# Patient Record
Sex: Female | Born: 1935 | Race: White | Hispanic: No | Marital: Married | Smoking: Never smoker
Health system: Southern US, Community
[De-identification: ages and names within clinical notes are randomized; demographics above are authoritative.]

## PROBLEM LIST (undated history)

## (undated) DIAGNOSIS — J449 Chronic obstructive pulmonary disease, unspecified: Secondary | ICD-10-CM

## (undated) DIAGNOSIS — F419 Anxiety disorder, unspecified: Secondary | ICD-10-CM

## (undated) DIAGNOSIS — J45909 Unspecified asthma, uncomplicated: Secondary | ICD-10-CM

## (undated) DIAGNOSIS — H9191 Unspecified hearing loss, right ear: Secondary | ICD-10-CM

## (undated) DIAGNOSIS — D333 Benign neoplasm of cranial nerves: Secondary | ICD-10-CM

## (undated) DIAGNOSIS — K219 Gastro-esophageal reflux disease without esophagitis: Secondary | ICD-10-CM

## (undated) DIAGNOSIS — S32000A Wedge compression fracture of unspecified lumbar vertebra, initial encounter for closed fracture: Secondary | ICD-10-CM

## (undated) DIAGNOSIS — Z66 Do not resuscitate: Secondary | ICD-10-CM

## (undated) DIAGNOSIS — M1990 Unspecified osteoarthritis, unspecified site: Secondary | ICD-10-CM

## (undated) DIAGNOSIS — I2510 Atherosclerotic heart disease of native coronary artery without angina pectoris: Secondary | ICD-10-CM

## (undated) DIAGNOSIS — I5042 Chronic combined systolic (congestive) and diastolic (congestive) heart failure: Secondary | ICD-10-CM

## (undated) DIAGNOSIS — E7800 Pure hypercholesterolemia, unspecified: Secondary | ICD-10-CM

## (undated) DIAGNOSIS — I219 Acute myocardial infarction, unspecified: Secondary | ICD-10-CM

## (undated) DIAGNOSIS — I10 Essential (primary) hypertension: Secondary | ICD-10-CM

## (undated) DIAGNOSIS — Z9289 Personal history of other medical treatment: Secondary | ICD-10-CM

## (undated) DIAGNOSIS — D510 Vitamin B12 deficiency anemia due to intrinsic factor deficiency: Secondary | ICD-10-CM

## (undated) DIAGNOSIS — N3020 Other chronic cystitis without hematuria: Secondary | ICD-10-CM

## (undated) DIAGNOSIS — R51 Headache: Secondary | ICD-10-CM

## (undated) DIAGNOSIS — J42 Unspecified chronic bronchitis: Secondary | ICD-10-CM

## (undated) DIAGNOSIS — J189 Pneumonia, unspecified organism: Secondary | ICD-10-CM

## (undated) HISTORY — PX: CHOLECYSTECTOMY: SHX55

## (undated) HISTORY — DX: Atherosclerotic heart disease of native coronary artery without angina pectoris: I25.10

## (undated) HISTORY — PX: VESICOVAGINAL FISTULA CLOSURE W/ TAH: SUR271

## (undated) HISTORY — DX: Unspecified asthma, uncomplicated: J45.909

## (undated) HISTORY — DX: Chronic combined systolic (congestive) and diastolic (congestive) heart failure: I50.42

## (undated) HISTORY — PX: APPENDECTOMY: SHX54

## (undated) HISTORY — PX: CATARACT EXTRACTION, BILATERAL: SHX1313

## (undated) HISTORY — PX: INGUINAL HERNIA REPAIR: SUR1180

---
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
## 1959-07-09 HISTORY — PX: ABDOMINAL SURGERY: SHX537

## 1979-07-09 HISTORY — PX: BREAST BIOPSY: SHX20

## 1979-07-09 HISTORY — PX: KNEE ARTHROSCOPY: SHX127

## 1989-07-08 HISTORY — PX: FEMORAL-POPLITEAL BYPASS GRAFT: SHX937

## 1998-04-07 ENCOUNTER — Other Ambulatory Visit: Admission: RE | Admit: 1998-04-07 | Discharge: 1998-04-07 | Payer: Self-pay | Admitting: Gastroenterology

## 1998-04-08 ENCOUNTER — Ambulatory Visit (HOSPITAL_COMMUNITY): Admission: RE | Admit: 1998-04-08 | Discharge: 1998-04-08 | Payer: Self-pay | Admitting: Obstetrics and Gynecology

## 1998-07-30 ENCOUNTER — Other Ambulatory Visit: Admission: RE | Admit: 1998-07-30 | Discharge: 1998-07-30 | Payer: Self-pay | Admitting: *Deleted

## 1999-08-04 ENCOUNTER — Other Ambulatory Visit: Admission: RE | Admit: 1999-08-04 | Discharge: 1999-08-04 | Payer: Self-pay | Admitting: Obstetrics and Gynecology

## 2000-01-10 ENCOUNTER — Encounter: Admission: RE | Admit: 2000-01-10 | Discharge: 2000-01-10 | Payer: Self-pay | Admitting: Gastroenterology

## 2000-01-10 ENCOUNTER — Encounter: Payer: Self-pay | Admitting: Gastroenterology

## 2000-05-05 ENCOUNTER — Encounter: Admission: RE | Admit: 2000-05-05 | Discharge: 2000-05-05 | Payer: Self-pay | Admitting: Obstetrics and Gynecology

## 2000-05-05 ENCOUNTER — Encounter: Payer: Self-pay | Admitting: Obstetrics and Gynecology

## 2000-08-14 ENCOUNTER — Other Ambulatory Visit: Admission: RE | Admit: 2000-08-14 | Discharge: 2000-08-14 | Payer: Self-pay | Admitting: Obstetrics and Gynecology

## 2000-11-14 ENCOUNTER — Encounter: Payer: Self-pay | Admitting: Gastroenterology

## 2000-11-14 ENCOUNTER — Encounter: Admission: RE | Admit: 2000-11-14 | Discharge: 2000-11-14 | Payer: Self-pay | Admitting: Gastroenterology

## 2001-05-31 ENCOUNTER — Encounter: Payer: Self-pay | Admitting: Gastroenterology

## 2001-05-31 ENCOUNTER — Encounter: Admission: RE | Admit: 2001-05-31 | Discharge: 2001-05-31 | Payer: Self-pay | Admitting: Gastroenterology

## 2001-09-21 ENCOUNTER — Other Ambulatory Visit: Admission: RE | Admit: 2001-09-21 | Discharge: 2001-09-21 | Payer: Self-pay | Admitting: Obstetrics and Gynecology

## 2002-02-07 ENCOUNTER — Ambulatory Visit (HOSPITAL_COMMUNITY): Admission: RE | Admit: 2002-02-07 | Discharge: 2002-02-07 | Payer: Self-pay | Admitting: Gastroenterology

## 2002-05-04 ENCOUNTER — Emergency Department (HOSPITAL_COMMUNITY): Admission: EM | Admit: 2002-05-04 | Discharge: 2002-05-04 | Payer: Self-pay

## 2002-05-09 ENCOUNTER — Encounter: Admission: RE | Admit: 2002-05-09 | Discharge: 2002-05-09 | Payer: Self-pay | Admitting: Gastroenterology

## 2002-05-09 ENCOUNTER — Encounter: Payer: Self-pay | Admitting: Gastroenterology

## 2002-05-28 ENCOUNTER — Encounter: Admission: RE | Admit: 2002-05-28 | Discharge: 2002-05-28 | Payer: Self-pay | Admitting: Gastroenterology

## 2002-05-28 ENCOUNTER — Encounter: Payer: Self-pay | Admitting: Gastroenterology

## 2002-06-12 ENCOUNTER — Encounter: Payer: Self-pay | Admitting: Surgery

## 2002-06-12 ENCOUNTER — Encounter: Admission: RE | Admit: 2002-06-12 | Discharge: 2002-06-12 | Payer: Self-pay | Admitting: Surgery

## 2002-07-24 ENCOUNTER — Ambulatory Visit (HOSPITAL_COMMUNITY): Admission: RE | Admit: 2002-07-24 | Discharge: 2002-07-24 | Payer: Self-pay | Admitting: Gastroenterology

## 2003-06-04 ENCOUNTER — Encounter: Admission: RE | Admit: 2003-06-04 | Discharge: 2003-06-04 | Payer: Self-pay | Admitting: Gastroenterology

## 2003-06-04 ENCOUNTER — Encounter: Payer: Self-pay | Admitting: Gastroenterology

## 2003-10-18 ENCOUNTER — Emergency Department (HOSPITAL_COMMUNITY): Admission: EM | Admit: 2003-10-18 | Discharge: 2003-10-18 | Payer: Self-pay | Admitting: *Deleted

## 2003-12-17 ENCOUNTER — Encounter: Admission: RE | Admit: 2003-12-17 | Discharge: 2003-12-17 | Payer: Self-pay | Admitting: Gastroenterology

## 2004-02-03 ENCOUNTER — Other Ambulatory Visit: Admission: RE | Admit: 2004-02-03 | Discharge: 2004-02-03 | Payer: Self-pay | Admitting: Obstetrics and Gynecology

## 2004-06-30 ENCOUNTER — Encounter: Admission: RE | Admit: 2004-06-30 | Discharge: 2004-06-30 | Payer: Self-pay | Admitting: Gastroenterology

## 2005-02-24 ENCOUNTER — Other Ambulatory Visit: Admission: RE | Admit: 2005-02-24 | Discharge: 2005-02-24 | Payer: Self-pay | Admitting: Obstetrics and Gynecology

## 2005-05-16 ENCOUNTER — Observation Stay (HOSPITAL_COMMUNITY): Admission: RE | Admit: 2005-05-16 | Discharge: 2005-05-17 | Payer: Self-pay | Admitting: Urology

## 2005-05-16 HISTORY — PX: BLADDER SUSPENSION: SHX72

## 2005-07-28 ENCOUNTER — Emergency Department (HOSPITAL_COMMUNITY): Admission: EM | Admit: 2005-07-28 | Discharge: 2005-07-28 | Payer: Self-pay | Admitting: Emergency Medicine

## 2005-09-21 ENCOUNTER — Encounter: Admission: RE | Admit: 2005-09-21 | Discharge: 2005-09-21 | Payer: Self-pay | Admitting: Gastroenterology

## 2006-10-05 ENCOUNTER — Encounter: Admission: RE | Admit: 2006-10-05 | Discharge: 2006-10-05 | Payer: Self-pay | Admitting: Gastroenterology

## 2006-12-01 ENCOUNTER — Ambulatory Visit: Payer: Self-pay | Admitting: Pulmonary Disease

## 2006-12-01 LAB — CONVERTED CEMR LAB
Hemoglobin: 12.7 g/dL (ref 12.0–15.0)
MCV: 84 fL (ref 78.0–100.0)
WBC: 5.5 10*3/uL (ref 4.5–10.5)

## 2006-12-04 ENCOUNTER — Ambulatory Visit: Payer: Self-pay | Admitting: Pulmonary Disease

## 2007-08-21 ENCOUNTER — Encounter: Admission: RE | Admit: 2007-08-21 | Discharge: 2007-08-21 | Payer: Self-pay | Admitting: Gastroenterology

## 2007-10-10 ENCOUNTER — Encounter: Admission: RE | Admit: 2007-10-10 | Discharge: 2007-10-10 | Payer: Self-pay | Admitting: Gastroenterology

## 2008-08-23 ENCOUNTER — Observation Stay (HOSPITAL_COMMUNITY): Admission: EM | Admit: 2008-08-23 | Discharge: 2008-08-24 | Payer: Self-pay | Admitting: Emergency Medicine

## 2008-10-13 ENCOUNTER — Encounter: Admission: RE | Admit: 2008-10-13 | Discharge: 2008-10-13 | Payer: Self-pay | Admitting: Gastroenterology

## 2008-11-26 ENCOUNTER — Ambulatory Visit: Payer: Self-pay | Admitting: Vascular Surgery

## 2008-11-26 ENCOUNTER — Encounter (INDEPENDENT_AMBULATORY_CARE_PROVIDER_SITE_OTHER): Payer: Self-pay | Admitting: Gastroenterology

## 2008-11-26 ENCOUNTER — Ambulatory Visit (HOSPITAL_COMMUNITY): Admission: RE | Admit: 2008-11-26 | Discharge: 2008-11-26 | Payer: Self-pay | Admitting: Gastroenterology

## 2009-10-14 ENCOUNTER — Encounter: Admission: RE | Admit: 2009-10-14 | Discharge: 2009-10-14 | Payer: Self-pay | Admitting: Gastroenterology

## 2010-01-04 ENCOUNTER — Encounter: Admission: RE | Admit: 2010-01-04 | Discharge: 2010-01-04 | Payer: Self-pay | Admitting: Gastroenterology

## 2010-03-27 ENCOUNTER — Emergency Department (HOSPITAL_COMMUNITY): Admission: EM | Admit: 2010-03-27 | Discharge: 2010-03-27 | Payer: Self-pay | Admitting: Emergency Medicine

## 2010-11-07 HISTORY — PX: CARDIAC CATHETERIZATION: SHX172

## 2010-12-02 ENCOUNTER — Encounter
Admission: RE | Admit: 2010-12-02 | Discharge: 2010-12-02 | Payer: Self-pay | Source: Home / Self Care | Attending: Gastroenterology | Admitting: Gastroenterology

## 2011-01-24 LAB — CBC
Hemoglobin: 11.7 g/dL — ABNORMAL LOW (ref 12.0–15.0)
MCHC: 34.5 g/dL (ref 30.0–36.0)
MCV: 81.3 fL (ref 78.0–100.0)
WBC: 4.6 10*3/uL (ref 4.0–10.5)

## 2011-01-24 LAB — DIFFERENTIAL
Basophils Absolute: 0 10*3/uL (ref 0.0–0.1)
Basophils Relative: 1 % (ref 0–1)
Eosinophils Relative: 2 % (ref 0–5)
Lymphocytes Relative: 23 % (ref 12–46)
Monocytes Relative: 11 % (ref 3–12)

## 2011-01-24 LAB — BASIC METABOLIC PANEL
BUN: 14 mg/dL (ref 6–23)
Calcium: 10.1 mg/dL (ref 8.4–10.5)
Creatinine, Ser: 0.9 mg/dL (ref 0.4–1.2)
GFR calc Af Amer: >60 mL/min (ref >60)
GFR calc non Af Amer: >60 mL/min (ref >60)
Glucose, Bld: 116 mg/dL — ABNORMAL HIGH (ref 70–99)
Sodium: 135 mEq/L (ref 135–145)

## 2011-02-02 ENCOUNTER — Emergency Department (HOSPITAL_COMMUNITY): Payer: Medicare Other

## 2011-02-02 ENCOUNTER — Observation Stay (HOSPITAL_COMMUNITY)
Admission: EM | Admit: 2011-02-02 | Discharge: 2011-02-03 | Disposition: A | Discharge status: Home / Self Care | Payer: Medicare Other | Attending: Interventional Cardiology | Admitting: Interventional Cardiology

## 2011-02-02 DIAGNOSIS — I2510 Atherosclerotic heart disease of native coronary artery without angina pectoris: Principal | ICD-10-CM | POA: Insufficient documentation

## 2011-02-02 DIAGNOSIS — I70409 Unspecified atherosclerosis of autologous vein bypass graft(s) of the extremities, unspecified extremity: Secondary | ICD-10-CM | POA: Insufficient documentation

## 2011-02-02 DIAGNOSIS — D649 Anemia, unspecified: Secondary | ICD-10-CM | POA: Insufficient documentation

## 2011-02-02 DIAGNOSIS — Z7902 Long term (current) use of antithrombotics/antiplatelets: Secondary | ICD-10-CM | POA: Insufficient documentation

## 2011-02-02 DIAGNOSIS — Z79899 Other long term (current) drug therapy: Secondary | ICD-10-CM | POA: Insufficient documentation

## 2011-02-02 DIAGNOSIS — I10 Essential (primary) hypertension: Secondary | ICD-10-CM | POA: Insufficient documentation

## 2011-02-02 DIAGNOSIS — R0602 Shortness of breath: Secondary | ICD-10-CM | POA: Insufficient documentation

## 2011-02-02 DIAGNOSIS — E7800 Pure hypercholesterolemia, unspecified: Secondary | ICD-10-CM | POA: Insufficient documentation

## 2011-02-02 DIAGNOSIS — E785 Hyperlipidemia, unspecified: Secondary | ICD-10-CM | POA: Insufficient documentation

## 2011-02-02 LAB — BASIC METABOLIC PANEL
Chloride: 99 mEq/L (ref 96–112)
GFR calc Af Amer: >60 mL/min (ref >60)
Potassium: 3.5 mEq/L (ref 3.5–5.1)

## 2011-02-02 LAB — CBC
Hemoglobin: 12.1 g/dL (ref 12.0–15.0)
MCHC: 32.9 g/dL (ref 30.0–36.0)
MCHC: 33.7 g/dL (ref 30.0–36.0)
MCV: 85.5 fL (ref 78.0–100.0)
MCV: 87.3 fL (ref 78.0–100.0)

## 2011-02-02 LAB — POCT CARDIAC MARKERS
CKMB, poc: 1.4 ng/mL (ref 1.0–8.0)
CKMB, poc: <1 ng/mL — ABNORMAL LOW (ref 1.0–8.0)
Myoglobin, poc: 105 ng/mL (ref 12–200)
Myoglobin, poc: 68.3 ng/mL (ref 12–200)
Troponin i, poc: <0.05 ng/mL (ref 0.00–0.09)

## 2011-02-02 LAB — TROPONIN I: Troponin I: 0.01 ng/mL (ref 0.00–0.06)

## 2011-02-02 LAB — CARDIAC PANEL(CRET KIN+CKTOT+MB+TROPI)
Total CK: 241 U/L — ABNORMAL HIGH (ref 7–177)
Troponin I: 0.01 ng/mL (ref 0.00–0.06)

## 2011-02-02 LAB — HEPARIN LEVEL (UNFRACTIONATED): Heparin Unfractionated: 0.51 IU/mL (ref 0.30–0.70)

## 2011-02-02 LAB — CK TOTAL AND CKMB (NOT AT ARMC)
Relative Index: INVALID (ref 0.0–2.5)
Total CK: 98 U/L (ref 7–177)

## 2011-02-03 LAB — BASIC METABOLIC PANEL
BUN: 14 mg/dL (ref 6–23)
Calcium: 10 mg/dL (ref 8.4–10.5)
GFR calc non Af Amer: 56 mL/min — ABNORMAL LOW (ref >60)
Potassium: 3.7 mEq/L (ref 3.5–5.1)
Sodium: 139 mEq/L (ref 135–145)

## 2011-02-03 LAB — PROTIME-INR
INR: 0.87 (ref 0.00–1.49)
Prothrombin Time: 12 seconds (ref 11.6–15.2)

## 2011-02-03 LAB — CBC
HCT: 38.8 % (ref 36.0–46.0)
Hemoglobin: 13 g/dL (ref 12.0–15.0)
RBC: 4.41 MIL/uL (ref 3.87–5.11)
WBC: 5.5 10*3/uL (ref 4.0–10.5)

## 2011-02-03 LAB — HEPARIN LEVEL (UNFRACTIONATED): Heparin Unfractionated: 0.33 IU/mL (ref 0.30–0.70)

## 2011-02-03 LAB — CARDIAC PANEL(CRET KIN+CKTOT+MB+TROPI)
CK, MB: 2.8 ng/mL (ref 0.3–4.0)
Relative Index: 1.5 (ref 0.0–2.5)
Troponin I: 0.02 ng/mL (ref 0.00–0.06)

## 2011-02-23 NOTE — Discharge Summary (Signed)
  NAME:  [name], [name]             ACCOUNT NO.:  [id number]  MEDICAL RECORD NO.:  [id number]           PATIENT TYPE:  O  LOCATION:  6522                         FACILITY:  [facility]  PHYSICIAN:  Corky Crafts, MDDATE OF BIRTH:  07/01/1926  DATE OF ADMISSION:  02/02/2011 DATE OF DISCHARGE:  02/03/2011                              DISCHARGE SUMMARY   PRIMARY CARE PHYSICIAN:  Danise Edge, MD  FINAL DIAGNOSES: 1. Coronary artery disease. 2. Hyperlipidemia.  PROCEDURE PERFORMED:  Left heart catheterization on February 03, 2011.  HOSPITAL COURSE:  The patient was admitted with unstable angina.  She ruled out for MI.  She underwent catheterization which showed a chronically occluded mid LAD.  She had left-to-left and right-to-left collaterals.  She had normal left ventricular function.  Her chest pain resolved.  She was started on Imdur.  Her blood pressure was stable. Her cath was done from the radial approach and she had no bleeding issues.  DISCHARGE MEDICATIONS: 1. Diazepam 5 mg p.o. daily p.r.n. 2. Ranitidine 150 mg p.o. b.i.d. 3. Chlorthalidone 12.5 mg daily. 4. Lovastatin 20 mg daily. 5. Iron 65 mg daily. 6. vitamin D3 1000 international units daily. 7. Imdur 30 mg daily. 8. Sublingual nitroglycerin p.r.n. 9. Aspirin 81 mg daily.  FOLLOWUP:  With Dr. Eldridge Dace in February 28, 2011, at 2 p.m.  Lab work showed BNP of 65, creatinine of 0.95.  FOLLOWUP ACTIVITY:  Followup post radial cath instructions, otherwise, as tolerated.  DIET:  Low-salt, heart-healthy diet.    Corky Crafts, MD    JSV/MEDQ  D:  02/03/2011  T:  02/04/2011  Job:  [id number]  Electronically Signed by Lance Muss MD on 02/23/2011 02:24:23 PM

## 2011-02-23 NOTE — H&P (Signed)
NAME:  [name], [name]             ACCOUNT NO.:  [id number]  MEDICAL RECORD NO.:  [id number]           PATIENT TYPE:  I  LOCATION:  2012                         FACILITY:  [facility]  PHYSICIAN:  Corky Crafts, MDDATE OF BIRTH:  May 03, 1926  DATE OF ADMISSION:  02/02/2011 DATE OF DISCHARGE:                             HISTORY & PHYSICAL   REASON FOR ADMISSION:  Chest discomfort.  HISTORY OF PRESENT ILLNESS:  The patient is an 77 year old woman who has known peripheral vascular disease.  She had a fem-fem bypass many years ago.  She reported a severe pressure in her upper chest, which radiated to the left side of her face and down her left arm that started this morning.  She took two aspirin with some minimal relief.  However, the intensity of the pain increased again, so her husband brought her to the hospital and she has been evaluated in the emergency room.  She has had an ECG, which does not show any acute ischemia.  Her initial cardiac markers were negative.  She received some nitroglycerin and morphine and this helped reduce the intensity of the pain but it is not completely gone at this point.  She has been started on IV heparin.  We are asked to further evaluate.  She has never had pain like this before.  She does some walking at home and this has not brought on chest pain in the past. She does not recall having any type of cardiac evaluation in the past.  PAST MEDICAL HISTORY: 1. Hypercholesterolemia. 2. Hypertension. 3. Thyroid disease. 4. Pernicious anemia. 5. Peripheral vascular disease.  HOME MEDICATIONS: 1. Omeprazole 20 mg daily. 2. Lovastatin 20 mg daily. 3. Vitamin D. 4. Calcium. 5. Flexeril p.r.n. 6. Diazepam p.r.n. 7. Chlorthalidone 25 mg daily.  ALLERGIES:  SULFA DRUGS.  PAST SURGICAL HISTORY:  She has had a hysterectomy, tonsillectomy, appendectomy and fem-fem bypass many years ago.  FAMILY HISTORY:  Significant for peripheral vascular disease as  well as coronary artery disease.  SOCIAL HISTORY:  She does not smoke or drink.  She is married.  Her daughter is a Engineer, civil (consulting).  REVIEW OF SYSTEMS:  Significant for the chest discomfort.  She also had some associated nausea.  No diaphoresis.  No sweating.  No focal weakness.  No rash.  All other systems negative unless mentioned above.  PHYSICAL EXAM:  VITAL SIGNS:  Blood pressure 114/70, heart rate of 18. GENERAL:  She is awake, alert, no apparent distress. HEAD:  Normocephalic, atraumatic. EYES:  Extraocular movements is intact. NECK:  No JVD. CARDIOVASCULAR:  Regular rate and rhythm.  S1, S2. LUNGS:  Clear to auscultation bilaterally. ABDOMEN:  Soft, nontender, nondistended. EXTREMITIES:  No edema. NEURO:  No focal, motor or sensory deficits. VASCULAR:  3+ radial pulse bilaterally, 2+ right femoral pulse, 1+ left femoral pulse.  LABORATORY DATA:  Troponin negative, creatinine 0.86.  Hemoglobin 12.8, white blood cell 5.3, platelets 203.  ECG shows normal sinus rhythm with nonspecific ST-T wave changes diffusely, no significant change from prior ECG done in 2011 chest x-ray shows no active disease.  ASSESSMENT/PLAN:  An 77 year old with known peripheral vascular  disease who has symptoms worrisome for unstable angina.  PLAN: 1. Cardiac, should be heparinized, rule out for MI with enzymes.  She     will be watched on telemetry.  We discussed cath or stress test     given the fact that she has persistent pain that is not going away.     We will plan for cardiac catheterization.  The approach will be     from the radial given that she has had peripheral vascular disease. 2. We will have to restart lipid lowering therapy. 3. Blood pressure currently well controlled.     Corky Crafts, MD     JSV/MEDQ  D:  02/02/2011  T:  02/03/2011  Job:  [id number]  Electronically Signed by Lance Muss MD on 02/23/2011 02:24:39 PM

## 2011-02-23 NOTE — Cardiovascular Report (Signed)
NAME:  [name], [name]             ACCOUNT NO.:  [id number]  MEDICAL RECORD NO.:  [id number]           PATIENT TYPE:  O  LOCATION:  6522                         FACILITY:  [facility]  PHYSICIAN:  Corky Crafts, MDDATE OF BIRTH:  02/16/1926  DATE OF PROCEDURE:  02/03/2011 DATE OF DISCHARGE:  02/03/2011                           CARDIAC CATHETERIZATION   PRIMARY CARE PHYSICIAN:  Danise Edge, MD  PROCEDURE PERFORMED:  Left heart catheterization, left ventriculogram, coronary angiogram, abdominal aortogram.  OPERATOR:  Corky Crafts, MD  INDICATIONS:  Unstable angina.  PROCEDURE/NARRATIVE:  The risks and benefits of cardiac catheterization were explained to the patient.  Informed consent was obtained.  She was brought to the cath lab.  She was prepped and draped in the usual sterile fashion.  Her right wrist was infiltrated with 1% lidocaine.  A 5-French glide sheath was placed into the right radial artery using modified Seldinger technique.  Right coronary artery angiography was performed using a JR-4 pigtail catheter.  The catheter was advanced to the vessel ostium under fluoroscopic guidance.  Digital angiography was performed in multiple projections using hand injection of contrast. Left coronary artery angiography was then performed using a JL-3.5 catheter in a similar fashion.  The pigtail catheter was advanced to the ascending aorta and across the aortic valve under fluoroscopic guidance. Power injection of contrast performed in the RAO projection.  Image of the left ventricle catheter was pulled back under continuous hemodynamic pressure monitoring.  Catheter was then advanced to the abdominal aorta and a power injection of contrast was performed in the AP projection to image the infrarenal aorta and iliac system.  FINDINGS:  The right coronary artery is a large dominant vessel.  There are mild luminal irregularities.  There is a 25% focal lesion in  the proximal right coronary artery.  PDA is a large vessel which is widely patent.  The posterolateral artery is a medium-sized vessel which is widely patent.  There are faint right-to-left collaterals noted that appear to be going to the LAD. The left circumflex is a large vessel.  There are mild luminal irregularities.  There is a large OM-1 and OM-2, which both appear widely patent.  There is approximately 25% stenosis on the mid circumflex just before these two obtuse marginal. Left anterior descending is heavily calcified in the proximal vessel. There is stenosis up to 70% at the midvessel.  There is complete occlusion.  There are faint left-to-left collaterals filling the distal LAD.  The first and second diagonals were small, but patent. Left ventriculogram shows mild anterior hypokinesis, but the overall left ventricular ejection fraction is still 55%.  HEMODYNAMIC RESULTS:  Left ventricular pressure 156/5 with an LVEDP of 11 mmHg.  Aortic pressure 155/87 with a mean aortic pressure of 120 mmHg. The abdominal aortogram shows no abdominal aortic aneurysm.  There is mild plaque in bilateral iliac arteries.  The fem-fem graft appears occluded.  IMPRESSION: 1. Chronic total occlusion of the left anterior descending coronary     artery with both right-to-left and left-to-left collaterals. 2. Overall normal left ventricular function with only mild anterior     hypokinesis.  3. No abdominal aortic aneurysm. 4. Occluded fem-fem graft. 5. No aortic stenosis, normal hemodynamics.  RECOMMENDATIONS:  We will treat the patient medically in terms of the antianginals and lipid-lowering therapy only if she had refractory symptoms would we consider revascularization as this occlusion looked like it has been present for quite some time.     Corky Crafts, MD     JSV/MEDQ  D:  02/03/2011  T:  02/04/2011  Job:  [id number]  cc:   Danise Edge, M.D.  Electronically Signed by  Lance Muss MD on 02/23/2011 02:24:42 PM

## 2011-03-22 NOTE — H&P (Signed)
NAME:  [name], [name]             ACCOUNT NO.:  [id number]   MEDICAL RECORD NO.:  [id number]          PATIENT TYPE:  EMS   LOCATION:  MAJO                         FACILITY:  [facility]   PHYSICIAN:  Hollice Espy, M.D.DATE OF BIRTH:  1925/11/17   DATE OF ADMISSION:  08/23/2008  DATE OF DISCHARGE:                              HISTORY & PHYSICAL   PRIMARY CARE PHYSICIAN:  Danise Edge, M.D.   CHIEF COMPLAINT:  Nausea, vomiting.   HISTORY OF PRESENT ILLNESS:  The patient is an 77 year old white female  with past history of hypertension who for the last several days has had  complaints of midepigastric abdominal pain causing nausea and vomiting.  It has gotten to the point where she just felt so bad that she could not  take it anymore and came into the emergency room for further evaluation.  In the emergency room on history, she gave complaints also of some left  shoulder pain with some radiation down her left arm causing numbness.  Given these reports, it was concern about she was having some atypical  cardiac event versus GI process.  Lipase level was checked and it was  found to be normal.  The rest of her labs were unremarkable except for a  slightly low potassium of 3 and normal cardiac markers.  An EKG was done  which showed a normal sinus rhythm, first-degree AV block, left axis  deviation.  When compared to previous other films, this is essentially  normal.  The patient had a chest x-ray which showed no active lung  disease.  Currently, the patient has received medication for pain.   REVIEW OF SYSTEMS:  She denies any headaches, vision changes, dysphagia,  chest pain, palpitations, shortness of breath, wheeze, cough, no current  abdominal pain.  No hematuria, dysuria, constipation, diarrhea, focal  history of numbness weakness or pain.  Review of systems otherwise  negative.   PAST MEDICAL HISTORY:  1. Hypertension.  2. Hyperlipidemia.   MEDICATIONS:  1. Lovastatin  20.  2. HCTZ 25.  3. Keflex 250 p.o. 4 times a day.  4. Ranitidine 150 daily.  5. Trental 400 t.i.d.  6. Iron 50.  7. Vitamin C 500.   ALLERGIES:  SULFA.   SOCIAL HISTORY:  No tobacco, alcohol or drug use.   FAMILY HISTORY:  Noncontributory.   PHYSICAL EXAMINATION:  VITAL SIGNS on admission:  Temperature 97.9,  heart rate 94, blood pressure 171/94, respirations 18, O2 sat 97% on  room air.  After receiving pain medication, she is down to 118/61.  GENERAL:  She is alert and oriented x3 in no apparent distress.  HEENT:  Normocephalic atraumatic.  Mucous membranes are moist.  NECK:  She has no carotid bruits.  HEART:  Regular rate and rhythm, S1-S2.  LUNGS:  Clear to auscultation bilaterally.  ABDOMEN:  Soft, nontender, nondistended, but with some hypoactive and  high-pitched bowel sounds.  EXTREMITIES:  Show no clubbing, cyanosis or edema.   LABORATORY DATA:  Lipase level 38, sodium 140, potassium 3, chloride  103, bicarb 30, BUN 14, creatinine  0.7, glucose 95.  LFTs are  completely normal.  White count 5.6, H&H 13.6 and 40, MCV of 89,  platelet count 219.   ASSESSMENT/PLAN:  1. Nausea, vomiting.  2. Midepigastric pain.  3. Left shoulder pain with arm radiation.  Question if this is an      atypical cardiac event.  Check enzymes x3, plus getting a stress      test.  We will also check a two-view abdominal x-ray.  4. 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  We will control with      pain medications.  Continue to monitor.      Hollice Espy, M.D.  Electronically Signed     SKK/MEDQ  D:  08/23/2008  T:  08/23/2008  Job:  [id number]   cc:   Danise Edge, M.D.

## 2011-03-22 NOTE — Discharge Summary (Signed)
[name], [name]             ACCOUNT NO.:  [id number]   MEDICAL RECORD NO.:  [id number]          PATIENT TYPE:  INP   LOCATION:  5507                         FACILITY:  [facility]   PHYSICIAN:  Candyce Churn, M.D.DATE OF BIRTH:  01-31-1926   DATE OF ADMISSION:  08/23/2008  DATE OF DISCHARGE:  08/24/2008                               DISCHARGE SUMMARY   DISCHARGE DIAGNOSES:  1. Probable gastritis, resolved.  Question etiology.  2. Left shoulder and left arm pain, resolved.  3. Hypertension, improved.  4. History of hyperlipidemia.  5. Recent urinary tract infection with recent cephalexin use, but      discontinued 4-5 days ago.  6. Apparent history of dysphagia, followed by Dr. Danise Edge and      possible gastroesophageal reflux disease.  7. Iron deficiency anemia in the past.   DISCHARGE MEDICATIONS:  1. Lovastatin 20 mg daily.  2. Maxzide 37.5/25 mg, one-half tablet daily.  New medication.  3. Ranitidine 150 mg p.o. b.i.d., increase from 150 mg daily.  4. Trental 400 mg t.i.d.  5. Hold iron and vitamin C therapy for now.   HOSPITAL COURSE:  Ms. [name] is a pleasant 77 year old female  with a history of hypertension and apparently troubled swallowing and  has been followed by Dr. Danise Edge.  She is on medications to  suggest that she has had iron deficiency anemia, possible GERD, and  hyperlipidemia.   She presented to the [facility] Room on August 23, 2008,  complaining of several days of nausea and vomiting, and midepigastric  abdominal pain.  No diarrhea.  She presented to the emergency room with  complaint of left shoulder pain with radiation down to left arm causing  numbness.  This has not recurred since admission.  She has had negative  cardiac enzymes x3 and chest x-ray revealed no acute findings and EKG  revealed sinus rhythm with first-degree AV block with moderate voltage  criteria for LVH and AVL and left axis deviation, but  no ST-segment  elevation or depression to suggest acute ischemia or injury.   After admission, she was observed for 24 hours and had no recurrent  shoulder or arm pain, shortness of breath, chest pain, fevers, chills,  nausea, or vomiting.  She was eating well at the time of discharge.   Laboratories while admitted were as follows:  CBC on August 23, 2008,  revealed a white count 5600, hemoglobin 13.6, and platelet count 219,000  and on subsequent day white count 5100, hemoglobin 13.0, platelet count  199,000.  Cardiac enzymes x3 drawn on August 23, 2008, were all within  normal limits with CK-MB either 1.1 or 1.0 x3.  Troponin was less than  0.05 and is less than 0.01 on two occasions.  Lipase on August 23, 2008, was 31, normal and CMET drawn on August 23, 2008, revealed sodium  140, potassium 3.0, chloride 103, bicarb 30, glucose 95, BUN 14,  creatinine 0.70.  CMET on discharge revealed potassium 4.2, up from 3.0  and that was likely secondary to her hydrochlorothiazide therapy which  will be changed to  Maxzide therapy which should not cause potassium  depletion.   Condition on discharge was improved, and she will see Dr. Danise Edge  in followup in several days to consider further cardiac workup and/or GI  workup.   She was notified to call us immediately if she starts to develop  recurrent symptoms prior to being seen in our office.      Candyce Churn, M.D.  Electronically Signed     RNG/MEDQ  D:  08/24/2008  T:  08/25/2008  Job:  [id number]

## 2011-03-25 NOTE — Op Note (Signed)
NAME:  [name], [name]             ACCOUNT NO.:  [id number]   MEDICAL RECORD NO.:  [id number]          PATIENT TYPE:  AMB   LOCATION:  DAY                          FACILITY:  [facility]   PHYSICIAN:  Maretta Bees. Vonita Moss, M.D.DATE OF BIRTH:  [birth year]   DATE OF PROCEDURE:  05/16/2005  DATE OF DISCHARGE:                                 OPERATIVE REPORT   PREOPERATIVE DIAGNOSIS:  Stress urinary incontinence.   POSTOPERATIVE DIAGNOSIS:  Stress urinary incontinence.   PROCEDURE:  Obturator sling.   SURGEON:  Dr. Larey Dresser.   ANESTHESIA:  General.   INDICATIONS:  This 77 year old white female has had significant problem with  stress urinary incontinence. She is been on Detrol LA without complete  relief. She has no significant cystocele. She has definite stress  incontinence and was counseled about an obturator sling and the risks of  infection, erosion, bladder injury, retention or infection.   DESCRIPTION OF PROCEDURE:  The patient was brought to the operating room,  placed in lithotomy position. The external genitalia and lower abdomen and  perineum were prepped and draped in the usual fashion. A Foley catheter was  placed per urethra. The Foley catheter balloon was easily palpable.  Xylocaine with epi was injected suburethrally and a mid urethral  longitudinal incision was made and dissected up laterally with the Struhle  scissors to the endopelvic fascia. Stab wounds were placed at the level of  the clitoris just over the obturator fossa. The curved [employer]  sling needle was manipulated down onto the obturator bone and back around  the backside to exit into the endopelvic fascia lateral to the urethra on  each side without perforation of the vaginal flap. The sling was brought up  on the left side with the needle on the right side. She was cystoscoped.  There was no evidence of bladder lesions, bladder injury, or urethral  injury. The sling was then placed in mid  urethra and the plastic coverings  removed and the sling was under no undue tension with a hemostat between the  Foley catheter which is now back in the bladder and the sling was cut off at  skin level and later Dermabond was placed over these stab wounds. The  vaginal incision then closed with running 2-0 Vicryl. The wound was packed  with Estrace coated packing and she was taken to recovery room in good  condition with the Foley catheter closed drainage having tolerated the  procedure well with estimated blood loss of 10 a 20 mL.     _________________    LJP/MEDQ  D:  05/16/2005  T:  05/16/2005  Job:  [id number]

## 2011-03-25 NOTE — Op Note (Signed)
   NAME:  [name], [name]                       ACCOUNT NO.:  [id number]   MEDICAL RECORD NO.:  [id number]                   PATIENT TYPE:  AMB   LOCATION:  ENDO                                 FACILITY:  [facility]   PHYSICIAN:  Danise Edge, MD                  DATE OF BIRTH:  26-Mar-1926   DATE OF PROCEDURE:  07/24/2002  DATE OF DISCHARGE:                                 OPERATIVE REPORT   PREOPERATIVE DIAGNOSIS:  Esophagogastroduodenoscopy.   INDICATIONS:  The patient is a 77 year old female born 06-Jan-2026.  On 06/21/02  the patient was evaluated by Velora Heckler, M.D.  The patient has  intermittent epigastric abdominal pain radiating to her back, suggesting  biliary colic, associated with normal liver enzymes, normal abdominal  ultrasound, and normal hepatobiliary scan with gallbladder ejection fraction  of 72%.  Her 02/07/02 proctocolonoscopy to the cecum was normal.  Her 1992  esophagogastroduodenoscopy was normal.   ENDOSCOPIST:  Danise Edge, M.D.   PREMEDICATION:  Versed 5 mg, Demerol 50 mg.   ENDOSCOPE:  Olympus gastroscope.   DESCRIPTION OF PROCEDURE:  After obtaining informed consent, the patient was  placed in the left lateral decubitus position.  I administered intravenous  Demerol and intravenous Versed to achieve conscious sedation for the  procedure.  The patient's blood pressure, oxygen saturation, and cardiac  rhythm were monitored throughout the procedure and documented in the medical  record.   The Olympus gastroscope was passed through the posterior hypopharynx into  the proximal esophagus without difficulty.  The hypopharynx, larynx, and  vocal cords appeared normal.   Esophagoscopy:  The proximal, mid-, and lower segments of the esophageal  mucosa appeared completely normal.  The squamocolumnar junction and  esophagogastric junction were noted at approximately 38 cm from the incisor  teeth.  Endoscopically there is no evidence for the presence of  Barrett's  esophagus, erosive esophagitis, esophageal mucosal scarring, or esophageal  ulceration.   Gastroscopy:  Retroflex view of the gastric cardia and fundus was normal.  The gastric body, antrum, and pylorus appear completely normal.   Duodenoscopy:  The duodenal bulb, mid-duodenum, and distal duodenum appear  completely normal.   ASSESSMENT:  Normal esophagogastroduodenoscopy.                                                 Danise Edge, MD    MJ/MEDQ  D:  07/24/2002  T:  07/25/2002  Job:  [id number]   cc:   Velora Heckler, M.D.  Fax: [phone]

## 2011-03-25 NOTE — Op Note (Signed)
[place]  Patient:    [name], [name] Visit Number: [id number] MRN: [id number]          Service Type: END Location: ENDO Attending Physician:  Dennison Bulla Ii Dictated by:   Verlin Grills, M.D. Proc. Date: 02/07/02 Admit Date:  02/07/2002 Discharge Date: 02/07/2002                             Operative Report  DATE OF BIRTH:  14-Oct-1926  REFERRING PHYSICIAN:  PROCEDURE PERFORMED:  Colonoscopy.  ENDOSCOPIST:  Verlin Grills, M.D.  INDICATIONS FOR PROCEDURE:  The patient is a 77 year old female.  She submitted stool hemoccult cards for testing to my office and two of six hemoccult cards were positive for blood.  In 1994 she underwent a proctocolonoscopy to the hepatic flexure followed by air contrast barium enema which was normal.  PREMEDICATION:  Versed 12.5 mg, fentanyl 100 mcg.  ENDOSCOPE:  Olympus video colonoscope.  DESCRIPTION OF PROCEDURE:  After obtaining informed consent, the patient was placed in the left lateral decubitus position.  I administered intravenous fentanyl and intravenous Versed to achieve conscious sedation for the procedure.  The patients blood pressure, oxygen saturation and cardiac rhythm were monitored throughout the procedure and documented in the medical record.  Anal inspection was normal.  Digital rectal exam was normal.  The Olympus pediatric video colonoscope was then introduced into the rectum and advanced to the cecum as identified by a normal-appearing ileocecal valve.  Colonic preparation for the exam today was excellent.  Rectum:  Normal.  Sigmoid colon and descending colon:  Normal.  Splenic flexure:  Normal.  Transverse colon:  Normal.  Hepatic flexure:  Normal.  Ascending colon:  Normal.  Cecum and ileocecal valve:  Normal.  ASSESSMENT:  Normal proctocolonoscopy to the cecum. Dictated by:   Verlin Grills, M.D. Attending Physician:  Dennison Bulla Ii DD:  02/07/02 TD:  02/07/02 Job: [id number]

## 2011-05-22 ENCOUNTER — Emergency Department (HOSPITAL_COMMUNITY)
Admission: EM | Admit: 2011-05-22 | Discharge: 2011-05-22 | Disposition: A | Discharge status: Home / Self Care | Payer: Medicare Other | Attending: Emergency Medicine | Admitting: Emergency Medicine

## 2011-05-22 ENCOUNTER — Emergency Department (HOSPITAL_COMMUNITY): Payer: Medicare Other

## 2011-05-22 DIAGNOSIS — S32010A Wedge compression fracture of first lumbar vertebra, initial encounter for closed fracture: Secondary | ICD-10-CM

## 2011-05-22 DIAGNOSIS — S32009A Unspecified fracture of unspecified lumbar vertebra, initial encounter for closed fracture: Secondary | ICD-10-CM | POA: Insufficient documentation

## 2011-05-22 DIAGNOSIS — Z79899 Other long term (current) drug therapy: Secondary | ICD-10-CM | POA: Insufficient documentation

## 2011-05-22 DIAGNOSIS — R195 Other fecal abnormalities: Secondary | ICD-10-CM | POA: Insufficient documentation

## 2011-05-22 DIAGNOSIS — W19XXXA Unspecified fall, initial encounter: Secondary | ICD-10-CM | POA: Insufficient documentation

## 2011-05-22 DIAGNOSIS — M549 Dorsalgia, unspecified: Secondary | ICD-10-CM | POA: Insufficient documentation

## 2011-05-22 HISTORY — DX: Essential (primary) hypertension: I10

## 2011-05-22 HISTORY — DX: Gastro-esophageal reflux disease without esophagitis: K21.9

## 2011-05-22 HISTORY — DX: Pure hypercholesterolemia, unspecified: E78.00

## 2011-05-22 LAB — HEMOCCULT GUIAC POC 1CARD (OFFICE)

## 2011-05-22 MED ORDER — HYDROCODONE-ACETAMINOPHEN 5-325 MG PO TABS: 1.0000 | ORAL_TABLET | ORAL | Status: AC | PRN

## 2011-05-22 MED ORDER — HYDROCODONE-ACETAMINOPHEN 5-325 MG PO TABS
2.0000 | ORAL_TABLET | Freq: Once | ORAL | Status: AC
  Administered 2011-05-22: 2 via ORAL
  Filled 2011-05-22: qty 2

## 2011-05-22 MED ORDER — IBUPROFEN 400 MG PO TABS
400.0000 mg | ORAL_TABLET | Freq: Once | ORAL | Status: AC
  Administered 2011-05-22: 400 mg via ORAL
  Filled 2011-05-22: qty 1

## 2011-05-22 NOTE — ED Notes (Signed)
Patient with no complaints at this time. Respirations even and unlabored. Skin warm/dry. Discharge instructions reviewed with patient at this time. Patient given opportunity to voice concerns/ask questions. Patient discharged at this time and left Emergency Department with steady gait.   

## 2011-05-22 NOTE — ED Notes (Signed)
Pt arrived by ems, reports pt was walking to restroom and fell.  Doesn't know what made her fall.  C/O lower back pain.  Denies any LOC.

## 2011-05-22 NOTE — ED Provider Notes (Addendum)
History     Chief Complaint  Patient presents with  . Fall  . Back Pain   Patient is a 77 y.o. female presenting with fall and back pain. The history is provided by the patient and the EMS personnel.  Fall The accident occurred less than 1 hour ago. The fall occurred while walking and while standing. She fell from a height of 1 to 2 ft. She landed on a hard floor. There was no blood loss. The point of impact was the right hip. Pain location: Right-sided lumbar region, the patient denies right hip pain. The patient denies neck pain. The pain is moderate. She was not ambulatory at the scene. There was no entrapment after the fall. There was no drug use involved in the accident. There was no alcohol use involved in the accident. Pertinent negatives include no visual change, no numbness, no abdominal pain, no bowel incontinence, no nausea, no vomiting, no headaches, no loss of consciousness and no tingling. Exacerbated by: Movement and palpation. Treatment on scene includes a backboard and a c-collar.  Back Pain  Pertinent negatives include no chest pain, no numbness, no headaches, no abdominal pain, no bowel incontinence and no tingling.   the patient was ambulating at home to go to the bathroom, when she lost her balance and fell. She reports right lumbar back pain only and denies neck pain head injury distal weakness or numbness. She fell onto her right hip, but denies right hip pain. She is in no apparent distress.  No past medical history on file.  No past surgical history on file.  No family history on file.  History  Substance Use Topics  . Smoking status: Not on file  . Smokeless tobacco: Not on file  . Alcohol Use: Not on file    OB History    No data available      Review of Systems  HENT: Negative for hearing loss, nosebleeds, congestion, facial swelling, rhinorrhea, neck pain, neck stiffness and tinnitus.   Eyes: Negative for visual disturbance.  Respiratory: Negative.   Negative for chest tightness and shortness of breath.   Cardiovascular: Negative for chest pain and leg swelling.  Gastrointestinal: Negative for nausea, vomiting, abdominal pain and bowel incontinence.  Genitourinary: Negative for flank pain.  Musculoskeletal: Positive for back pain. Negative for joint swelling.  Skin: Negative for wound.  Neurological: Negative for dizziness, tingling, loss of consciousness, numbness and headaches.  Psychiatric/Behavioral: Negative.  Negative for confusion.    Physical Exam  BP 128/63  Pulse 82  Resp 18  SpO2 98%  Physical Exam  Nursing note and vitals reviewed. Constitutional: She is oriented to person, place, and time. She appears well-nourished. No distress.  HENT:  Head: Normocephalic and atraumatic. Head is without raccoon's eyes, without Battle's sign, without abrasion, without contusion and without laceration.  Right Ear: Hearing and external ear normal.  Left Ear: Hearing and external ear normal.  Nose: Nose normal. No nasal deformity, septal deviation or nasal septal hematoma.  Eyes: EOM are normal. Pupils are equal, round, and reactive to light.  Neck: Normal range of motion. Neck supple. No spinous process tenderness and no muscular tenderness present. Normal range of motion present.  Cardiovascular: Normal rate, regular rhythm and normal heart sounds.  Exam reveals no gallop and no friction rub.   No murmur heard. Pulmonary/Chest: Effort normal and breath sounds normal. No respiratory distress. She has no wheezes. She has no rales. She exhibits no tenderness.  Abdominal: Soft. Bowel  sounds are normal. She exhibits no distension. There is no tenderness. There is no guarding.  Musculoskeletal: Normal range of motion. She exhibits no edema and no tenderness.       Right hip: Normal. She exhibits normal range of motion, normal strength, no tenderness, no bony tenderness, no swelling and no deformity.  Neurological: She is alert and oriented  to person, place, and time. No cranial nerve deficit. Coordination normal.  Skin: Skin is warm and dry.  Psychiatric: She has a normal mood and affect.    ED Course  Procedures  MDM The patient sustained a fall from standing position at home with no head injury or loss of consciousness and no neck pain. She complains of right lumbar region pain only, and has no findings about the right hip, the point of impact, to suggest acute bony injury. I will obtain x-rays of the patient's lumbar spine especially given her history of prior compression fracture to assure that there is no fracture or dislocation present.      Felisa Bonier, MD 05/22/11 [phone]  X-rays have been reviewed by me as well as the reading radiologist, and show possible acute L1 compression fracture with approximately 10% loss of height of the superior endplate. It is uncertain if this is acute from this fall or if it is simply new since the last x-ray in 2009. However the patient's symptoms are managed with oral pain medication she is comfortable at this time, and she is amenable to outpatient followup as needed if she has persistent pain in her back.  Felisa Bonier, MD 05/22/11 [id number]

## 2011-07-01 ENCOUNTER — Inpatient Hospital Stay (HOSPITAL_COMMUNITY)
Admission: EM | Admit: 2011-07-01 | Discharge: 2011-07-05 | DRG: 641 | Disposition: A | Discharge status: Home Health | Payer: Medicare Other | Attending: Gastroenterology | Admitting: Gastroenterology

## 2011-07-01 ENCOUNTER — Emergency Department (HOSPITAL_COMMUNITY): Payer: Medicare Other

## 2011-07-01 DIAGNOSIS — D510 Vitamin B12 deficiency anemia due to intrinsic factor deficiency: Secondary | ICD-10-CM | POA: Diagnosis present

## 2011-07-01 DIAGNOSIS — Z9181 History of falling: Secondary | ICD-10-CM

## 2011-07-01 DIAGNOSIS — Z79899 Other long term (current) drug therapy: Secondary | ICD-10-CM

## 2011-07-01 DIAGNOSIS — I10 Essential (primary) hypertension: Secondary | ICD-10-CM | POA: Diagnosis present

## 2011-07-01 DIAGNOSIS — Y832 Surgical operation with anastomosis, bypass or graft as the cause of abnormal reaction of the patient, or of later complication, without mention of misadventure at the time of the procedure: Secondary | ICD-10-CM | POA: Diagnosis present

## 2011-07-01 DIAGNOSIS — T424X5A Adverse effect of benzodiazepines, initial encounter: Secondary | ICD-10-CM | POA: Diagnosis present

## 2011-07-01 DIAGNOSIS — R627 Adult failure to thrive: Principal | ICD-10-CM | POA: Diagnosis present

## 2011-07-01 DIAGNOSIS — I2582 Chronic total occlusion of coronary artery: Secondary | ICD-10-CM | POA: Diagnosis present

## 2011-07-01 DIAGNOSIS — E210 Primary hyperparathyroidism: Secondary | ICD-10-CM | POA: Diagnosis present

## 2011-07-01 DIAGNOSIS — I2510 Atherosclerotic heart disease of native coronary artery without angina pectoris: Secondary | ICD-10-CM | POA: Diagnosis present

## 2011-07-01 DIAGNOSIS — E559 Vitamin D deficiency, unspecified: Secondary | ICD-10-CM | POA: Diagnosis present

## 2011-07-01 DIAGNOSIS — K219 Gastro-esophageal reflux disease without esophagitis: Secondary | ICD-10-CM | POA: Diagnosis present

## 2011-07-01 DIAGNOSIS — T82898A Other specified complication of vascular prosthetic devices, implants and grafts, initial encounter: Secondary | ICD-10-CM | POA: Diagnosis present

## 2011-07-01 DIAGNOSIS — E785 Hyperlipidemia, unspecified: Secondary | ICD-10-CM | POA: Diagnosis present

## 2011-07-01 DIAGNOSIS — E876 Hypokalemia: Secondary | ICD-10-CM | POA: Diagnosis present

## 2011-07-01 DIAGNOSIS — Z7982 Long term (current) use of aspirin: Secondary | ICD-10-CM

## 2011-07-01 DIAGNOSIS — T502X5A Adverse effect of carbonic-anhydrase inhibitors, benzothiadiazides and other diuretics, initial encounter: Secondary | ICD-10-CM | POA: Diagnosis present

## 2011-07-01 DIAGNOSIS — I739 Peripheral vascular disease, unspecified: Secondary | ICD-10-CM | POA: Diagnosis present

## 2011-07-01 DIAGNOSIS — E860 Dehydration: Secondary | ICD-10-CM | POA: Diagnosis present

## 2011-07-01 LAB — POCT I-STAT, CHEM 8
Creatinine, Ser: 1 mg/dL (ref 0.50–1.10)
Glucose, Bld: 123 mg/dL — ABNORMAL HIGH (ref 70–99)
HCT: 46 % (ref 36.0–46.0)
Hemoglobin: 15.6 g/dL — ABNORMAL HIGH (ref 12.0–15.0)
Potassium: 2.5 mEq/L — CL (ref 3.5–5.1)
TCO2: 31 mmol/L (ref 0–100)

## 2011-07-01 LAB — CBC
Hemoglobin: 15.4 g/dL — ABNORMAL HIGH (ref 12.0–15.0)
MCH: 29.5 pg (ref 26.0–34.0)
MCHC: 36.6 g/dL — ABNORMAL HIGH (ref 30.0–36.0)
Platelets: 260 10*3/uL (ref 150–400)
RDW: 13 % (ref 11.5–15.5)

## 2011-07-01 LAB — URINALYSIS, ROUTINE W REFLEX MICROSCOPIC
Hgb urine dipstick: NEGATIVE
Ketones, ur: 15 mg/dL — AB
Specific Gravity, Urine: 1.015 (ref 1.005–1.030)
pH: 6.5 (ref 5.0–8.0)

## 2011-07-01 LAB — LIPASE, BLOOD: Lipase: 82 U/L — ABNORMAL HIGH (ref 11–59)

## 2011-07-01 LAB — DIFFERENTIAL
Basophils Absolute: 0 10*3/uL (ref 0.0–0.1)
Basophils Relative: 0 % (ref 0–1)
Eosinophils Absolute: 0 10*3/uL (ref 0.0–0.7)
Eosinophils Relative: 0 % (ref 0–5)
Monocytes Absolute: 1 10*3/uL (ref 0.1–1.0)
Monocytes Relative: 12 % (ref 3–12)
Neutro Abs: 5.4 10*3/uL (ref 1.7–7.7)

## 2011-07-01 LAB — COMPREHENSIVE METABOLIC PANEL
Albumin: 4.5 g/dL (ref 3.5–5.2)
Alkaline Phosphatase: 100 U/L (ref 39–117)
BUN: 15 mg/dL (ref 6–23)
Calcium: 11.9 mg/dL — ABNORMAL HIGH (ref 8.4–10.5)
Creatinine, Ser: 0.71 mg/dL (ref 0.50–1.10)
GFR calc Af Amer: >60 mL/min (ref >60)
Glucose, Bld: 119 mg/dL — ABNORMAL HIGH (ref 70–99)
Potassium: 2.5 mEq/L — CL (ref 3.5–5.1)
Total Protein: 7.6 g/dL (ref 6.0–8.3)

## 2011-07-01 LAB — PROTIME-INR
INR: 0.91 (ref 0.00–1.49)
Prothrombin Time: 12.4 seconds (ref 11.6–15.2)

## 2011-07-01 LAB — POCT I-STAT TROPONIN I: Troponin i, poc: 0.04 ng/mL (ref 0.00–0.08)

## 2011-07-02 LAB — COMPREHENSIVE METABOLIC PANEL
ALT: 8 U/L (ref 0–35)
BUN: 13 mg/dL (ref 6–23)
CO2: 30 mEq/L (ref 19–32)
Calcium: 10.8 mg/dL — ABNORMAL HIGH (ref 8.4–10.5)
GFR calc Af Amer: >60 mL/min (ref >60)
GFR calc non Af Amer: >60 mL/min (ref >60)
Glucose, Bld: 93 mg/dL (ref 70–99)
Sodium: 137 mEq/L (ref 135–145)

## 2011-07-02 LAB — CBC
MCH: 29.1 pg (ref 26.0–34.0)
MCHC: 35.4 g/dL (ref 30.0–36.0)
Platelets: 210 10*3/uL (ref 150–400)
RDW: 13.2 % (ref 11.5–15.5)

## 2011-07-02 LAB — CARDIAC PANEL(CRET KIN+CKTOT+MB+TROPI)
CK, MB: 3.8 ng/mL (ref 0.3–4.0)
CK, MB: 4.9 ng/mL — ABNORMAL HIGH (ref 0.3–4.0)
CK, MB: 4.9 ng/mL — ABNORMAL HIGH (ref 0.3–4.0)
Relative Index: INVALID (ref 0.0–2.5)
Relative Index: INVALID (ref 0.0–2.5)
Total CK: 38 U/L (ref 7–177)
Total CK: 39 U/L (ref 7–177)
Total CK: 41 U/L (ref 7–177)
Troponin I: <0.3 ng/mL (ref <0.30)

## 2011-07-02 LAB — URINE CULTURE: Culture  Setup Time: 201208242141

## 2011-07-02 LAB — PRO B NATRIURETIC PEPTIDE: Pro B Natriuretic peptide (BNP): 169.3 pg/mL (ref 0–450)

## 2011-07-02 LAB — LIPID PANEL
Cholesterol: 148 mg/dL (ref 0–200)
HDL: 32 mg/dL — ABNORMAL LOW (ref >39)

## 2011-07-02 LAB — PHOSPHORUS: Phosphorus: 2.6 mg/dL (ref 2.3–4.6)

## 2011-07-02 LAB — TSH: TSH: 2.808 u[IU]/mL (ref 0.350–4.500)

## 2011-07-03 LAB — CBC
HCT: 40.8 % (ref 36.0–46.0)
MCV: 83.3 fL (ref 78.0–100.0)
RBC: 4.9 MIL/uL (ref 3.87–5.11)
WBC: 5.4 10*3/uL (ref 4.0–10.5)

## 2011-07-03 LAB — BASIC METABOLIC PANEL
BUN: 7 mg/dL (ref 6–23)
CO2: 31 mEq/L (ref 19–32)
Chloride: 101 mEq/L (ref 96–112)
Creatinine, Ser: 0.66 mg/dL (ref 0.50–1.10)

## 2011-07-04 LAB — BASIC METABOLIC PANEL
BUN: 9 mg/dL (ref 6–23)
CO2: 30 mEq/L (ref 19–32)
Calcium: 11.1 mg/dL — ABNORMAL HIGH (ref 8.4–10.5)
Creatinine, Ser: 0.63 mg/dL (ref 0.50–1.10)
Glucose, Bld: 107 mg/dL — ABNORMAL HIGH (ref 70–99)

## 2011-07-05 LAB — BASIC METABOLIC PANEL
BUN: 14 mg/dL (ref 6–23)
Calcium: 11.3 mg/dL — ABNORMAL HIGH (ref 8.4–10.5)
GFR calc non Af Amer: >60 mL/min (ref >60)
Glucose, Bld: 99 mg/dL (ref 70–99)

## 2011-07-17 ENCOUNTER — Inpatient Hospital Stay (HOSPITAL_COMMUNITY)
Admission: EM | Admit: 2011-07-17 | Discharge: 2011-07-19 | DRG: 303 | Disposition: A | Discharge status: Home / Self Care | Payer: Medicare Other | Source: Ambulatory Visit | Attending: Interventional Cardiology | Admitting: Interventional Cardiology

## 2011-07-17 ENCOUNTER — Emergency Department (HOSPITAL_COMMUNITY): Payer: Medicare Other

## 2011-07-17 DIAGNOSIS — I70209 Unspecified atherosclerosis of native arteries of extremities, unspecified extremity: Secondary | ICD-10-CM | POA: Diagnosis present

## 2011-07-17 DIAGNOSIS — R0602 Shortness of breath: Secondary | ICD-10-CM

## 2011-07-17 DIAGNOSIS — I2582 Chronic total occlusion of coronary artery: Secondary | ICD-10-CM | POA: Diagnosis present

## 2011-07-17 DIAGNOSIS — E7800 Pure hypercholesterolemia, unspecified: Secondary | ICD-10-CM | POA: Diagnosis present

## 2011-07-17 DIAGNOSIS — Z882 Allergy status to sulfonamides status: Secondary | ICD-10-CM

## 2011-07-17 DIAGNOSIS — I2510 Atherosclerotic heart disease of native coronary artery without angina pectoris: Principal | ICD-10-CM | POA: Diagnosis present

## 2011-07-17 DIAGNOSIS — Z7902 Long term (current) use of antithrombotics/antiplatelets: Secondary | ICD-10-CM

## 2011-07-17 DIAGNOSIS — Z79899 Other long term (current) drug therapy: Secondary | ICD-10-CM

## 2011-07-17 DIAGNOSIS — Z96659 Presence of unspecified artificial knee joint: Secondary | ICD-10-CM

## 2011-07-17 DIAGNOSIS — Z7982 Long term (current) use of aspirin: Secondary | ICD-10-CM

## 2011-07-17 DIAGNOSIS — I10 Essential (primary) hypertension: Secondary | ICD-10-CM | POA: Diagnosis present

## 2011-07-17 DIAGNOSIS — K219 Gastro-esophageal reflux disease without esophagitis: Secondary | ICD-10-CM | POA: Diagnosis present

## 2011-07-17 LAB — POCT I-STAT TROPONIN I: Troponin i, poc: 0.14 ng/mL (ref 0.00–0.08)

## 2011-07-17 LAB — CBC
MCH: 29.8 pg (ref 26.0–34.0)
MCV: 84 fL (ref 78.0–100.0)
Platelets: 207 10*3/uL (ref 150–400)
RBC: 4.57 MIL/uL (ref 3.87–5.11)

## 2011-07-17 LAB — DIFFERENTIAL
Eosinophils Absolute: 0 10*3/uL (ref 0.0–0.7)
Lymphs Abs: 0.9 10*3/uL (ref 0.7–4.0)
Monocytes Relative: 6 % (ref 3–12)
Neutrophils Relative %: 83 % — ABNORMAL HIGH (ref 43–77)

## 2011-07-17 LAB — COMPREHENSIVE METABOLIC PANEL
AST: 16 U/L (ref 0–37)
CO2: 28 mEq/L (ref 19–32)
Calcium: 11 mg/dL — ABNORMAL HIGH (ref 8.4–10.5)
Creatinine, Ser: 0.65 mg/dL (ref 0.50–1.10)
GFR calc Af Amer: >60 mL/min (ref >60)
GFR calc non Af Amer: >60 mL/min (ref >60)
Glucose, Bld: 127 mg/dL — ABNORMAL HIGH (ref 70–99)

## 2011-07-17 LAB — D-DIMER, QUANTITATIVE: D-Dimer, Quant: 0.64 ug/mL-FEU — ABNORMAL HIGH (ref 0.00–0.48)

## 2011-07-17 MED ORDER — IOHEXOL 300 MG/ML  SOLN
100.0000 mL | Freq: Once | INTRAMUSCULAR | Status: AC | PRN
  Administered 2011-07-17: 100 mL via INTRAVENOUS

## 2011-07-17 NOTE — Discharge Summary (Signed)
  NAMEHENDRIX, CONSOLE NO.:  [id number]  MEDICAL RECORD NO.:  [id number]  LOCATION:  4731                         FACILITY:  [facility]  PHYSICIAN:  Danise Edge, M.D.   DATE OF BIRTH:  07/22/1926  DATE OF ADMISSION:  07/01/2011 DATE OF DISCHARGE:  07/05/2011                              DISCHARGE SUMMARY   DISCHARGE DIAGNOSES: 1. Oversedation due to diazepam. 2. History of iron-deficiency anemia, associated with normal     colonoscopy and esophagogastroduodenoscopy, September 28, 2010. 3. Pernicious anemia. 4. Primary hyperparathyroidism. 5. Hypertension. 6. Left anterior descending coronary artery occlusion by cardiac     catheterization on February 03, 2011. 7. Vitamin D deficiency.  DISCHARGE MEDICATIONS: 1. Lovastatin 20 mg daily. 2. Vitamin D 1000 international units daily. 3. Ranitidine 150 mg twice daily. 4. Sublingual nitroglycerin as needed. 5. Chlorthalidone 12.5 mg every other day. 6. Enteric-coated aspirin 81 mg daily. 7. Isosorbide mononitrate 30 mg daily.  OFFICE FOLLOW-UP:  The patient will be seen in the office in approximately 2 weeks.  LABORATORY DATA:  Admission chest x-ray is normal.  White blood cell count 5400, hemoglobin 14.3 grams, platelet count 213,000.  Discharge basic metabolic profile showed a potassium 3.3 and serum calcium 11.3.  Urine culture negative.  Thyroid-stimulating hormone level normal.  Cardiac enzymes negative for myocardial infarction.  Total cholesterol 148, triglycerides 175, LDL cholesterol 81, HDL cholesterol 32.  Beta-natriuretic peptide normal.  Phosphorus normal.  Magnesium normal.  Lipase normal.  HOSPITAL COURSE:  The patient is an 77 year old female admitted to the hospital with a diagnosis of failure to thrive and multiple falls at home.  The patient recently sustained an L1 compression fracture while on vacation.  She was initially prescribed hydrocodone to control pain, but due to oversedation,  the narcotic was stopped and she was advised to take acetaminophen.  The patient also has diazepam tablets at home.  I suspect she was using the diazepam and developed oversedation with failure to thrive and falls as a result of the diazepam.  The patient was admitted to the hospital for evaluation.  She has known coronary artery disease, but cardiac enzymes did not suggest myocardial infarction.  She had no symptoms to suggest heart failure.  All narcotics were stopped as well as the diazepam.  Her hospital course was uncomplicated.  CONDITION ON DISCHARGE:  At discharge, she is alert and her husband is at her bedside wishing to take her home.  The patient is discharged in stable medical condition.  I will schedule her a follow-up visit with me in approximately 2 weeks.          ______________________________ Danise Edge, M.D.     MJ/MEDQ  D:  07/05/2011  T:  07/05/2011  Job:  [id number]  Electronically Signed by Danise Edge M.D. on 07/17/2011 09:12:48 AM

## 2011-07-18 LAB — CBC
HCT: 36.5 % (ref 36.0–46.0)
Hemoglobin: 12.4 g/dL (ref 12.0–15.0)
MCH: 29.2 pg (ref 26.0–34.0)
RBC: 4.24 MIL/uL (ref 3.87–5.11)

## 2011-07-18 LAB — CARDIAC PANEL(CRET KIN+CKTOT+MB+TROPI)
CK, MB: 2.4 ng/mL (ref 0.3–4.0)
Relative Index: INVALID (ref 0.0–2.5)
Total CK: 51 U/L (ref 7–177)
Total CK: 60 U/L (ref 7–177)
Troponin I: <0.3 ng/mL (ref <0.30)
Troponin I: <0.3 ng/mL (ref <0.30)

## 2011-07-18 LAB — MRSA PCR SCREENING: MRSA by PCR: NEGATIVE

## 2011-07-18 LAB — HEPARIN LEVEL (UNFRACTIONATED)
Heparin Unfractionated: 0.22 IU/mL — ABNORMAL LOW (ref 0.30–0.70)
Heparin Unfractionated: 0.76 IU/mL — ABNORMAL HIGH (ref 0.30–0.70)

## 2011-07-18 NOTE — H&P (Signed)
NAME:  [name], [name] NO.:  [id number]  MEDICAL RECORD NO.:  [id number]  LOCATION:  2605                         FACILITY:  [facility]  PHYSICIAN:  Zacarias Pontes, MD       DATE OF BIRTH:  1926-01-13  DATE OF ADMISSION:  07/17/2011 DATE OF DISCHARGE:                             HISTORY & PHYSICAL   PRIMARY CARDIOLOGIST:  Corky Crafts, MD  PRIMARY CARE PHYSICIAN:  Danise Edge, MD  CHIEF COMPLAINT:  Shortness of breath.  HISTORY OF PRESENT ILLNESS:  [name] is a very pleasant 77 year old woman with known coronary artery disease, which is being medically managed, who presents with shortness of breath.  This morning she awoke short of breath at rest together with a sense of profound anxiety.  Her family noted her to look unwell and uncomfortable during this episode. She denies having any chest pain, nausea, or diaphoresis with this episode.  No palpitations or syncope.  She has not had any recent fevers nor has she had any recent sick contacts.  She did have some sinus congestion and dry cough over the course of the last several days.  Out of concern for her respiratory status, she came to the emergency room seeking further evaluation.  PAST MEDICAL HISTORY: 1. Coronary artery disease defined on a catheterization in March 2012     showing a chronic total occlusion of her mid LAD with evidence of     both left-to-left and right-to-left collaterals.  That same cath     revealed no aortic stenosis and a normal ejection fraction. 2. GERD. 3. Hypercholesterolemia. 4. Hypertension. 5. Peripheral vascular disease with a fem-fem bypass in the past.  SOCIAL HISTORY:  She is a lifelong nonsmoker.  Her daughter is a Engineer, civil (consulting) and accompanies her in the emergency room this evening.  FAMILY HISTORY:  Noncontributory.  REVIEW OF SYSTEMS:  As per HPI, otherwise is comprehensively negative.  ALLERGIES:  SULFA.  MEDICATIONS: 1. Zocor 10 mg p.o. daily. 2. Imdur  30 mg p.o. daily. 3. Ranitidine 150 mg p.o. b.i.d. 4. Aspirin 81 mg daily. 5. Chlorthalidone 12.5 mg every other day.  PHYSICAL EXAMINATION:  VITAL SIGNS:  The patient has a heart rate of 74 with a blood pressure of 148/88, respiratory rate is 15, oxygen saturations of 98% on 2 liters nasal cannula. GENERAL:  She is in no acute distress and is not using accessory muscles for breathing. HEENT:  Notable for a supple neck with no masses or lymphadenopathy. Her JVP is not appreciably elevated. CARDIAC:  Notable for a normal S1-S2 with a regular rate and no murmurs or rubs appreciated. LUNGS:  Clear to auscultation bilaterally aside from bibasilar crackles, which are consistently present. ABDOMEN:  Soft, nontender, and nondistended with positive bowel sounds and no abdominal bruits appreciated. EXTREMITIES:  Warm and well perfused with no lower extremity edema present. NEUROLOGIC:  Neurologically, she is alert and oriented x3 with no focal neurologic deficits detected.  She does have difficulty with longer-term memory which is chronic per both the patient and her daughter.  LABORATORY EVALUATION:  Her white count is 8000, her hematocrit is 38, her platelets are 207,000.  Sodium 136, potassium 3.6, chloride  103, bicarb 28, BUN 10, creatinine 0.6 with a glucose of 127.  Her initial point-of-care troponin was 0.06 and a repeat point-of-care troponin was 0.14.  Her D-dimer was 0.64.  An ECG performed in the emergency room revealed normal sinus rhythm with nonspecific T-wave flattening and T-wave inversion throughout.  There was no ST-segment deviation present.  Based on her D-dimer result, she underwent a CT of her chest, which showed no main or lobar pulmonary embolism in the setting of a suboptimal contrast bolus.  She did have some subcentimeter nodular opacities, which are noted in her right upper lobe and right middle lobe.  IMPRESSION:  This is an 77 year old woman with abrupt  onset shortness of breath this morning with minimal troponinemia and no EKG evidence of ischemia.  Of note, she has not had any chest pain during this time.  Though shortness of breath may have been an anginal equivalent, there is no evidence on her ECG of ongoing ischemia.  Her troponinemia is very mild and was obtained via point-of-care testing.  I am not convinced that she is experiencing an acute coronary syndrome at this time.  We will admit her to telemetry on her home medications, which include aspirin for antiplatelet therapy.  Intravenous heparin was started in the emergency room upon the return of her point-of-care markers.  I have a low suspicion for acute coronary syndrome and if her enzyme curve remains reassuring overnight, we will plan to stop the heparin in the morning.  Given her catheterization result in March, together with negative ECGs and a thus far unimpressive biomarker profile, I would favor a noninvasive approach in this elderly woman already subjected to IV contrast today for her chest CT.  Assessing how she feels in the morning will help guide therapy for the remainder of the hospitalization.  If she does indeed end up requiring a more invasive approach during this hospitalization, we will need to focus carefully on renal protection in the setting of multiple contrast loads.  Even though her creatinine is 0.6, her age raises concern for the potential for contrast-induced nephropathy and thus, we will plan accordingly to help protect her against this.  The plan was explained to the patient and the patient's daughter and they voiced understanding.  Their questions were answered to the best of my ability.          ______________________________ Zacarias Pontes, MD     DM/MEDQ  D:  07/18/2011  T:  07/18/2011  Job:  [id number]  Electronically Signed by Zacarias Pontes MD on 07/18/2011 04:17:16 AM

## 2011-07-19 LAB — CBC
HCT: 39.1 % (ref 36.0–46.0)
Hemoglobin: 13.3 g/dL (ref 12.0–15.0)
MCH: 29.3 pg (ref 26.0–34.0)
MCHC: 34 g/dL (ref 30.0–36.0)
RDW: 14.3 % (ref 11.5–15.5)

## 2011-07-19 LAB — BASIC METABOLIC PANEL
CO2: 28 mEq/L (ref 19–32)
Calcium: 11 mg/dL — ABNORMAL HIGH (ref 8.4–10.5)
Creatinine, Ser: 0.71 mg/dL (ref 0.50–1.10)
GFR calc non Af Amer: >60 mL/min (ref >60)
Glucose, Bld: 109 mg/dL — ABNORMAL HIGH (ref 70–99)

## 2011-07-20 NOTE — Discharge Summary (Signed)
  [name], [name] NO.:  [id number]  MEDICAL RECORD NO.:  [id number]  LOCATION:  3731                         FACILITY:  [facility]  PHYSICIAN:  Corky Crafts, MDDATE OF BIRTH:  [birth year]  DATE OF ADMISSION:  07/17/2011 DATE OF DISCHARGE:  07/19/2011                              DISCHARGE SUMMARY   FINAL DIAGNOSES: 1. Coronary artery disease. 2. Hypertension. 3. Shortness of breath.  HOSPITAL COURSE:  The patient was admitted after having shortness of breath.  She had a mildly positive D-dimer.  She subsequently had a CT angiogram of the chest showing no evidence of pulmonary embolus.  This was done in the emergency room.  There was a small pulmonary nodule, for which followup was to be considered.  Her shortness of breath resolved. She had no further chest pain.  By the normal lab, troponin, she did rule out for MI.  She walked in her room and in the halls without any difficulty.  She had no further shortness of breath or chest pain.  Her daughter thinks that she may have just had an anxiety attack which caused her shortness of breath.  Imdur was increased and Plavix was started.  She had no bleeding problems.  She felt well on the morning of discharge and wanted to go home.  DISCHARGE MEDICATIONS: 1. Clopidogrel 75 mg daily. 2. Imdur 60 mg p.o. daily. 3. Aspirin 81 mg daily. 4. Chlorthalidone 12.5 mg p.o. daily. 5. Iron 65 mg daily. 6. Lovastatin 20 mg daily. 7. Sublingual nitroglycerin p.r.n. 8. Potassium 20 mEq b.i.d. 9. Ranitidine 150 mg b.i.d. 10.Vitamin D3.  DIET:  Low-sodium, heart-healthy diet.  ACTIVITY:  Increase activity slowly.  FOLLOWUP APPOINTMENT:  With Dr. Eldridge Dace as scheduled.  INSTRUCTIONS:  She is to call our office if she has any further symptoms.  She has any chest pain refractory with nitroglycerin, she needs to come to the hospital via EMS.     Corky Crafts, MD     JSV/MEDQ  D:  07/19/2011  T:   07/19/2011  Job:  [id number]  Electronically Signed by Lance Muss MD on 07/20/2011 12:31:42 PM

## 2011-08-08 LAB — CK TOTAL AND CKMB (NOT AT ARMC)
CK, MB: 1.1
Relative Index: INVALID
Total CK: 49

## 2011-08-08 LAB — CBC
HCT: 38.5
Hemoglobin: 13
MCHC: 33.9
MCV: 88.9
Platelets: 219
RDW: 14.2
RDW: 14.6
WBC: 5.1

## 2011-08-08 LAB — POCT CARDIAC MARKERS
CKMB, poc: 1.1
Troponin i, poc: <0.05

## 2011-08-08 LAB — COMPREHENSIVE METABOLIC PANEL
AST: 22
Albumin: 3.9
Calcium: 10
Creatinine, Ser: 0.7
GFR calc Af Amer: >60
GFR calc non Af Amer: >60
Total Protein: 6.5

## 2011-08-08 LAB — BASIC METABOLIC PANEL
GFR calc non Af Amer: >60
Glucose, Bld: 93
Potassium: 4.2
Sodium: 141

## 2011-08-08 LAB — CARDIAC PANEL(CRET KIN+CKTOT+MB+TROPI): Total CK: 41

## 2011-08-20 NOTE — H&P (Signed)
NAME:  [name], BETZLER NO.:  [id number]  MEDICAL RECORD NO.:  [id number]  LOCATION:  [facility]                         FACILITY:  [facility]  PHYSICIAN:  Lonia Blood, M.D.      DATE OF BIRTH:  1926/07/08  DATE OF ADMISSION:  07/01/2011 DATE OF DISCHARGE:                             HISTORY & PHYSICAL   PRIMARY CARE PHYSICIAN:  Danise Edge, MD  PRESENTING COMPLAINT:  Failure to thrive and fall.  HISTORY OF PRESENT ILLNESS:  The patient is an 77 year old female with known history of coronary artery disease who had her last cath on February 04, 2011, by Dr. Eldridge Dace.  She is known to have chronic total occlusion of the left anterior descending artery with both right to left and left to right collaterals with overall normal left ventricular function and mild anterior hypokinesis.  She also has a fem-fem graft that is occluded but otherwise, she is being stable since then.  Her problems started about a month ago when she fell and sustained a compression fracture.  Per daughter since then she has actually declined gradually. The patient has not been active.  Initially, she was on high narcotics for pain and those made her so lethargic that she was very much ineffective, she was not able to move or do anything.  Later, the narcotics were discontinued.  However, the patient has not bounced back since then.  She has declined and not eating or drinking adequately and daughter is worried.  She has reached a level where she cannot take good care of her at home.  Hence, she brought her to the ED.  On arrival in the ED, initial evaluation showed abnormal EKG with diffusely depressed ST segments and the patient was rushed to the cath lab with presumed code ST elevation MI.  After evaluation by cardiologist, however, the code was cancelled as the patient's EKG was presumed not acute and did not reflect acute MI.  The patient actually has no chest pain.  She has some chronic left arm  numbness, but nothing acute.  No shortness of breath.  No chest pain.  No nausea, vomiting or diarrhea.  No cough. She is just weak overall.  PAST MEDICAL HISTORY:  Significant for: 1. Coronary artery disease as indicated with chronic total occlusion     of the LAD, last cath in March 2012. 2. Hypertension. 3. GERD. 4. Hyperlipidemia. 5. Peripheral vascular disease. 6. History of thyroid disease. 7. History of pernicious anemia.  ALLERGIES:  SULFA.  CURRENT MEDICATIONS: 1. Chlorthalidone 25 mg daily. 2. Diazepam as needed. 3. Lovastatin 20 mg daily. 4. Imdur. 5. Aspirin. 6. Nitroglycerin. 7. Ranitidine. 8. Diazepam. 9. Iron. 10.Vitamin D3.  SOCIAL HISTORY:  [place].  No tobacco, alcohol or IV drug use.  She was fairly active until this episode that is of weakness that started.  FAMILY HISTORY:  Due to the patient's age although there was report peripheral vascular disease as well as coronary artery disease.  REVIEW OF SYSTEMS:  All systems reviewed are currently negative except per HPI.  PHYSICAL EXAMINATION:  VITAL SIGNS:  Temperature 97.5, blood pressure 140/70, with pulse 109,  respiratory rate 24, sats 96% on room air. GENERAL:  The patient is awake, alert, oriented, chronically ill- looking, weak but in no acute distress. HEENT:  PERRL.  EOMI.  No pallor, no jaundice.  No rhinorrhea. NECK:  Supple.  No visible JVD.  No lymphadenopathy. RESPIRATORY:  Good air entry bilaterally.  No wheezing.  No rales. CARDIOVASCULAR:  She has S1-S2.  No murmur. ABDOMEN:  Soft, full, nontender with positive bowel sounds. EXTREMITIES:  No edema, cyanosis or clubbing. SKIN:  No rashes.  No ulcers. MUSCULOSKELETAL:  No joint swelling or tenderness.  LABORATORY DATA:  Troponin is negative at 0.04.  White count is 8.1, hemoglobin 15.4 with platelet 260 and normal differentials.  PT 12.4, INR 0.94, PTT of 30.  Sodium is 132,  potassium 2.5, chloride 87, CO2 of 29, glucose 119, BUN 15, creatinine 0.71, calcium 11.9 with a normal albumin.  Her lipase is 82.  Urinalysis showed moderate serum with some ketones.  Chest x-ray showed no active cardiopulmonary disease.  Her EKG showed normal sinus rhythm with diffuse ST depression, however, this is unchanged from her EKG in March 2012.  ASSESSMENT:  This is an 77 year old female who seems to be failing to thrive at home.  She has had recurrent falls.  Other findings include coronary artery disease with chronically abnormally EKG, gastroesophageal reflux disease, hyperlipidemia, hypertension, hypokalemia, dehydration and hypercalcemia probably from bone remodeling.  PLAN: 1. Failure to thrive.  The patient obviously has not been able to     thrive at home.  Her daughter and the patient are willing to try     nursing facility as needed.  We will however, address the     underlying medical problems especially lack of activities.  We will     get PT, OT to evaluate the patient and also ask Speech Therapy if     possible although she denied any dysphagia, but she is not eating     adequately.  We will hydrate the patient as well.  Once the patient     is improved, she may need to be at least temporarily in the nursing     facility. 2. Recurrent falls.  Again, this could be due to the generalized     deconditioning.  I will get the PT, OT involved. 3. Dehydration.  We will hydrate her aggressively for now and I will     hold her chlorthalidone in the meantime. 4. Hypokalemia.  She has profound hypokalemia.  We will give her runs     of potassium and we will follow her potassium level closely.  We     will keep her on telemetry. 5. Recent compression fracture.  She is not complaining of pain now     but will avoid high-dose narcotics as much as possible. 6. Peripheral vascular disease.  The patient has no symptoms at this     point.  We will therefore observe her  closely. 7. Hypertension.  Again, we will avoid her diuretics but we will     resume all her other medications. 8. Gastroesophageal reflux disease.  Put her on PPI in the hospital     although she is taking ranitidine at home.     Lonia Blood, M.D.     Verlin Grills  D:  07/01/2011  T:  07/01/2011  Job:  [id number]  Electronically Signed by Lonia Blood M.D. on 08/20/2011 02:58:33 PM

## 2011-08-25 ENCOUNTER — Emergency Department (HOSPITAL_COMMUNITY): Payer: Medicare Other

## 2011-08-25 ENCOUNTER — Emergency Department (HOSPITAL_COMMUNITY)
Admission: EM | Admit: 2011-08-25 | Discharge: 2011-08-25 | Disposition: A | Discharge status: Home / Self Care | Payer: Medicare Other | Attending: Emergency Medicine | Admitting: Emergency Medicine

## 2011-08-25 DIAGNOSIS — Z79899 Other long term (current) drug therapy: Secondary | ICD-10-CM | POA: Insufficient documentation

## 2011-08-25 DIAGNOSIS — K219 Gastro-esophageal reflux disease without esophagitis: Secondary | ICD-10-CM | POA: Insufficient documentation

## 2011-08-25 DIAGNOSIS — Z9889 Other specified postprocedural states: Secondary | ICD-10-CM | POA: Insufficient documentation

## 2011-08-25 DIAGNOSIS — I2510 Atherosclerotic heart disease of native coronary artery without angina pectoris: Secondary | ICD-10-CM | POA: Insufficient documentation

## 2011-08-25 DIAGNOSIS — T18108A Unspecified foreign body in esophagus causing other injury, initial encounter: Secondary | ICD-10-CM | POA: Insufficient documentation

## 2011-08-25 DIAGNOSIS — I739 Peripheral vascular disease, unspecified: Secondary | ICD-10-CM | POA: Insufficient documentation

## 2011-08-25 DIAGNOSIS — R0602 Shortness of breath: Secondary | ICD-10-CM | POA: Insufficient documentation

## 2011-08-25 DIAGNOSIS — I10 Essential (primary) hypertension: Secondary | ICD-10-CM | POA: Insufficient documentation

## 2011-08-25 DIAGNOSIS — R059 Cough, unspecified: Secondary | ICD-10-CM | POA: Insufficient documentation

## 2011-08-25 DIAGNOSIS — IMO0002 Reserved for concepts with insufficient information to code with codable children: Secondary | ICD-10-CM | POA: Insufficient documentation

## 2011-08-25 DIAGNOSIS — E7800 Pure hypercholesterolemia, unspecified: Secondary | ICD-10-CM | POA: Insufficient documentation

## 2011-08-25 DIAGNOSIS — R1110 Vomiting, unspecified: Secondary | ICD-10-CM | POA: Insufficient documentation

## 2011-08-25 DIAGNOSIS — R05 Cough: Secondary | ICD-10-CM | POA: Insufficient documentation

## 2011-08-25 LAB — DIFFERENTIAL
Basophils Absolute: 0 10*3/uL (ref 0.0–0.1)
Basophils Relative: 1 % (ref 0–1)
Eosinophils Absolute: 0 10*3/uL (ref 0.0–0.7)
Monocytes Relative: 10 % (ref 3–12)
Neutro Abs: 3.2 10*3/uL (ref 1.7–7.7)
Neutrophils Relative %: 65 % (ref 43–77)

## 2011-08-25 LAB — CBC
Hemoglobin: 12.7 g/dL (ref 12.0–15.0)
MCH: 29.8 pg (ref 26.0–34.0)
MCHC: 34 g/dL (ref 30.0–36.0)
Platelets: 195 10*3/uL (ref 150–400)
RBC: 4.26 MIL/uL (ref 3.87–5.11)

## 2011-08-25 LAB — COMPREHENSIVE METABOLIC PANEL
AST: 21 U/L (ref 0–37)
Albumin: 4.1 g/dL (ref 3.5–5.2)
BUN: 13 mg/dL (ref 6–23)
Creatinine, Ser: 0.73 mg/dL (ref 0.50–1.10)
Potassium: 4.3 mEq/L (ref 3.5–5.1)
Total Protein: 6.7 g/dL (ref 6.0–8.3)

## 2011-08-25 LAB — POCT I-STAT TROPONIN I: Troponin i, poc: 0 ng/mL (ref 0.00–0.08)

## 2011-08-30 ENCOUNTER — Ambulatory Visit
Admission: RE | Admit: 2011-08-30 | Discharge: 2011-08-30 | Disposition: A | Discharge status: Home / Self Care | Payer: Medicare Other | Source: Ambulatory Visit | Attending: Gastroenterology | Admitting: Gastroenterology

## 2011-08-30 ENCOUNTER — Other Ambulatory Visit: Payer: Self-pay | Admitting: Gastroenterology

## 2011-10-11 ENCOUNTER — Other Ambulatory Visit (HOSPITAL_COMMUNITY)
Admission: RE | Admit: 2011-10-11 | Discharge: 2011-10-11 | Disposition: A | Discharge status: Home / Self Care | Payer: Medicare Other | Source: Ambulatory Visit | Attending: Obstetrics and Gynecology | Admitting: Obstetrics and Gynecology

## 2011-10-11 ENCOUNTER — Other Ambulatory Visit: Payer: Self-pay | Admitting: Obstetrics and Gynecology

## 2011-10-11 DIAGNOSIS — Z124 Encounter for screening for malignant neoplasm of cervix: Secondary | ICD-10-CM | POA: Insufficient documentation

## 2012-02-20 ENCOUNTER — Other Ambulatory Visit: Payer: Self-pay | Admitting: Gastroenterology

## 2012-02-20 DIAGNOSIS — Z1231 Encounter for screening mammogram for malignant neoplasm of breast: Secondary | ICD-10-CM

## 2012-02-29 ENCOUNTER — Ambulatory Visit
Admission: RE | Admit: 2012-02-29 | Discharge: 2012-02-29 | Disposition: A | Discharge status: Home / Self Care | Payer: Medicare Other | Source: Ambulatory Visit | Attending: Gastroenterology | Admitting: Gastroenterology

## 2012-02-29 DIAGNOSIS — Z1231 Encounter for screening mammogram for malignant neoplasm of breast: Secondary | ICD-10-CM

## 2012-06-01 ENCOUNTER — Ambulatory Visit (INDEPENDENT_AMBULATORY_CARE_PROVIDER_SITE_OTHER): Payer: Medicare Other | Admitting: Internal Medicine

## 2012-06-01 ENCOUNTER — Encounter: Payer: Self-pay | Admitting: Internal Medicine

## 2012-06-01 ENCOUNTER — Ambulatory Visit (INDEPENDENT_AMBULATORY_CARE_PROVIDER_SITE_OTHER)
Admission: RE | Admit: 2012-06-01 | Discharge: 2012-06-01 | Disposition: A | Discharge status: Home / Self Care | Payer: Medicare Other | Source: Ambulatory Visit | Attending: Internal Medicine | Admitting: Internal Medicine

## 2012-06-01 VITALS — BP 132/76 | HR 80 | Ht 62.0 in | Wt 151.6 lb

## 2012-06-01 DIAGNOSIS — R0609 Other forms of dyspnea: Secondary | ICD-10-CM

## 2012-06-01 DIAGNOSIS — J45909 Unspecified asthma, uncomplicated: Secondary | ICD-10-CM

## 2012-06-01 DIAGNOSIS — R0989 Other specified symptoms and signs involving the circulatory and respiratory systems: Secondary | ICD-10-CM

## 2012-06-01 DIAGNOSIS — I739 Peripheral vascular disease, unspecified: Secondary | ICD-10-CM

## 2012-06-01 DIAGNOSIS — I2510 Atherosclerotic heart disease of native coronary artery without angina pectoris: Secondary | ICD-10-CM

## 2012-06-01 NOTE — Progress Notes (Signed)
06/01/12- 56 yoF never smoker referred courtesy of Dr. Donette Larry with  concern of shortness of breath and wheezing.  Daughter is here.   PCP Dr. Danise Edge. She had seen Dr. Shelle Iron years ago for shortness of breath but "got used to it". Remote diagnosis of asthma described as mild and treated with rescue inhaler about once a day. Gradually worse dyspnea on exertion now room to room over the past 6-10 months. Spells of tickling cough, dry. Wheezes a lot. Okay at night while supine and sitting. Some history of pollen rhinitis and headache. Pneumonia as a child and again in 2012. Fell in 2012 sustaining vertebral fractures. Cardiac cath 2012 positive for coronary disease/ angioplasty without stents. History of femoral bypass surgery. Pernicious anemia. GERD. Father and brothers had emphysema;  mother had heart disease. 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  Prior to Admission medications   Medication Sig Start Date End Date Taking? Authorizing Provider  aspirin 81 MG tablet Take 81 mg by mouth daily.   Yes Historical Provider, MD  chlorthalidone (HYGROTON) 25 MG tablet Take 25 mg by mouth daily.     Yes Historical Provider, MD  Cholecalciferol (VITAMIN D) 1000 UNITS capsule Take 1,000 Units by mouth daily.   Yes Historical Provider, MD  clopidogrel (PLAVIX) 75 MG tablet Take 75 mg by mouth daily.   Yes Historical Provider, MD  diazepam (VALIUM) 5 MG tablet Take 5 mg by mouth daily as needed.     Yes Historical Provider, MD  isosorbide mononitrate (ISMO,MONOKET) 20 MG tablet Take 30 mg by mouth daily.     Yes Historical Provider, MD  lovastatin (MEVACOR) 20 MG tablet Take 20 mg by mouth every morning.    Yes Historical Provider, MD  metoprolol succinate (TOPROL-XL) 25 MG 24 hr tablet Take 25 mg by mouth 2 (two) times daily.   Yes Historical Provider, MD  nitroGLYCERIN (NITROSTAT) 0.4 MG SL tablet Place 0.4 mg under the tongue every 5 (five)  minutes as needed.     Yes Historical Provider, MD  potassium chloride (KLOR-CON) 20 MEQ packet Take 20 mEq by mouth 2 (two) times daily.   Yes Historical Provider, MD  ranitidine (ZANTAC) 150 MG tablet Take 150 mg by mouth 2 (two) times daily.     Yes Historical Provider, MD  ferrous sulfate 325 (65 FE) MG tablet Take 325 mg by mouth daily with breakfast.      Historical Provider, MD  furosemide (LASIX) 40 MG tablet Take 40 mg by mouth daily.      Historical Provider, MD   Past Medical History  Diagnosis Date  . DVT (deep venous thrombosis)   . Anemia   . Back injury   . Acid reflux   . Hypertension   . Hypercholesterolemia   . Anginal pain   . Asthma    Past Surgical History  Procedure Date  . Abdominal surgery   . Tonsillectomy   . Hernia repair   . Angioplasty     no stent  . Vesicovaginal fistula closure w/ tah   . Breast biopsy     left side  . Knee surgery     left knee  . Leg bypass     left leg   Family History  Problem Relation Age of Onset  . Emphysema Brother     2 brothers  . Emphysema Father   . Heart failure Mother   . Diabetes Mother  History   Social History  . Marital Status: Married    Spouse Name: N/A    Number of Children: 2  . Years of Education: N/A   Occupational History  . 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 Smoking status: Never Smoker   . Smokeless tobacco: Never Used  . Alcohol Use: No  . Drug Use: No  . Sexually Active: Not on file   Other Topics Concern  . Not on file   Social History Narrative  . No narrative on file   ROS-see HPI Constitutional:   No-   weight loss, night sweats, fevers, chills, fatigue, lassitude. HEENT:   +  headaches, difficulty swallowing, tooth/dental problems, sore throat,       No-  sneezing, No-itching, ear ache, nasal congestion, post nasal drip,  CV:  No-   chest pain, orthopnea, PND, swelling in lower extremities, anasarca, dizziness, palpitations Resp: + shortness  of breath with exertion or at rest.              No-   productive cough,  + non-productive cough,  No- coughing up of blood.              No-   change in color of mucus.  No- wheezing.   Skin: No-   rash or lesions. GI:  No-   heartburn, indigestion, abdominal pain, nausea, vomiting, diarrhea,                 change in bowel habits, loss of appetite GU: No-   dysuria, change in color of urine, no urgency or frequency.  No- flank pain. MS:  +  joint pain or swelling.  No- decreased range of motion.  No- back pain. Neuro-     nothing unusual Psych:  No- change in mood or affect. No depression or anxiety.  No memory loss.  OBJ- Physical Exam General- Alert, Oriented, Affect-appropriate, Distress- none acute, short/ stocky Skin- rash-none, lesions- none, excoriation- none Lymphadenopathy- none Head- atraumatic            Eyes- Gross vision intact, PERRLA, conjunctivae and secretions clear            Ears- Hearing, canals-normal            Nose- Clear, no-Septal dev, mucus, polyps, erosion, perforation             Throat- Mallampati II , mucosa clear , drainage- none, tonsils- atrophic Neck- flexible , trachea midline, no stridor , thyroid nl, carotid no bruit Chest - symmetrical excursion , unlabored           Heart/CV- RRR , no murmur , no gallop  , no rub, nl s1 s2                           - JVD- none , edema- none, stasis changes- none, varices- none, +diminished pedal pulses           Lung- +coarse crackles in bases, wheeze- none, cough- none , dullness-none, rub- none           Chest wall-  Abd- tender-no, distended-no, bowel sounds-present, HSM- no Br/ Gen/ Rectal- Not done, not indicated Extrem- cyanosis- none, clubbing, none, atrophy- none, strength- nl Neuro- grossly intact to observation

## 2012-06-01 NOTE — Patient Instructions (Addendum)
Order- CXR   Dx dyspnea on exertion              Schedule PFT and 6 MWT     Please call as needed

## 2012-06-07 ENCOUNTER — Encounter: Payer: Self-pay | Admitting: Internal Medicine

## 2012-06-07 DIAGNOSIS — J45909 Unspecified asthma, uncomplicated: Secondary | ICD-10-CM | POA: Insufficient documentation

## 2012-06-07 DIAGNOSIS — I2510 Atherosclerotic heart disease of native coronary artery without angina pectoris: Secondary | ICD-10-CM | POA: Insufficient documentation

## 2012-06-07 DIAGNOSIS — I739 Peripheral vascular disease, unspecified: Secondary | ICD-10-CM | POA: Insufficient documentation

## 2012-06-07 NOTE — Assessment & Plan Note (Signed)
For more complete evaluation later. She describes using a rescue inhaler as much as once a day.

## 2012-06-07 NOTE — Assessment & Plan Note (Signed)
Nonspecific dyspnea on exertion with known atherosclerotic coronary and peripheral vascular disease. We need to define how much of the dyspnea is pulmonary. Arthritis in knees is limiting exercise and we can assume there is a component of deconditioning. Plan-schedule full pulmonary function testing

## 2012-06-19 ENCOUNTER — Ambulatory Visit (INDEPENDENT_AMBULATORY_CARE_PROVIDER_SITE_OTHER): Payer: Medicare Other | Admitting: Internal Medicine

## 2012-06-19 DIAGNOSIS — R0989 Other specified symptoms and signs involving the circulatory and respiratory systems: Secondary | ICD-10-CM

## 2012-06-19 DIAGNOSIS — R0600 Dyspnea, unspecified: Secondary | ICD-10-CM

## 2012-06-19 LAB — PULMONARY FUNCTION TEST

## 2012-06-19 NOTE — Progress Notes (Signed)
PFT done today. 

## 2012-07-12 ENCOUNTER — Ambulatory Visit (INDEPENDENT_AMBULATORY_CARE_PROVIDER_SITE_OTHER): Payer: Medicare Other | Admitting: Internal Medicine

## 2012-07-12 ENCOUNTER — Encounter: Payer: Self-pay | Admitting: Internal Medicine

## 2012-07-12 VITALS — BP 122/78 | HR 79 | Ht 62.0 in | Wt 155.8 lb

## 2012-07-12 DIAGNOSIS — J449 Chronic obstructive pulmonary disease, unspecified: Secondary | ICD-10-CM

## 2012-07-12 DIAGNOSIS — R0609 Other forms of dyspnea: Secondary | ICD-10-CM

## 2012-07-12 MED ORDER — FLUTICASONE-SALMETEROL 100-50 MCG/DOSE IN AEPB: INHALATION_SPRAY | RESPIRATORY_TRACT | Status: DC

## 2012-07-12 NOTE — Progress Notes (Signed)
06/01/12- 89 yoF never smoker referred courtesy of Dr. Donette Larry with  concern of shortness of breath and wheezing.  Daughter is here.   PCP Dr. Danise Edge. She had seen Dr. Shelle Iron years ago for shortness of breath but "got used to it". Remote diagnosis of asthma described as mild and treated with rescue inhaler about once a day. Gradually worse dyspnea on exertion now room to room over the past 6-10 months. Spells of tickling cough, dry. Wheezes a lot. Okay at night while supine and sitting. Some history of pollen rhinitis and headache. Pneumonia as a child and again in 2012. Fell in 2012 sustaining vertebral fractures. Cardiac cath 2012 positive for coronary disease/ angioplasty without stents. History of femoral bypass surgery. Pernicious anemia. GERD. Father and brothers had emphysema;  mother had heart disease. 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  07/12/12- 59 yoF never smoker referred courtesy of Dr. Donette Larry with  concern of shortness of breath and wheezing.  Daughter is here.   PCP Dr. Danise Edge Go over walk results and PFT-- reports breathing is "about the same" Occasional cough with scant sputum. Does not change with weather and feels stable. Her daughter says that she does not snore or stop breathing in her sleep. Declines flu vaccine. CXR 06/05/12-  IMPRESSION:  1. No acute cardiopulmonary abnormalities.  2. Left lung scarring.  Original Report Authenticated By: Rosealee Albee, M.D.  PFT 06/19/2012: Mild obstructive airways disease with response to bronchodilator, air trapping. Diffusion severely reduced. FEV1 1.18/90% FEV1/FVC 0.71 FEF 25-75% 0.74/46% RV 131%, DLCO 22%. 6MWT-91%, 90%, 91%. Stopped at 2 minutes and 58 seconds due to shortness of breath with saturation 87% then went on to a total of 172 m.  ROS-see HPI Constitutional:   No-   weight loss, night sweats, fevers, chills, fatigue, lassitude. HEENT:   +   headaches, difficulty swallowing, tooth/dental problems, sore throat,       No-  sneezing, No-itching, ear ache, nasal congestion, post nasal drip,  CV:  No-   chest pain, orthopnea, PND, swelling in lower extremities, anasarca, dizziness, palpitations Resp: + shortness of breath with exertion or at rest.              No-   productive cough,  + non-productive cough,  No- coughing up of blood.              No-   change in color of mucus.  No- wheezing.   Skin: No-   rash or lesions. GI:  No-   heartburn, indigestion, abdominal pain, nausea, vomiting,  GU:  MS:  +  joint pain or swelling.   Neuro-     nothing unusual Psych:  No- change in mood or affect. No depression or anxiety.  No memory loss.  OBJ- Physical Exam General- Alert, Oriented, Affect-appropriate, Distress- none acute, short/ stocky Skin- rash-none, lesions- none, excoriation- none Lymphadenopathy- none Head- atraumatic            Eyes- Gross vision intact, PERRLA, conjunctivae and secretions clear            Ears- Hearing, canals-normal            Nose- Clear, no-Septal dev, mucus, polyps, erosion, perforation             Throat- Mallampati II , mucosa clear , drainage- none, tonsils- atrophic Neck- flexible , trachea midline, no stridor , thyroid nl, carotid no  bruit Chest - symmetrical excursion , unlabored           Heart/CV- RRR , no murmur , no gallop  , no rub, nl s1 s2                           - JVD- none , edema- none, stasis changes- none, varices- none, +diminished pedal pulses           Lung- +coarse crackles in bases, shallow inspiratory effort, wheeze- none, cough- none , dullness-none, rub- none           Chest wall-  Abd-  Br/ Gen/ Rectal- Not done, not indicated Extrem- cyanosis- none, clubbing, none, atrophy- none, strength- nl Neuro- grossly intact to observation

## 2012-07-12 NOTE — Patient Instructions (Addendum)
It would help your breathing if you could lose some weight  Order- [facility]NO[facility] room air    Dx Asthma with COPD  Sample and script Advair 100-50 maintenance inhaler    1 puff then trinse mouth two times every day  I recommend a flu shot if you change your mind

## 2012-07-19 ENCOUNTER — Other Ambulatory Visit: Payer: Self-pay | Admitting: Internal Medicine

## 2012-07-19 ENCOUNTER — Telehealth: Payer: Self-pay | Admitting: Internal Medicine

## 2012-07-19 DIAGNOSIS — J45909 Unspecified asthma, uncomplicated: Secondary | ICD-10-CM

## 2012-07-19 NOTE — Telephone Encounter (Signed)
Called, spoke with Melissa.  States pt received call from our office.  Thinks this may have been in regards to ONO results (this was done on Tuesday night).  I do not see anything scanned into chart.  Katie, pls advise is you or Dr. Maple Hudson tried calling pt.  Thank you.

## 2012-07-19 NOTE — Telephone Encounter (Signed)
Spoke with Melissa-aware that patient's ONO showed patient needs O2 at [facility] and this order has been placed. Will relay message to patient.

## 2012-07-21 NOTE — Assessment & Plan Note (Signed)
There is an obstructive component in small airways with air trapping. There is also probable obesity hypoventilation syndrome and deconditioning. She desaturated to 87% and had to stop to rest during a 6 minute walk test. Plan-weight loss, trial of Advair 100/50, overnight oximetry

## 2012-07-26 ENCOUNTER — Encounter: Payer: Self-pay | Admitting: Internal Medicine

## 2012-10-29 ENCOUNTER — Telehealth: Payer: Self-pay | Admitting: Internal Medicine

## 2012-10-29 NOTE — Telephone Encounter (Signed)
Samples are ready to be picked up, pt husband is aware.

## 2012-11-13 ENCOUNTER — Other Ambulatory Visit (INDEPENDENT_AMBULATORY_CARE_PROVIDER_SITE_OTHER): Payer: Medicare HMO

## 2012-11-13 ENCOUNTER — Ambulatory Visit (INDEPENDENT_AMBULATORY_CARE_PROVIDER_SITE_OTHER): Payer: Medicare HMO | Admitting: Internal Medicine

## 2012-11-13 ENCOUNTER — Encounter: Payer: Self-pay | Admitting: Internal Medicine

## 2012-11-13 ENCOUNTER — Ambulatory Visit (INDEPENDENT_AMBULATORY_CARE_PROVIDER_SITE_OTHER)
Admission: RE | Admit: 2012-11-13 | Discharge: 2012-11-13 | Disposition: A | Discharge status: Home / Self Care | Payer: Medicare HMO | Source: Ambulatory Visit | Attending: Internal Medicine | Admitting: Internal Medicine

## 2012-11-13 VITALS — BP 104/60 | HR 72 | Ht 62.0 in | Wt 160.4 lb

## 2012-11-13 DIAGNOSIS — R0600 Dyspnea, unspecified: Secondary | ICD-10-CM

## 2012-11-13 DIAGNOSIS — R0989 Other specified symptoms and signs involving the circulatory and respiratory systems: Secondary | ICD-10-CM

## 2012-11-13 DIAGNOSIS — R0609 Other forms of dyspnea: Secondary | ICD-10-CM

## 2012-11-13 LAB — CBC WITH DIFFERENTIAL/PLATELET
Basophils Absolute: 0 10*3/uL (ref 0.0–0.1)
Eosinophils Absolute: 0.1 10*3/uL (ref 0.0–0.7)
Lymphocytes Relative: 18.3 % (ref 12.0–46.0)
MCHC: 33.5 g/dL (ref 30.0–36.0)
Neutro Abs: 3.6 10*3/uL (ref 1.4–7.7)
Neutrophils Relative %: 66.8 % (ref 43.0–77.0)
Platelets: 223 10*3/uL (ref 150.0–400.0)
RDW: 15.9 % — ABNORMAL HIGH (ref 11.5–14.6)

## 2012-11-13 NOTE — Progress Notes (Signed)
06/01/12- 79 yoF never smoker referred courtesy of Dr. Donette Larry with  concern of shortness of breath and wheezing.  Daughter is here.   PCP Dr. Danise Edge. She had seen Dr. Shelle Iron years ago for shortness of breath but "got used to it". Remote diagnosis of asthma described as mild and treated with rescue inhaler about once a day. Gradually worse dyspnea on exertion now room to room over the past 6-10 months. Spells of tickling cough, dry. Wheezes a lot. Okay at night while supine and sitting. Some history of pollen rhinitis and headache. Pneumonia as a child and again in 2012. Fell in 2012 sustaining vertebral fractures. Cardiac cath 2012 positive for coronary disease/ angioplasty without stents. History of femoral bypass surgery. Pernicious anemia. GERD. Father and brothers had emphysema;  mother had heart disease. 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  07/12/12- 77 yoF never smoker referred courtesy of Dr. Donette Larry with  concern of shortness of breath and wheezing.  Daughter is here.   PCP Dr. Danise Edge Go over walk results and PFT-- reports breathing is "about the same" Occasional cough with scant sputum. Does not change with weather and feels stable. Her daughter says that she does not snore or stop breathing in her sleep. Declines flu vaccine. CXR 06/05/12-  IMPRESSION:  1. No acute cardiopulmonary abnormalities.  2. Left lung scarring.  Original Report Authenticated By: Rosealee Albee, M.D.  PFT 06/19/2012: Mild obstructive airways disease with response to bronchodilator, air trapping. Diffusion severely reduced. FEV1 1.18/90% FEV1/FVC 0.71 FEF 25-75% 0.74/46% RV 131%, DLCO 22%. 6MWT-91%, 90%, 91%. Stopped at 2 minutes and 58 seconds due to shortness of breath with saturation 87% then went on to a total of 172 m.  11/13/12- 86 yoF never smoker referred courtesy of Dr. Donette Larry with  concern of shortness of breath and wheezing    Daughter here FOLLOWS FOR: states breathing is little better but gives out quickly(some of this is due to knee) O2 levels drop when walking or moving around ONOX 07/16/12- +qual for sleep O2 O2 for sleep -2L/ APS. She doesn't always bother to put it on. Daughter says she is better in the mornings when she has worn at for the night. The patient thinks her dyspnea on exertion is better if she skips the oxygen. She blames postnasal drip for her cough and reports much less wheeze.  ROS-see HPI Constitutional:   No-   weight loss, night sweats, fevers, chills, fatigue, lassitude. HEENT:   +  headaches, difficulty swallowing, tooth/dental problems, sore throat,       No-  sneezing, No-itching, ear ache, nasal congestion, +post nasal drip,  CV:  No-   chest pain, orthopnea, PND, swelling in lower extremities, anasarca, dizziness, palpitations Resp: + shortness of breath with exertion or at rest.              No-   productive cough,  + non-productive cough,  No- coughing up of blood.              No-   change in color of mucus.  No- wheezing.   Skin: No-   rash or lesions. GI:  No-   heartburn, indigestion, abdominal pain, nausea, vomiting,  GU:  MS:  +  joint pain or swelling.   Neuro-     nothing unusual Psych:  No- change in mood or affect. No depression or anxiety.  No memory loss.  OBJ- Physical Exam General- Alert, Oriented, Affect-appropriate, Distress- none acute, short/ stocky Skin- rash-none, lesions- none, excoriation- none Lymphadenopathy- none Head- atraumatic            Eyes- Gross vision intact, PERRLA, conjunctivae and secretions clear            Ears- Hearing, canals-normal            Nose- Clear, no-Septal dev, mucus, polyps, erosion, perforation             Throat- Mallampati II , mucosa clear , drainage- none, tonsils- atrophic Neck- flexible , trachea midline, no stridor , thyroid nl, carotid no bruit Chest - symmetrical excursion , unlabored           Heart/CV- RRR , no  murmur , no gallop  , no rub, nl s1 s2                           - JVD- none , edema- none, stasis changes- none, varices- none, +diminished pedal pulses           Lung- + crackles in bases, shallow inspiratory effort, wheeze- none, + dry cough with deep breath , dullness-none, rub- none           Chest wall-  Abd-  Br/ Gen/ Rectal- Not done, not indicated Extrem- cyanosis- none, clubbing, none, atrophy- none, strength- nl Neuro- grossly intact to observation

## 2012-11-13 NOTE — Patient Instructions (Addendum)
Order- CXR                                       Dx dyspnea             Lab - D-dimer, CBC    Please call as needed

## 2012-11-14 ENCOUNTER — Telehealth: Payer: Self-pay | Admitting: Internal Medicine

## 2012-11-14 DIAGNOSIS — R0600 Dyspnea, unspecified: Secondary | ICD-10-CM

## 2012-11-14 LAB — D-DIMER, QUANTITATIVE

## 2012-11-14 NOTE — Telephone Encounter (Signed)
I would feel safer for her if we could redraw that D-dimer in the next few days for dx of dyspnea. Alternatively, could we help her get it drawn outpatient lab at a hospital closer to home so she doesn't have to travel so far just for this?

## 2012-11-15 NOTE — Progress Notes (Signed)
Quick Note:  Spoke with pt and notified of results per Dr. Young. Pt verbalized understanding and denied any questions.  ______ 

## 2012-11-16 ENCOUNTER — Other Ambulatory Visit: Payer: Medicare HMO

## 2012-11-16 DIAGNOSIS — R0600 Dyspnea, unspecified: Secondary | ICD-10-CM

## 2012-11-16 NOTE — Telephone Encounter (Signed)
Pt called back-message was given to triage to call patient. I spoke with [name] and told her I would call the patient. I spoke with Mrs [name] and explained the situation about the d-dimer lab;patient is willing to come back today to our office to have redrawn. I spoke with [name] in lab and she is aware that patient will come to lab today by 530 pm. Lab entered in EPIC.

## 2012-11-16 NOTE — Telephone Encounter (Signed)
Spoke with patients husband-states patient was not available to speak with me at this time but would have her call me back.

## 2012-11-17 LAB — D-DIMER, QUANTITATIVE: D-Dimer, Quant: 0.36 ug/mL-FEU (ref 0.00–0.48)

## 2012-11-21 NOTE — Progress Notes (Signed)
Quick Note:  Pt aware of results. ______ 

## 2012-11-23 NOTE — Assessment & Plan Note (Signed)
Although be more important at rest, I suspect obesity/hypoventilation syndrome is an important part of her dyspnea. This would also explain her low diffusion capacity. I will repeat chest x-ray to look at stability of hypoventilation Plan- d-dimer, CBC, CXR

## 2013-02-07 ENCOUNTER — Inpatient Hospital Stay (HOSPITAL_COMMUNITY)
Admission: EM | Admit: 2013-02-07 | Discharge: 2013-02-11 | DRG: 189 | Disposition: A | Discharge status: Home Health | Payer: Medicare HMO | Attending: Internal Medicine | Admitting: Internal Medicine

## 2013-02-07 ENCOUNTER — Emergency Department (HOSPITAL_COMMUNITY): Payer: Medicare HMO

## 2013-02-07 ENCOUNTER — Encounter (HOSPITAL_COMMUNITY): Payer: Self-pay | Admitting: *Deleted

## 2013-02-07 DIAGNOSIS — Z7982 Long term (current) use of aspirin: Secondary | ICD-10-CM

## 2013-02-07 DIAGNOSIS — Z7902 Long term (current) use of antithrombotics/antiplatelets: Secondary | ICD-10-CM

## 2013-02-07 DIAGNOSIS — Z86718 Personal history of other venous thrombosis and embolism: Secondary | ICD-10-CM

## 2013-02-07 DIAGNOSIS — Z836 Family history of other diseases of the respiratory system: Secondary | ICD-10-CM

## 2013-02-07 DIAGNOSIS — J9600 Acute respiratory failure, unspecified whether with hypoxia or hypercapnia: Secondary | ICD-10-CM

## 2013-02-07 DIAGNOSIS — Z79899 Other long term (current) drug therapy: Secondary | ICD-10-CM

## 2013-02-07 DIAGNOSIS — I509 Heart failure, unspecified: Secondary | ICD-10-CM | POA: Diagnosis present

## 2013-02-07 DIAGNOSIS — E7800 Pure hypercholesterolemia, unspecified: Secondary | ICD-10-CM | POA: Diagnosis present

## 2013-02-07 DIAGNOSIS — I2510 Atherosclerotic heart disease of native coronary artery without angina pectoris: Secondary | ICD-10-CM | POA: Diagnosis present

## 2013-02-07 DIAGNOSIS — I10 Essential (primary) hypertension: Secondary | ICD-10-CM | POA: Diagnosis present

## 2013-02-07 DIAGNOSIS — IMO0002 Reserved for concepts with insufficient information to code with codable children: Secondary | ICD-10-CM

## 2013-02-07 DIAGNOSIS — I739 Peripheral vascular disease, unspecified: Secondary | ICD-10-CM

## 2013-02-07 DIAGNOSIS — K219 Gastro-esophageal reflux disease without esophagitis: Secondary | ICD-10-CM | POA: Diagnosis present

## 2013-02-07 DIAGNOSIS — E871 Hypo-osmolality and hyponatremia: Secondary | ICD-10-CM | POA: Diagnosis not present

## 2013-02-07 DIAGNOSIS — J449 Chronic obstructive pulmonary disease, unspecified: Secondary | ICD-10-CM | POA: Diagnosis present

## 2013-02-07 DIAGNOSIS — Z882 Allergy status to sulfonamides status: Secondary | ICD-10-CM

## 2013-02-07 DIAGNOSIS — I5021 Acute systolic (congestive) heart failure: Secondary | ICD-10-CM | POA: Diagnosis present

## 2013-02-07 DIAGNOSIS — E785 Hyperlipidemia, unspecified: Secondary | ICD-10-CM | POA: Diagnosis present

## 2013-02-07 DIAGNOSIS — Z833 Family history of diabetes mellitus: Secondary | ICD-10-CM

## 2013-02-07 DIAGNOSIS — I70209 Unspecified atherosclerosis of native arteries of extremities, unspecified extremity: Secondary | ICD-10-CM | POA: Diagnosis present

## 2013-02-07 DIAGNOSIS — J4489 Other specified chronic obstructive pulmonary disease: Secondary | ICD-10-CM | POA: Diagnosis present

## 2013-02-07 DIAGNOSIS — J45909 Unspecified asthma, uncomplicated: Secondary | ICD-10-CM

## 2013-02-07 LAB — CBC
MCHC: 33.6 g/dL (ref 30.0–36.0)
Platelets: 213 10*3/uL (ref 150–400)
RDW: 14.4 % (ref 11.5–15.5)
WBC: 10.2 10*3/uL (ref 4.0–10.5)

## 2013-02-07 LAB — PRO B NATRIURETIC PEPTIDE: Pro B Natriuretic peptide (BNP): 803.5 pg/mL — ABNORMAL HIGH (ref 0–450)

## 2013-02-07 LAB — URINALYSIS, ROUTINE W REFLEX MICROSCOPIC
Bilirubin Urine: NEGATIVE
Hgb urine dipstick: NEGATIVE
Nitrite: NEGATIVE
Specific Gravity, Urine: 1.011 (ref 1.005–1.030)
Urobilinogen, UA: 0.2 mg/dL (ref 0.0–1.0)
pH: 5 (ref 5.0–8.0)

## 2013-02-07 LAB — TROPONIN I: Troponin I: <0.3 ng/mL (ref <0.30)

## 2013-02-07 LAB — COMPREHENSIVE METABOLIC PANEL
AST: 22 U/L (ref 0–37)
Albumin: 3.9 g/dL (ref 3.5–5.2)
Alkaline Phosphatase: 75 U/L (ref 39–117)
Chloride: 97 mEq/L (ref 96–112)
Potassium: 4.3 mEq/L (ref 3.5–5.1)
Total Bilirubin: 0.3 mg/dL (ref 0.3–1.2)
Total Protein: 6.9 g/dL (ref 6.0–8.3)

## 2013-02-07 MED ORDER — ASPIRIN 81 MG PO TABS
81.0000 mg | ORAL_TABLET | Freq: Every day | ORAL | Status: DC
Start: 2013-02-07 — End: 2013-02-07

## 2013-02-07 MED ORDER — ONDANSETRON HCL 4 MG/2ML IJ SOLN: 4.0000 mg | Freq: Four times a day (QID) | INTRAMUSCULAR | Status: DC | PRN

## 2013-02-07 MED ORDER — POTASSIUM CHLORIDE CRYS ER 20 MEQ PO TBCR
20.0000 meq | EXTENDED_RELEASE_TABLET | Freq: Two times a day (BID) | ORAL | Status: DC
  Administered 2013-02-07 – 2013-02-11 (×9): 20 meq via ORAL
  Filled 2013-02-07 (×10): qty 1

## 2013-02-07 MED ORDER — NITROGLYCERIN 2 % TD OINT
1.0000 [in_us] | TOPICAL_OINTMENT | Freq: Once | TRANSDERMAL | Status: AC
  Administered 2013-02-07: 1 [in_us] via TOPICAL
  Filled 2013-02-07: qty 1

## 2013-02-07 MED ORDER — ASPIRIN EC 325 MG PO TBEC
325.0000 mg | DELAYED_RELEASE_TABLET | Freq: Every day | ORAL | Status: DC
  Administered 2013-02-07 – 2013-02-11 (×5): 325 mg via ORAL
  Filled 2013-02-07 (×5): qty 1

## 2013-02-07 MED ORDER — ALBUTEROL SULFATE (5 MG/ML) 0.5% IN NEBU
5.0000 mg | INHALATION_SOLUTION | Freq: Once | RESPIRATORY_TRACT | Status: AC
  Administered 2013-02-07: 5 mg via RESPIRATORY_TRACT
  Filled 2013-02-07 (×2): qty 0.5

## 2013-02-07 MED ORDER — SODIUM CHLORIDE 0.9 % IJ SOLN
3.0000 mL | Freq: Two times a day (BID) | INTRAMUSCULAR | Status: DC
  Administered 2013-02-07 – 2013-02-11 (×8): 3 mL via INTRAVENOUS

## 2013-02-07 MED ORDER — ACETAMINOPHEN 325 MG PO TABS
650.0000 mg | ORAL_TABLET | Freq: Four times a day (QID) | ORAL | Status: DC | PRN
  Administered 2013-02-07 – 2013-02-10 (×3): 650 mg via ORAL
  Filled 2013-02-07 (×3): qty 2

## 2013-02-07 MED ORDER — FUROSEMIDE 10 MG/ML IJ SOLN
40.0000 mg | Freq: Once | INTRAMUSCULAR | Status: AC
  Administered 2013-02-07: 40 mg via INTRAVENOUS
  Filled 2013-02-07: qty 4

## 2013-02-07 MED ORDER — SIMVASTATIN 10 MG PO TABS
10.0000 mg | ORAL_TABLET | Freq: Every day | ORAL | Status: DC
  Administered 2013-02-07 – 2013-02-10 (×4): 10 mg via ORAL
  Filled 2013-02-07 (×5): qty 1

## 2013-02-07 MED ORDER — IPRATROPIUM BROMIDE 0.02 % IN SOLN
0.5000 mg | Freq: Once | RESPIRATORY_TRACT | Status: AC
  Administered 2013-02-07: 0.5 mg via RESPIRATORY_TRACT
  Filled 2013-02-07: qty 2.5

## 2013-02-07 MED ORDER — IPRATROPIUM BROMIDE 0.02 % IN SOLN
0.5000 mg | Freq: Four times a day (QID) | RESPIRATORY_TRACT | Status: DC
  Administered 2013-02-07 – 2013-02-09 (×11): 0.5 mg via RESPIRATORY_TRACT
  Filled 2013-02-07 (×11): qty 2.5

## 2013-02-07 MED ORDER — METHYLPREDNISOLONE SODIUM SUCC 40 MG IJ SOLR
40.0000 mg | INTRAMUSCULAR | Status: DC
  Administered 2013-02-07 – 2013-02-09 (×3): 40 mg via INTRAVENOUS
  Filled 2013-02-07 (×4): qty 1

## 2013-02-07 MED ORDER — CHLORTHALIDONE 25 MG PO TABS
25.0000 mg | ORAL_TABLET | Freq: Every day | ORAL | Status: DC
  Administered 2013-02-07 – 2013-02-10 (×4): 25 mg via ORAL
  Filled 2013-02-07 (×5): qty 1

## 2013-02-07 MED ORDER — BUDESONIDE 0.25 MG/2ML IN SUSP
0.2500 mg | Freq: Two times a day (BID) | RESPIRATORY_TRACT | Status: DC
  Administered 2013-02-07 – 2013-02-11 (×9): 0.25 mg via RESPIRATORY_TRACT
  Filled 2013-02-07 (×11): qty 2

## 2013-02-07 MED ORDER — ISOSORBIDE MONONITRATE ER 30 MG PO TB24
30.0000 mg | ORAL_TABLET | Freq: Every day | ORAL | Status: DC
  Administered 2013-02-07 – 2013-02-11 (×5): 30 mg via ORAL
  Filled 2013-02-07 (×5): qty 1

## 2013-02-07 MED ORDER — CLOPIDOGREL BISULFATE 75 MG PO TABS
75.0000 mg | ORAL_TABLET | Freq: Every day | ORAL | Status: DC
  Administered 2013-02-07 – 2013-02-11 (×5): 75 mg via ORAL
  Filled 2013-02-07 (×6): qty 1

## 2013-02-07 MED ORDER — PANTOPRAZOLE SODIUM 40 MG PO TBEC: 40.0000 mg | DELAYED_RELEASE_TABLET | Freq: Every day | ORAL | Status: DC

## 2013-02-07 MED ORDER — LEVOFLOXACIN IN D5W 250 MG/50ML IV SOLN
250.0000 mg | INTRAVENOUS | Status: DC
  Administered 2013-02-08 – 2013-02-10 (×3): 250 mg via INTRAVENOUS
  Filled 2013-02-07 (×4): qty 50

## 2013-02-07 MED ORDER — FERROUS SULFATE 325 (65 FE) MG PO TABS
325.0000 mg | ORAL_TABLET | Freq: Every day | ORAL | Status: DC
  Administered 2013-02-07 – 2013-02-11 (×5): 325 mg via ORAL
  Filled 2013-02-07 (×6): qty 1

## 2013-02-07 MED ORDER — FAMOTIDINE 20 MG PO TABS
20.0000 mg | ORAL_TABLET | Freq: Two times a day (BID) | ORAL | Status: DC
  Administered 2013-02-07 – 2013-02-11 (×9): 20 mg via ORAL
  Filled 2013-02-07 (×10): qty 1

## 2013-02-07 MED ORDER — ENOXAPARIN SODIUM 40 MG/0.4ML ~~LOC~~ SOLN
40.0000 mg | SUBCUTANEOUS | Status: DC
  Administered 2013-02-07: 40 mg via SUBCUTANEOUS
  Filled 2013-02-07: qty 0.4

## 2013-02-07 MED ORDER — NITROGLYCERIN 0.4 MG SL SUBL: 0.4000 mg | SUBLINGUAL_TABLET | SUBLINGUAL | Status: DC | PRN

## 2013-02-07 MED ORDER — ALBUTEROL SULFATE (5 MG/ML) 0.5% IN NEBU
2.5000 mg | INHALATION_SOLUTION | Freq: Four times a day (QID) | RESPIRATORY_TRACT | Status: DC
  Administered 2013-02-07 – 2013-02-09 (×11): 2.5 mg via RESPIRATORY_TRACT
  Filled 2013-02-07 (×12): qty 0.5

## 2013-02-07 MED ORDER — PANTOPRAZOLE SODIUM 40 MG PO TBEC
40.0000 mg | DELAYED_RELEASE_TABLET | Freq: Every day | ORAL | Status: DC
  Administered 2013-02-07 – 2013-02-11 (×5): 40 mg via ORAL
  Filled 2013-02-07 (×4): qty 1

## 2013-02-07 MED ORDER — DIAZEPAM 5 MG PO TABS
5.0000 mg | ORAL_TABLET | Freq: Two times a day (BID) | ORAL | Status: DC | PRN
  Administered 2013-02-07 – 2013-02-10 (×4): 5 mg via ORAL
  Filled 2013-02-07 (×4): qty 1

## 2013-02-07 MED ORDER — LEVOFLOXACIN IN D5W 500 MG/100ML IV SOLN
500.0000 mg | Freq: Once | INTRAVENOUS | Status: AC
  Administered 2013-02-07: 500 mg via INTRAVENOUS
  Filled 2013-02-07: qty 100

## 2013-02-07 MED ORDER — ACETAMINOPHEN 650 MG RE SUPP: 650.0000 mg | Freq: Four times a day (QID) | RECTAL | Status: DC | PRN

## 2013-02-07 MED ORDER — ALBUTEROL SULFATE (5 MG/ML) 0.5% IN NEBU: 5.0000 mg | INHALATION_SOLUTION | RESPIRATORY_TRACT | Status: DC | PRN

## 2013-02-07 MED ORDER — POTASSIUM CHLORIDE 20 MEQ PO PACK: 20.0000 meq | PACK | Freq: Two times a day (BID) | ORAL | Status: DC

## 2013-02-07 MED ORDER — ONDANSETRON HCL 4 MG PO TABS: 4.0000 mg | ORAL_TABLET | Freq: Four times a day (QID) | ORAL | Status: DC | PRN

## 2013-02-07 MED ORDER — SODIUM CHLORIDE 0.9 % IJ SOLN
3.0000 mL | Freq: Two times a day (BID) | INTRAMUSCULAR | Status: DC
  Administered 2013-02-08 – 2013-02-11 (×6): 3 mL via INTRAVENOUS

## 2013-02-07 MED ORDER — HEPARIN (PORCINE) IN NACL 100-0.45 UNIT/ML-% IJ SOLN
800.0000 [IU]/h | INTRAMUSCULAR | Status: DC
  Administered 2013-02-07: 900 [IU]/h via INTRAVENOUS
  Administered 2013-02-08: 800 [IU]/h via INTRAVENOUS
  Filled 2013-02-07 (×4): qty 250

## 2013-02-07 MED ORDER — ALBUTEROL SULFATE (5 MG/ML) 0.5% IN NEBU: 2.5000 mg | INHALATION_SOLUTION | RESPIRATORY_TRACT | Status: DC | PRN

## 2013-02-07 MED ORDER — METOPROLOL TARTRATE 25 MG PO TABS
25.0000 mg | ORAL_TABLET | Freq: Two times a day (BID) | ORAL | Status: DC
  Administered 2013-02-07 – 2013-02-11 (×9): 25 mg via ORAL
  Filled 2013-02-07 (×10): qty 1

## 2013-02-07 NOTE — Progress Notes (Addendum)
Patient admitted this AM by Dr. Toniann Fail.  Doing well, if this continues, plan to transfer to tele bed on floor this afternoon.  Marlin Canary DO  Addendum: increased troponin- consulted cardiology 4/3- started heparin gtt- will leave in SDU for now

## 2013-02-07 NOTE — Progress Notes (Signed)
  Echocardiogram 2D Echocardiogram has been performed.  [name] 02/07/2013, 11:01 AM

## 2013-02-07 NOTE — ED Provider Notes (Addendum)
History     CSN: [id number]  Arrival date & time 02/07/13  4098   First MD Initiated Contact with Patient 02/07/13 [phone]      Chief Complaint  Patient presents with  . Respiratory Distress    (Consider location/radiation/quality/duration/timing/severity/associated sxs/prior treatment) The history is provided by the patient, the spouse and a relative.  pt with hx ?copd, dypsnea for past couple years, c/o acutely worsening dyspnea tonight after lying down to go to bed. +wheezing. Uses advair daily, uses albuterol on prn basis, has been using a couple times every day. Uses home o2 at night, 2 liters. States compliant w normal meds, no recent change in meds. Denies chest pain or discomfort. No leg pain or swelling. ?hx dvt, no prior hx pe.  Denies hx cad. Denies hx chf, but had been on lasix previously, not currently on. no orthopnea or pnd. Denies cough or uri c/o. No fever or chills.     Past Medical History  Diagnosis Date  . DVT (deep venous thrombosis)   . Anemia   . Back injury   . Acid reflux   . Hypertension   . Hypercholesterolemia   . Anginal pain   . Asthma     Past Surgical History  Procedure Laterality Date  . Abdominal surgery    . Tonsillectomy    . Hernia repair    . Angioplasty      no stent  . Vesicovaginal fistula closure w/ tah    . Breast biopsy      left side  . Knee surgery      left knee  . Leg bypass      left leg    Family History  Problem Relation Age of Onset  . Emphysema Brother     2 brothers  . Emphysema Father   . Heart failure Mother   . Diabetes Mother     History  Substance Use Topics  . Smoking status: Never Smoker   . Smokeless tobacco: Never Used  . Alcohol Use: No    OB History   Grav Para Term Preterm Abortions TAB SAB Ect Mult Living                  Review of Systems  Constitutional: Negative for fever and chills.  HENT: Negative for neck pain.   Eyes: Negative for redness.  Respiratory: Positive for  shortness of breath. Negative for cough.   Cardiovascular: Negative for chest pain and leg swelling.  Gastrointestinal: Negative for vomiting and abdominal pain.  Genitourinary: Negative for dysuria and flank pain.  Musculoskeletal: Negative for back pain.  Skin: Negative for rash.  Neurological: Negative for headaches.  Hematological: Does not bruise/bleed easily.  Psychiatric/Behavioral: Negative for confusion.    Allergies  Sulfa antibiotics  Home Medications   Current Outpatient Rx  Name  Route  Sig  Dispense  Refill  . aspirin 81 MG tablet   Oral   Take 81 mg by mouth daily.         . chlorthalidone (HYGROTON) 25 MG tablet   Oral   Take 25 mg by mouth daily.           . Cholecalciferol (VITAMIN D) 1000 UNITS capsule   Oral   Take 1,000 Units by mouth daily.         . clopidogrel (PLAVIX) 75 MG tablet   Oral   Take 75 mg by mouth daily.         . diazepam (  VALIUM) 5 MG tablet   Oral   Take 5 mg by mouth daily as needed.           . ferrous sulfate 325 (65 FE) MG tablet   Oral   Take 325 mg by mouth daily with breakfast.           . Fluticasone-Salmeterol (ADVAIR DISKUS) 100-50 MCG/DOSE AEPB      1 puff then rise mouth, two times every day   60 each   prn   . furosemide (LASIX) 40 MG tablet   Oral   Take 40 mg by mouth daily.           . isosorbide mononitrate (ISMO,MONOKET) 20 MG tablet   Oral   Take 30 mg by mouth daily.           Marland Kitchen lovastatin (MEVACOR) 20 MG tablet   Oral   Take 20 mg by mouth every morning.          . metoprolol succinate (TOPROL-XL) 25 MG 24 hr tablet   Oral   Take 25 mg by mouth 2 (two) times daily.         . nitroGLYCERIN (NITROSTAT) 0.4 MG SL tablet   Sublingual   Place 0.4 mg under the tongue every 5 (five) minutes as needed.           . potassium chloride (KLOR-CON) 20 MEQ packet   Oral   Take 20 mEq by mouth 2 (two) times daily.         . ranitidine (ZANTAC) 150 MG tablet   Oral   Take 150  mg by mouth 2 (two) times daily.           Marland Kitchen tiotropium (SPIRIVA) 18 MCG inhalation capsule   Inhalation   Place 18 mcg into inhaler and inhale daily.           BP 177/108  Pulse 105  Resp 31  SpO2 100%  Physical Exam  Nursing note and vitals reviewed. Constitutional: She is oriented to person, place, and time. She appears well-developed and well-nourished. She appears distressed.  HENT:  Nose: Nose normal.  Eyes: Conjunctivae are normal. No scleral icterus.  Neck: Neck supple. No JVD present. No tracheal deviation present.  Cardiovascular: Normal rate, regular rhythm, normal heart sounds and intact distal pulses.   Pulmonary/Chest: No respiratory distress.  Tachypneic. Mild wheezing bil.   Abdominal: Soft. Normal appearance and bowel sounds are normal. She exhibits no distension. There is no tenderness.  Musculoskeletal: She exhibits no tenderness.  Trace bil ankle edema.   Neurological: She is alert and oriented to person, place, and time.  Skin: Skin is warm and dry. No rash noted. She is not diaphoretic.    ED Course  Procedures (including critical care time)  Results for orders placed during the hospital encounter of 02/07/13  CBC      Result Value Range   WBC 10.2  4.0 - 10.5 K/uL   RBC 4.38  3.87 - 5.11 MIL/uL   Hemoglobin 12.6  12.0 - 15.0 g/dL   HCT 16.1  09.6 - 04.5 %   MCV 85.6  78.0 - 100.0 fL   MCH 28.8  26.0 - 34.0 pg   MCHC 33.6  30.0 - 36.0 g/dL   RDW 40.9  81.1 - 91.4 %   Platelets 213  150 - 400 K/uL  COMPREHENSIVE METABOLIC PANEL      Result Value Range   Sodium 133 (*) 135 -  145 mEq/L   Potassium 4.3  3.5 - 5.1 mEq/L   Chloride 97  96 - 112 mEq/L   CO2 24  19 - 32 mEq/L   Glucose, Bld 201 (*) 70 - 99 mg/dL   BUN 18  6 - 23 mg/dL   Creatinine, Ser 1.61 (*) 0.50 - 1.10 mg/dL   Calcium 09.6  8.4 - 04.5 mg/dL   Total Protein 6.9  6.0 - 8.3 g/dL   Albumin 3.9  3.5 - 5.2 g/dL   AST 22  0 - 37 U/L   ALT 13  0 - 35 U/L   Alkaline Phosphatase  75  39 - 117 U/L   Total Bilirubin 0.3  0.3 - 1.2 mg/dL   GFR calc non Af Amer 43 (*) >90 mL/min   GFR calc Af Amer 50 (*) >90 mL/min  PRO B NATRIURETIC PEPTIDE      Result Value Range   Pro B Natriuretic peptide (BNP) 803.5 (*) 0 - 450 pg/mL  TROPONIN I      Result Value Range   Troponin I <0.30  <0.30 ng/mL   Dg Chest Port 1 View  02/07/2013  *RADIOLOGY REPORT*  Clinical Data: Respiratory distress.  Shortness of breath.  PORTABLE CHEST - 1 VIEW  Comparison: 11/13/2012  Findings: Enlargement with increased pulmonary vascularity.  New interstitial changes suggesting interstitial edema.  No blunting of costophrenic angles.  No pneumothorax.  Calcified and tortuous aorta.  IMPRESSION: Interval development of cardiac enlargement with pulmonary vascular congestion and interstitial edema.   Original Report Authenticated By: Burman Nieves, M.D.        MDM  Iv ns. O2. Continuous pulse ox and monitor.  bipap continued in ed. Respiratory therapy called to assist.  Reviewed nursing notes and prior charts for additional history.   Albuterol and atrovent neb.  ems had given neb and solumedrol pta.   Date: 02/07/2013  Rate: 99  Rhythm: normal sinus rhythm  QRS Axis: normal  Intervals: normal  ST/T Wave abnormalities: nonspecific ST changes  Conduction Disutrbances:none  Narrative Interpretation:   Old EKG Reviewed: unchanged  After period of time on bipap, nt paste for bp/chf, lasix iv, pts resp status slowly improved, less tachypneic, breathing easier.  Med service called for admit.   Med service requests step down, team 10.  CRITICAL CARE Performed by: Suzi Roots   Total critical care time: 35  Critical care time was exclusive of separately billable procedures and treating other patients.  Critical care was necessary to treat or prevent imminent or life-threatening deterioration.  Critical care was time spent personally by me on the following activities: development  of treatment plan with patient and/or surrogate as well as nursing, discussions with consultants, evaluation of patient's response to treatment, examination of patient, obtaining history from patient or surrogate, ordering and performing treatments and interventions, ordering and review of laboratory studies, ordering and review of radiographic studies, pulse oximetry and re-evaluation of patient's condition.         Suzi Roots, MD 02/07/13 4098  Suzi Roots, MD 02/07/13 [phone]

## 2013-02-07 NOTE — Care Management Note (Signed)
    Page 1 of 1   02/07/2013     11:44:38 AM   CARE MANAGEMENT NOTE 02/07/2013  Patient:  [name], [name]   Account Number:  [id number]  Date Initiated:  02/07/2013  Documentation initiated by:  Junius Creamer  Subjective/Objective Assessment:   adm w shortness of breath     Action/Plan:   lives w husband, pcp dr Danise Edge   Anticipated DC Date:     Anticipated DC Plan:        DC Planning Services  CM consult      Choice offered to / List presented to:             Status of service:   Medicare Important Message given?   (If response is "NO", the following Medicare IM given date fields will be blank) Date Medicare IM given:   Date Additional Medicare IM given:    Discharge Disposition:    Per UR Regulation:  Reviewed for med. necessity/level of care/duration of stay  If discussed at Long Length of Stay Meetings, dates discussed:    Comments:  4/3 1143a debbie Gurfateh Mcclain rn,bsn

## 2013-02-07 NOTE — ED Notes (Signed)
Patient called EMS tonight for sob, patient initially to have 78% O2 sats on home oxygen at 1.5 liters, patient placed on NRB and given neb treatment, sats to high 80's, patient then placed on CPAP and sats currently mid 90's,

## 2013-02-07 NOTE — H&P (Signed)
[facility] History and Physical  [name] ZOX:[id number] DOB: 12-Dec-1925 DOA: 02/07/2013  Referring physician: Dr. Leo Rod. PCP: [name], MD  Specialists: Dr. Eldridge Dace.  Chief Complaint: Shortness of breath.  HPI: [name] is a 79 y.o. female with history of CAD, hypertension, hyperlipidemia, COPD/asthma presented to the ER because of sudden onset of shortness of breath today morning 2 AM which woke her up from sleep. EMS on arrival found patient to be short of breath and wheezing and had initially placed patient on nebulizer and IV steroids and was brought to the ER. Patient was placed on BiPAP. Chest x-ray shows congestion and cardiomegaly. At this time one dose of IV Lasix 40 mg has been ordered by the ER physician and patient has been admitted for further management. Patient denies any chest pain but she does have chronic epigastric discomfort. Denies any nausea vomiting diarrhea. Since admission patient states her shortness of breath has improved but still mildly short of breath. Presently on BiPAP and plans to wean her off. EKG is unremarkable cardiac enzymes have been negative and BNP is elevated.  Review of Systems: As presented in the history of presenting illness, rest negative.  Past Medical History  Diagnosis Date  . DVT (deep venous thrombosis)   . Anemia   . Back injury   . Acid reflux   . Hypertension   . Hypercholesterolemia   . Anginal pain   . Asthma    Past Surgical History  Procedure Laterality Date  . Abdominal surgery    . Tonsillectomy    . Hernia repair    . Angioplasty      no stent  . Vesicovaginal fistula closure w/ tah    . Breast biopsy      left side  . Knee surgery      left knee  . Leg bypass      left leg   Social History:  reports that she has never smoked. She has never used smokeless tobacco. She reports that she does not drink alcohol or use illicit drugs. Lives at home with her husband. where does patient  live-- Can do ADLs. Can patient participate in ADLs?  Allergies  Allergen Reactions  . Sulfa Antibiotics Other (See Comments)    unknown    Family History  Problem Relation Age of Onset  . Emphysema Brother     2 brothers  . Emphysema Father   . Heart failure Mother   . Diabetes Mother       Prior to Admission medications   Medication Sig Start Date End Date Taking? Authorizing Provider  albuterol (PROVENTIL HFA;VENTOLIN HFA) 108 (90 BASE) MCG/ACT inhaler Inhale 2 puffs into the lungs every 6 (six) hours as needed for wheezing.   Yes Historical Provider, MD  aspirin 81 MG tablet Take 81 mg by mouth daily.   Yes Historical Provider, MD  chlorthalidone (HYGROTON) 25 MG tablet Take 25 mg by mouth daily.     Yes Historical Provider, MD  Cholecalciferol (VITAMIN D) 1000 UNITS capsule Take 1,000 Units by mouth daily.   Yes Historical Provider, MD  clopidogrel (PLAVIX) 75 MG tablet Take 75 mg by mouth daily.   Yes Historical Provider, MD  diazepam (VALIUM) 5 MG tablet Take 5 mg by mouth daily as needed.     Yes Historical Provider, MD  ferrous sulfate 325 (65 FE) MG tablet Take 325 mg by mouth daily with breakfast.     Yes Historical Provider, MD  Fluticasone-Salmeterol (  ADVAIR DISKUS) 100-50 MCG/DOSE AEPB 1 puff then rise mouth, two times every day 07/12/12 07/12/13 Yes Clinton D Young, MD  isosorbide mononitrate (IMDUR) 30 MG 24 hr tablet Take 30 mg by mouth daily.   Yes Historical Provider, MD  lovastatin (MEVACOR) 20 MG tablet Take 20 mg by mouth every morning.    Yes Historical Provider, MD  metoprolol tartrate (LOPRESSOR) 25 MG tablet Take 25 mg by mouth 2 (two) times daily.   Yes Historical Provider, MD  pantoprazole (PROTONIX) 40 MG tablet Take 40 mg by mouth daily.   Yes Historical Provider, MD  potassium chloride (KLOR-CON) 20 MEQ packet Take 20 mEq by mouth 2 (two) times daily.   Yes Historical Provider, MD  ranitidine (ZANTAC) 150 MG tablet Take 150 mg by mouth 2 (two) times daily.      Yes Historical Provider, MD  tiotropium (SPIRIVA) 18 MCG inhalation capsule Place 18 mcg into inhaler and inhale daily.   Yes Historical Provider, MD  nitroGLYCERIN (NITROSTAT) 0.4 MG SL tablet Place 0.4 mg under the tongue every 5 (five) minutes as needed.      Historical Provider, MD   Physical Exam: Filed Vitals:   02/07/13 0545 02/07/13 0600 02/07/13 0614 02/07/13 0615  BP: 147/87 149/84 149/84 149/75  Pulse: 95 96 98 97  Temp:      TempSrc:      Resp: 22 26 28 30   SpO2: 98% 99%  95%     General:  Well-developed well-nourished.  Eyes: Anicteric no pallor.  ENT: No discharge from ears eyes nose and mouth.  Neck: No mass felt.  Cardiovascular: S1-S2 heard.  Respiratory: Bilateral expiratory wheeze heard. No crepitations.  Abdomen: Soft nontender bowel sounds present.  Skin: No rash.  Musculoskeletal: No edema.  Psychiatric: Appears normal.  Neurologic: Alert awake oriented to time place and person. Moves all extremities.  Labs on Admission:  Basic Metabolic Panel:  Recent Labs Lab 02/07/13 0424  NA 133*  K 4.3  CL 97  CO2 24  GLUCOSE 201*  BUN 18  CREATININE 1.13*  CALCIUM 10.3   Liver Function Tests:  Recent Labs Lab 02/07/13 0424  AST 22  ALT 13  ALKPHOS 75  BILITOT 0.3  PROT 6.9  ALBUMIN 3.9   No results found for this basename: LIPASE, AMYLASE,  in the last 168 hours No results found for this basename: AMMONIA,  in the last 168 hours CBC:  Recent Labs Lab 02/07/13 0424  WBC 10.2  HGB 12.6  HCT 37.5  MCV 85.6  PLT 213   Cardiac Enzymes:  Recent Labs Lab 02/07/13 0424  TROPONINI <0.30    BNP (last 3 results)  Recent Labs  02/07/13 0424  PROBNP 803.5*   CBG: No results found for this basename: GLUCAP,  in the last 168 hours  Radiological Exams on Admission: Dg Chest Port 1 View  02/07/2013  *RADIOLOGY REPORT*  Clinical Data: Respiratory distress.  Shortness of breath.  PORTABLE CHEST - 1 VIEW  Comparison:  11/13/2012  Findings: Enlargement with increased pulmonary vascularity.  New interstitial changes suggesting interstitial edema.  No blunting of costophrenic angles.  No pneumothorax.  Calcified and tortuous aorta.  IMPRESSION: Interval development of cardiac enlargement with pulmonary vascular congestion and interstitial edema.   Original Report Authenticated By: Burman Nieves, M.D.     EKG: Independently reviewed. Normal sinus rhythm.  Assessment/Plan Principal Problem:   Acute respiratory failure Active Problems:   Peripheral arterial disease   Coronary artery disease  Asthma   CHF (congestive heart failure)   1. Acute respiratory failure most likely from a combination of COPD/asthma versus CHF - we will continue with nebulizer Pulmicort and steroids and empiric antibiotics for possible COPD/asthma. Patient did receive one dose of IV Lasix and further doses to be determined based on clinical response metabolic panel and intake output. Check 2-D echo. Cycle cardiac markers. 2. History of CAD - denies any chest pain. Given acute presentation of shortness of breath we'll cycle cardiac markers. Continue antiplatelet agents. 3. Peripheral vascular disease status post femoropopliteal bypass - continue antiplatelet agents. 4. Epigastric discomfort - patient's daughter states it's been chronic. Placed on Protonix. 5. Hypertension - continue home medications. 6. Hyperlipidemia -continue home medications.     Code Status: Full code. Family would like to discuss at this admission further about the CODE STATUS.  Family Communication: Patient's daughter and husband at the bedside.  Disposition Plan: Admit to inpatient.    Alastor Kneale N. [facility] Pager [phone].  If 7PM-7AM, please contact night-coverage [email or web address] Password [email or web address] 02/07/2013, 6:20 AM

## 2013-02-07 NOTE — Consult Note (Signed)
Admit date: 02/07/2013 Referring Physician: Dr. Benjamine Mola Primary Physician [name], MD Primary Cardiologist  Dr. Eldridge Dace Reason for Consultation  Elevated troponin.   HPI: 79 year old female with known coronary artery disease with LAD occlusion filled via collateral blood flow, last catheterization 02/03/2011 in the setting of unstable angina, who presented with increasing shortness of breath. Her left ventriculogram at the setting of catheterization demonstrated mild anterior wall hypokinesis but was overall felt to be in the EF 55% range.  She had sudden onset shortness of breath earlier this morning and approximally 2 AM. Had trouble getting to sleep last night. EMS was called and found her to be quite short of breath and wheezing initially placed her on nebulizer as well as IV steroids. She has a history of chronic COPD/asthma. Interestingly, never smoked. BiPAP was placed. Chest x-ray demonstrated evidence of pulmonary congestion as well as cardiomegaly. She denies any chest pain currently but she does have chronic epigastric discomfort. No recent hematemesis, nausea, vomiting, diarrhea. Currently her shortness of breath is improved. BNP was elevated at 803 and second troponin gathered at 12 noon was slightly elevated at 0.44. This was the reason for consultation. IV heparin was initiated.  I personally reviewed her echocardiogram and she doesn't fact have anterior septal wall hypokinesis and her ejection fraction appears to be mildly to moderately reduced in the 40-50% range. There is not currently an EKG scanned in system.  She is a history of peripheral vascular disease status post femoral-popliteal bypass.   PMH:   Past Medical History  Diagnosis Date  . DVT (deep venous thrombosis)   . Anemia   . Back injury   . Acid reflux   . Hypertension   . Hypercholesterolemia   . Anginal pain   . Asthma     PSH:   Past Surgical History  Procedure Laterality Date  . Abdominal surgery     . Tonsillectomy    . Hernia repair    . Angioplasty      no stent  . Vesicovaginal fistula closure w/ tah    . Breast biopsy      left side  . Knee surgery      left knee  . Leg bypass      left leg   Allergies:  Sulfa antibiotics Prior to Admit Meds:   Prescriptions prior to admission  Medication Sig Dispense Refill  . albuterol (PROVENTIL HFA;VENTOLIN HFA) 108 (90 BASE) MCG/ACT inhaler Inhale 2 puffs into the lungs every 6 (six) hours as needed for wheezing.      Marland Kitchen aspirin 81 MG tablet Take 81 mg by mouth daily.      . chlorthalidone (HYGROTON) 25 MG tablet Take 25 mg by mouth daily.        . Cholecalciferol (VITAMIN D) 1000 UNITS capsule Take 1,000 Units by mouth daily.      . clopidogrel (PLAVIX) 75 MG tablet Take 75 mg by mouth daily.      . diazepam (VALIUM) 5 MG tablet Take 5 mg by mouth daily as needed.        . ferrous sulfate 325 (65 FE) MG tablet Take 325 mg by mouth daily with breakfast.        . Fluticasone-Salmeterol (ADVAIR DISKUS) 100-50 MCG/DOSE AEPB 1 puff then rise mouth, two times every day  60 each  prn  . isosorbide mononitrate (IMDUR) 30 MG 24 hr tablet Take 30 mg by mouth daily.      Marland Kitchen lovastatin (MEVACOR) 20 MG  tablet Take 20 mg by mouth every morning.       . metoprolol tartrate (LOPRESSOR) 25 MG tablet Take 25 mg by mouth 2 (two) times daily.      . pantoprazole (PROTONIX) 40 MG tablet Take 40 mg by mouth daily.      . potassium chloride (KLOR-CON) 20 MEQ packet Take 20 mEq by mouth 2 (two) times daily.      . ranitidine (ZANTAC) 150 MG tablet Take 150 mg by mouth 2 (two) times daily.        Marland Kitchen tiotropium (SPIRIVA) 18 MCG inhalation capsule Place 18 mcg into inhaler and inhale daily.      . nitroGLYCERIN (NITROSTAT) 0.4 MG SL tablet Place 0.4 mg under the tongue every 5 (five) minutes as needed.         Fam HX:    Family History  Problem Relation Age of Onset  . Emphysema Brother     2 brothers  . Emphysema Father   . Heart failure Mother   .  Diabetes Mother    Social HX:    History   Social History  . Marital Status: Married    Spouse Name: N/A    Number of Children: 2  . Years of Education: N/A   Occupational History  . 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 Smoking status: Never Smoker   . Smokeless tobacco: Never Used  . Alcohol Use: No  . Drug Use: No  . Sexually Active: Not on file   Other Topics Concern  . Not on file   Social History Narrative  . No narrative on file     ROS:  All 11 ROS were addressed and are negative except what is stated in the HPI  Physical Exam: Blood pressure 132/100, pulse 102, temperature 99.7 F (37.6 C), temperature source Oral, resp. rate 18, height 5\' 1"  (1.549 m), weight 73 kg (160 lb 15 oz), SpO2 95.00%.    General: Well developed, well nourished, in no acute distress Head: Eyes PERRLA, No xanthomas.   Normal cephalic and atramatic  Lungs:  Bilateral expiratory wheezes noted. Mildly increased work of breathing currently. Heart:   HRRR S1 S2 Pulses are 2+ & equal. No significant murmurs.     No carotid bruit. No JVD.  No abdominal bruits. No femoral bruits. Abdomen: Bowel sounds are positive, abdomen soft and non-tender without masses. No hepatosplenomegaly. Msk:  Back normal. Normal strength and tone for age. Extremities:   No clubbing, cyanosis or edema.  DP +1 Neuro: Alert and oriented X 3, non-focal, MAE x 4 GU: Deferred Rectal: Deferred Psych:  Good affect, responds appropriately    Labs:   Lab Results  Component Value Date   WBC 10.2 02/07/2013   HGB 12.6 02/07/2013   HCT 37.5 02/07/2013   MCV 85.6 02/07/2013   PLT 213 02/07/2013    Recent Labs Lab 02/07/13 0424  NA 133*  K 4.3  CL 97  CO2 24  BUN 18  CREATININE 1.13*  CALCIUM 10.3  PROT 6.9  BILITOT 0.3  ALKPHOS 75  ALT 13  AST 22  GLUCOSE 201*   No results found for this basename: PTT   Lab Results  Component Value Date   INR 0.91 07/01/2011   INR 0.87 02/03/2011   Lab  Results  Component Value Date   CKTOTAL 60 07/18/2011   CKMB 2.4 07/18/2011   TROPONINI 0.44* 02/07/2013  Lab Results  Component Value Date   CHOL 148 07/02/2011   Lab Results  Component Value Date   HDL 32* 07/02/2011   Lab Results  Component Value Date   LDLCALC 81 07/02/2011   Lab Results  Component Value Date   TRIG 175* 07/02/2011   Lab Results  Component Value Date   CHOLHDL 4.6 07/02/2011   No results found for this basename: LDLDIRECT      Radiology:  Dg Chest Port 1 View  02/07/2013  *RADIOLOGY REPORT*  Clinical Data: Respiratory distress.  Shortness of breath.  PORTABLE CHEST - 1 VIEW  Comparison: 11/13/2012  Findings: Enlargement with increased pulmonary vascularity.  New interstitial changes suggesting interstitial edema.  No blunting of costophrenic angles.  No pneumothorax.  Calcified and tortuous aorta.  IMPRESSION: Interval development of cardiac enlargement with pulmonary vascular congestion and interstitial edema.   Original Report Authenticated By: Burman Nieves, M.D.    Personally viewed.  EKG:  Ordered.   ASSESSMENT/PLAN:   79 year old with known LAD occlusion/coronary artery disease with current echocardiogram demonstrating ejection fraction in the 40-50% range with anteroseptal wall hypokinesis, chronic peripheral arterial disease status post femoral-popliteal bypass, COPD admitted with symptoms consistent with COPD exacerbation now found to have mildly elevated troponin of 0.44.  1. Elevated troponin-this is likely secondary to a combination of COPD/asthma exacerbation and acute systolic/diastolic heart failure. BNP is slightly elevated at 803. She did respond to both modalities of treatment with nebulizer, steroids, IV Lasix. Continued to trend cardiac markers. If markers are significantly elevated, one could consider coronary angiogram however at this point, I would promote medical management. She has known LAD occlusion.  Continue with IV diuretics.  Monitor potassium, creatinine. Continue with metoprolol 25 mg twice a day. Consider addition of angiotensin receptor blocker or ACE inhibitor if able to tolerate.  2. CAD-as described above in catheterization last done in 2012. Continuing long acting nitrates, beta blocker.  3. Hypertension-continue to monitor, medications  4. Hyperlipidemia-continue home therapy.  5. Peripheral arterial disease - previously described occluded fem-fem graft in description of catheterization in 2012. On clopidogrel 75 mg. Aspirin 325 mg.  I will relay to Dr. Eldridge Dace.   Donato Schultz, MD  02/07/2013  3:19 PM

## 2013-02-07 NOTE — Progress Notes (Signed)
Troponin- 0.44, md aware no order given.

## 2013-02-07 NOTE — Progress Notes (Signed)
ANTIBIOTIC CONSULT NOTE - INITIAL  Pharmacy Consult for levofloxacin Indication: R/o COPD / bronchitis  Allergies  Allergen Reactions  . Sulfa Antibiotics Other (See Comments)    unknown    Patient Measurements: Height: 5\' 1"  (154.9 cm) Weight: 160 lb 15 oz (73 kg) IBW/kg (Calculated) : 47.8   Vital Signs: Temp: 98.2 F (36.8 C) (04/03 0702) Temp src: Oral (04/03 0702) BP: 117/71 mmHg (04/03 0702) Pulse Rate: 96 (04/03 0702) Intake/Output from previous day:   Intake/Output from this shift:    Labs:  Recent Labs  02/07/13 0424  WBC 10.2  HGB 12.6  PLT 213  CREATININE 1.13*   Estimated Creatinine Clearance: 33.9 ml/min (by C-G formula based on Cr of 1.13). No results found for this basename: VANCOTROUGH, VANCOPEAK, VANCORANDOM, GENTTROUGH, GENTPEAK, GENTRANDOM, TOBRATROUGH, TOBRAPEAK, TOBRARND, AMIKACINPEAK, AMIKACINTROU, AMIKACIN,  in the last 72 hours   Microbiology: No results found for this or any previous visit (from the past 720 hour(s)).  Medical History: Past Medical History  Diagnosis Date  . DVT (deep venous thrombosis)   . Anemia   . Back injury   . Acid reflux   . Hypertension   . Hypercholesterolemia   . Anginal pain   . Asthma     Medications:  Scheduled:  . albuterol  2.5 mg Nebulization Q6H  . [COMPLETED] albuterol  5 mg Nebulization Once  . aspirin EC  325 mg Oral Daily  . budesonide (PULMICORT) nebulizer solution  0.25 mg Nebulization BID  . chlorthalidone  25 mg Oral Daily  . clopidogrel  75 mg Oral Q breakfast  . enoxaparin (LOVENOX) injection  40 mg Subcutaneous Q24H  . famotidine  20 mg Oral BID  . ferrous sulfate  325 mg Oral Q breakfast  . [COMPLETED] furosemide  40 mg Intravenous Once  . [COMPLETED] ipratropium  0.5 mg Nebulization Once  . ipratropium  0.5 mg Nebulization Q6H  . isosorbide mononitrate  30 mg Oral Daily  . methylPREDNISolone (SOLU-MEDROL) injection  40 mg Intravenous Q24H  . metoprolol tartrate  25 mg Oral  BID  . [COMPLETED] nitroGLYCERIN  1 inch Topical Once  . pantoprazole  40 mg Oral Daily  . potassium chloride  20 mEq Oral BID  . simvastatin  10 mg Oral q1800  . sodium chloride  3 mL Intravenous Q12H  . sodium chloride  3 mL Intravenous Q12H  . [DISCONTINUED] aspirin  81 mg Oral Daily  . [DISCONTINUED] pantoprazole  40 mg Oral Q1200  . [DISCONTINUED] potassium chloride  20 mEq Oral BID   Assessment: 79 yo female admitted with acute respiratory failure. Pharmacy consulted to manage levofloxacin for possible COPD / bronchitis.   Plan:  1. Levofloxacin 500mg  IV x 1, then 250mg  IV Q24H.  [name] 02/07/2013,7:10 AM

## 2013-02-07 NOTE — Consult Note (Signed)
ANTICOAGULATION CONSULT NOTE - Initial Consult  Pharmacy Consult for Heparin Indication: chest pain/ACS  Allergies  Allergen Reactions  . Sulfa Antibiotics Other (See Comments)    unknown    Patient Measurements: Height: 5\' 1"  (154.9 cm) Weight: 160 lb 15 oz (73 kg) IBW/kg (Calculated) : 47.8 Heparin Dosing Weight: ~72kg  Vital Signs: Temp: 99.7 F (37.6 C) (04/03 1200) Temp src: Oral (04/03 1200) BP: 132/100 mmHg (04/03 1229) Pulse Rate: 102 (04/03 1200)  Labs:  Recent Labs  02/07/13 0424 02/07/13 1222  HGB 12.6  --   HCT 37.5  --   PLT 213  --   CREATININE 1.13*  --   TROPONINI <0.30 0.44*    Estimated Creatinine Clearance: 33.9 ml/min (by C-G formula based on Cr of 1.13).   Medical History: Past Medical History  Diagnosis Date  . DVT (deep venous thrombosis)   . Anemia   . Back injury   . Acid reflux   . Hypertension   . Hypercholesterolemia   . Anginal pain   . Asthma    Assessment: 86yof previously on lovenox for DVT prophylaxis (last dose today @ [phone]) now to switch to IV heparin for positive troponin. CBC wnl. SCr 1.13 with resulting CrCl ~ 47ml/min.  Goal of Therapy:  Heparin level 0.3-0.7 units/ml Monitor platelets by anticoagulation protocol: Yes   Plan:  1) NO bolus 2) Start heparin at 900 units/hr (~12units/kg/hr) 3) 8 hour heparin level 4) Daily heparin level and CBC  Fredrik Rigger 02/07/2013,2:46 PM

## 2013-02-08 DIAGNOSIS — I5021 Acute systolic (congestive) heart failure: Secondary | ICD-10-CM

## 2013-02-08 LAB — CK TOTAL AND CKMB (NOT AT ARMC)
CK, MB: 5.5 ng/mL — ABNORMAL HIGH (ref 0.3–4.0)
Relative Index: INVALID (ref 0.0–2.5)
Total CK: 93 U/L (ref 7–177)

## 2013-02-08 LAB — CBC
HCT: 33.5 % — ABNORMAL LOW (ref 36.0–46.0)
Hemoglobin: 11.6 g/dL — ABNORMAL LOW (ref 12.0–15.0)
MCV: 82.7 fL (ref 78.0–100.0)
RDW: 14.4 % (ref 11.5–15.5)
WBC: 8.5 10*3/uL (ref 4.0–10.5)

## 2013-02-08 LAB — HEPARIN LEVEL (UNFRACTIONATED)
Heparin Unfractionated: 0.65 IU/mL (ref 0.30–0.70)
Heparin Unfractionated: 0.65 IU/mL (ref 0.30–0.70)

## 2013-02-08 LAB — BASIC METABOLIC PANEL
CO2: 24 mEq/L (ref 19–32)
Chloride: 93 mEq/L — ABNORMAL LOW (ref 96–112)
Creatinine, Ser: 1.13 mg/dL — ABNORMAL HIGH (ref 0.50–1.10)
GFR calc Af Amer: 50 mL/min — ABNORMAL LOW (ref >90)
Potassium: 4.1 mEq/L (ref 3.5–5.1)

## 2013-02-08 MED ORDER — FUROSEMIDE 40 MG PO TABS
40.0000 mg | ORAL_TABLET | Freq: Every day | ORAL | Status: DC
  Administered 2013-02-08 – 2013-02-09 (×2): 40 mg via ORAL
  Filled 2013-02-08 (×3): qty 1

## 2013-02-08 NOTE — Progress Notes (Signed)
[facility] PROGRESS NOTE  [name] XBJ:[id number] DOB: [birth year] DOA: 02/07/2013 PCP: [name], MD  Assessment/Plan: 1.  Acute respiratory failure most likely from a combination of COPD/asthma versus CHF -  nebulizer Pulmicort and steroids and empiric antibiotics for possible COPD/asthma. Change to PO lasix as negative.  2-D echo done. CE with slight increase and cardiology consulted- plan to d/c heparin in AM  2. History of CAD - denies any chest pain.   3.  Peripheral vascular disease status post femoropopliteal bypass - continue antiplatelet agents.   4.  Epigastric discomfort - patient's daughter states it's been chronic. Placed on Protonix.   5.  Hypertension - continue home medications.   6.  Hyperlipidemia -continue home medications   Code Status: full Family Communication:  Disposition Plan:    Consultants:  cardiology  Procedures:  echo  Antibiotics:    HPI/Subjective: Breathing is better today  Objective: Filed Vitals:   02/07/13 2328 02/08/13 0122 02/08/13 0345 02/08/13 0722  BP: 156/82  136/80   Pulse: 87   99  Temp: 98.6 F (37 C)  98.3 F (36.8 C)   TempSrc: Oral  Oral   Resp: 27  21   Height:      Weight:   71.9 kg (158 lb 8.2 oz)   SpO2: 95% 97% 96%     Intake/Output Summary (Last 24 hours) at 02/08/13 3086 Last data filed at 02/08/13 0700  Gross per 24 hour  Intake 1182.17 ml  Output   1925 ml  Net -742.83 ml   Filed Weights   02/07/13 0645 02/07/13 0702 02/08/13 0345  Weight: 73 kg (160 lb 15 oz) 73 kg (160 lb 15 oz) 71.9 kg (158 lb 8.2 oz)    Exam:   General:  A+Ox3, NAD  Cardiovascular: reg  Respiratory: clear  Abdomen: +Bs, soft, NT  Musculoskeletal: moves all 4 ext   Data Reviewed: Basic Metabolic Panel:  Recent Labs Lab 02/07/13 0424 02/08/13 0402  NA 133* 129*  K 4.3 4.1  CL 97 93*  CO2 24 24  GLUCOSE 201* 143*  BUN 18 23  CREATININE 1.13* 1.13*  CALCIUM 10.3 10.1   Liver  Function Tests:  Recent Labs Lab 02/07/13 0424  AST 22  ALT 13  ALKPHOS 75  BILITOT 0.3  PROT 6.9  ALBUMIN 3.9   No results found for this basename: LIPASE, AMYLASE,  in the last 168 hours No results found for this basename: AMMONIA,  in the last 168 hours CBC:  Recent Labs Lab 02/07/13 0424 02/08/13 0402  WBC 10.2 8.5  HGB 12.6 11.6*  HCT 37.5 33.5*  MCV 85.6 82.7  PLT 213 200   Cardiac Enzymes:  Recent Labs Lab 02/07/13 0424 02/07/13 1222 02/07/13 1959 02/08/13 0408  CKTOTAL  --   --   --  93  CKMB  --   --   --  5.5*  TROPONINI <0.30 0.44* 0.49*  --    BNP (last 3 results)  Recent Labs  02/07/13 0424  PROBNP 803.5*   CBG: No results found for this basename: GLUCAP,  in the last 168 hours  No results found for this or any previous visit (from the past 240 hour(s)).   Studies: Dg Chest Port 1 View  02/07/2013  *RADIOLOGY REPORT*  Clinical Data: Respiratory distress.  Shortness of breath.  PORTABLE CHEST - 1 VIEW  Comparison: 11/13/2012  Findings: Enlargement with increased pulmonary vascularity.  New interstitial changes suggesting interstitial edema.  No  blunting of costophrenic angles.  No pneumothorax.  Calcified and tortuous aorta.  IMPRESSION: Interval development of cardiac enlargement with pulmonary vascular congestion and interstitial edema.   Original Report Authenticated By: Burman Nieves, M.D.     Scheduled Meds: . albuterol  2.5 mg Nebulization Q6H  . aspirin EC  325 mg Oral Daily  . budesonide (PULMICORT) nebulizer solution  0.25 mg Nebulization BID  . chlorthalidone  25 mg Oral Daily  . clopidogrel  75 mg Oral Q breakfast  . famotidine  20 mg Oral BID  . ferrous sulfate  325 mg Oral Q breakfast  . ipratropium  0.5 mg Nebulization Q6H  . isosorbide mononitrate  30 mg Oral Daily  . levofloxacin (LEVAQUIN) IV  250 mg Intravenous Q24H  . methylPREDNISolone (SOLU-MEDROL) injection  40 mg Intravenous Q24H  . metoprolol tartrate  25 mg Oral  BID  . pantoprazole  40 mg Oral Daily  . potassium chloride  20 mEq Oral BID  . simvastatin  10 mg Oral q1800  . sodium chloride  3 mL Intravenous Q12H  . sodium chloride  3 mL Intravenous Q12H   Continuous Infusions: . heparin 800 Units/hr (02/08/13 0510)    Principal Problem:   Acute respiratory failure Active Problems:   Peripheral arterial disease   Coronary artery disease   Asthma   CHF (congestive heart failure)   Acute systolic heart failure    Time spent: 35    [facility], Leah Skora  [facility] Pager [phone]. If 7PM-7AM, please contact night-coverage at [email or web address], password [facility] 02/08/2013, 8:22 AM  LOS: 1 day

## 2013-02-08 NOTE — Progress Notes (Signed)
SUBJECTIVE:  No chest pain.  Breathing better.  OBJECTIVE:   Vitals:   Filed Vitals:   02/07/13 2328 02/08/13 0122 02/08/13 0345 02/08/13 0722  BP: 156/82  136/80   Pulse: 87   99  Temp: 98.6 F (37 C)  98.3 F (36.8 C)   TempSrc: Oral  Oral   Resp: 27  21   Height:      Weight:   71.9 kg (158 lb 8.2 oz)   SpO2: 95% 97% 96%    I&O's:   Intake/Output Summary (Last 24 hours) at 02/08/13 0831 Last data filed at 02/08/13 0700  Gross per 24 hour  Intake 1182.17 ml  Output   1925 ml  Net -742.83 ml   TELEMETRY: Reviewed telemetry pt in NSR:     PHYSICAL EXAM General: Well developed, well nourished, in no acute distress Head:    Normal cephalic and atramatic  Lungs:  Bibasilar crackles Heart:   HRRR S1 S2  Abdomen:  abdomen soft and non-tender  Msk:   Normal strength and tone for age. Extremities:  1+ left leg edema.  DP +1 Neuro: Alert and oriented  Psych:  Normal affect, responds appropriately   LABS: Basic Metabolic Panel:  Recent Labs  09/02/24 0424 02/08/13 0402  NA 133* 129*  K 4.3 4.1  CL 97 93*  CO2 24 24  GLUCOSE 201* 143*  BUN 18 23  CREATININE 1.13* 1.13*  CALCIUM 10.3 10.1   Liver Function Tests:  Recent Labs  02/07/13 0424  AST 22  ALT 13  ALKPHOS 75  BILITOT 0.3  PROT 6.9  ALBUMIN 3.9   No results found for this basename: LIPASE, AMYLASE,  in the last 72 hours CBC:  Recent Labs  02/07/13 0424 02/08/13 0402  WBC 10.2 8.5  HGB 12.6 11.6*  HCT 37.5 33.5*  MCV 85.6 82.7  PLT 213 200   Cardiac Enzymes:  Recent Labs  02/07/13 0424 02/07/13 1222 02/07/13 1959 02/08/13 0408  CKTOTAL  --   --   --  93  CKMB  --   --   --  5.5*  TROPONINI <0.30 0.44* 0.49*  --    BNP: No components found with this basename: POCBNP,  D-Dimer: No results found for this basename: DDIMER,  in the last 72 hours Hemoglobin A1C: No results found for this basename: HGBA1C,  in the last 72 hours Fasting Lipid Panel: No results found for this  basename: CHOL, HDL, LDLCALC, TRIG, CHOLHDL, LDLDIRECT,  in the last 72 hours Thyroid Function Tests: No results found for this basename: TSH, T4TOTAL, FREET3, T3FREE, THYROIDAB,  in the last 72 hours Anemia Panel: No results found for this basename: VITAMINB12, FOLATE, FERRITIN, TIBC, IRON, RETICCTPCT,  in the last 72 hours Coag Panel:   Lab Results  Component Value Date   INR 0.91 07/01/2011   INR 0.87 02/03/2011    RADIOLOGY: Dg Chest Port 1 View  02/07/2013  *RADIOLOGY REPORT*  Clinical Data: Respiratory distress.  Shortness of breath.  PORTABLE CHEST - 1 VIEW  Comparison: 11/13/2012  Findings: Enlargement with increased pulmonary vascularity.  New interstitial changes suggesting interstitial edema.  No blunting of costophrenic angles.  No pneumothorax.  Calcified and tortuous aorta.  IMPRESSION: Interval development of cardiac enlargement with pulmonary vascular congestion and interstitial edema.   Original Report Authenticated By: Burman Nieves, M.D.       ASSESSMENT: Acute systolic heart failure, CAD, positive troponin  PLAN:  Continue IV diuretics.  Wean off of  oxygen.  Would have PT ambulate as well to see if oxygen sats are ok.    CTO LAD documented in 2012.  No angina at this time.  Troponin is likely from heart failure.  Overall, she is not very active.  I have spoken with her daughter in the past and we have been in agreement about just trying medical therapy given her overall health.  She will likely need at least Lasix 40-80 mg daily at home on discharge.  ACE-I and beta blocker as tolerated.  D/C heparin in AM.  I do not think this is true plaque rupture MI.  Corky Crafts., MD  02/08/2013  8:31 AM

## 2013-02-08 NOTE — Consult Note (Addendum)
ANTICOAGULATION CONSULT NOTE - Follow Up Consult  Pharmacy Consult for Heparin Indication: chest pain/ACS  Allergies  Allergen Reactions  . Sulfa Antibiotics Other (See Comments)    unknown    Patient Measurements: Height: 5\' 1"  (154.9 cm) Weight: 160 lb 15 oz (73 kg) IBW/kg (Calculated) : 47.8 Heparin Dosing Weight: ~72kg  Vital Signs: Temp: 98.6 F (37 C) (04/03 2328) Temp src: Oral (04/03 2328) BP: 156/82 mmHg (04/03 2328) Pulse Rate: 87 (04/03 2328)  Labs:  Recent Labs  02/07/13 0424 02/07/13 1222 02/07/13 1959 02/07/13 2337  HGB 12.6  --   --   --   HCT 37.5  --   --   --   PLT 213  --   --   --   HEPARINUNFRC  --   --   --  0.65  CREATININE 1.13*  --   --   --   TROPONINI <0.30 0.44* 0.49*  --     Estimated Creatinine Clearance: 33.9 ml/min (by C-G formula based on Cr of 1.13).   Medical History: Past Medical History  Diagnosis Date  . DVT (deep venous thrombosis)   . Anemia   . Back injury   . Acid reflux   . Hypertension   . Hypercholesterolemia   . Anginal pain   . Asthma    Assessment: 86yof previously on lovenox for DVT prophylaxis (last dose today @ [phone]) now to switch to IV heparin for positive troponin. Heparin level (0.65) is at-goal on 900 units/hr.   Goal of Therapy:  Heparin level 0.3-0.7 units/ml Monitor platelets by anticoagulation protocol: Yes   Plan:  1. Continue IV heparin at 900 units/hr.  2. Daily CBC, heparin level    Emeline Gins 02/08/2013,12:37 AM  Addendum: Heparin level (0.74) is now above-goal. No bleeding per RN. Will decrease IV heparin to 800 units/hr. Heparin level in 8 hours.   Lorre Munroe, PharmD 02/08/13, 05:00 AM

## 2013-02-08 NOTE — Consult Note (Signed)
ANTICOAGULATION CONSULT NOTE - Follow Up Consult  Pharmacy Consult for Heparin Indication: chest pain/ACS  Allergies  Allergen Reactions  . Sulfa Antibiotics Other (See Comments)    unknown    Patient Measurements: Height: 5\' 1"  (154.9 cm) Weight: 158 lb 8.2 oz (71.9 kg) IBW/kg (Calculated) : 47.8 Heparin Dosing Weight: 63 kg  Vital Signs: Temp: 98.5 F (36.9 C) (04/04 1100) Temp src: Oral (04/04 1100) BP: 127/57 mmHg (04/04 1100) Pulse Rate: 82 (04/04 1100)  Labs:  Recent Labs  02/07/13 0424 02/07/13 1222 02/07/13 1959 02/07/13 2337 02/08/13 0402 02/08/13 0408 02/08/13 1305  HGB 12.6  --   --   --  11.6*  --   --   HCT 37.5  --   --   --  33.5*  --   --   PLT 213  --   --   --  200  --   --   HEPARINUNFRC  --   --   --  0.65 0.74*  --  0.65  CREATININE 1.13*  --   --   --  1.13*  --   --   CKTOTAL  --   --   --   --   --  93  --   CKMB  --   --   --   --   --  5.5*  --   TROPONINI <0.30 0.44* 0.49*  --   --   --   --     Estimated Creatinine Clearance: 33.6 ml/min (by C-G formula based on Cr of 1.13).   Medical History: Past Medical History  Diagnosis Date  . DVT (deep venous thrombosis)   . Anemia   . Back injury   . Acid reflux   . Hypertension   . Hypercholesterolemia   . Anginal pain   . Asthma    Assessment: 79 yo F continues on heparin for medical management of CP/ACS.  Troponins were slightly elevated likely for HF exacerbation.  Cardiology has decided to defer cardiac cath and proceed with medical management.  Plan to continue heparin until tomorrow AM.  Heparin level currently therapeutic on 800 units/hr.  Goal of Therapy:  Heparin level 0.3-0.7 units/ml Monitor platelets by anticoagulation protocol: Yes   Plan:  1. Continue IV heparin at 800 units/hr.  2. Daily CBC, heparin level    [employer], Pharm.D., BCPS Clinical Pharmacist Pager [phone] 02/08/2013 3:41 PM

## 2013-02-09 DIAGNOSIS — R0609 Other forms of dyspnea: Secondary | ICD-10-CM

## 2013-02-09 LAB — BASIC METABOLIC PANEL
Chloride: 94 mEq/L — ABNORMAL LOW (ref 96–112)
GFR calc Af Amer: 54 mL/min — ABNORMAL LOW (ref >90)
GFR calc non Af Amer: 46 mL/min — ABNORMAL LOW (ref >90)
Potassium: 4.4 mEq/L (ref 3.5–5.1)
Sodium: 130 mEq/L — ABNORMAL LOW (ref 135–145)

## 2013-02-09 LAB — CBC
MCHC: 33.5 g/dL (ref 30.0–36.0)
Platelets: 208 10*3/uL (ref 150–400)
RDW: 14.4 % (ref 11.5–15.5)
WBC: 8 10*3/uL (ref 4.0–10.5)

## 2013-02-09 LAB — HEPARIN LEVEL (UNFRACTIONATED): Heparin Unfractionated: 0.45 IU/mL (ref 0.30–0.70)

## 2013-02-09 MED ORDER — PREDNISONE 20 MG PO TABS
40.0000 mg | ORAL_TABLET | Freq: Every day | ORAL | Status: DC
  Administered 2013-02-10 – 2013-02-11 (×2): 40 mg via ORAL
  Filled 2013-02-09 (×4): qty 2

## 2013-02-09 MED ORDER — IPRATROPIUM BROMIDE 0.02 % IN SOLN
0.5000 mg | Freq: Two times a day (BID) | RESPIRATORY_TRACT | Status: DC
  Administered 2013-02-10 – 2013-02-11 (×3): 0.5 mg via RESPIRATORY_TRACT
  Filled 2013-02-09 (×4): qty 2.5

## 2013-02-09 MED ORDER — ALBUTEROL SULFATE (5 MG/ML) 0.5% IN NEBU
2.5000 mg | INHALATION_SOLUTION | Freq: Two times a day (BID) | RESPIRATORY_TRACT | Status: DC
  Administered 2013-02-10 – 2013-02-11 (×3): 2.5 mg via RESPIRATORY_TRACT
  Filled 2013-02-09 (×4): qty 0.5

## 2013-02-09 NOTE — Progress Notes (Signed)
Subjective:  Feeling better.  SOB improved.  No chest pain.  Diuresing well  Objective:  Vital Signs in the last 24 hours: BP 106/79  Pulse 81  Temp(Src) 98.5 F (36.9 C) (Oral)  Resp 21  Ht 5\' 1"  (1.549 m)  Wt 70.4 kg (155 lb 3.3 oz)  BMI 29.34 kg/m2  SpO2 97%  Physical Exam: Pleasant WF in NAD sitting up eating breakfast Lungs: rales both bases Cardiac:  Regular rhythm, normal S1 and S2, no S3 Abdomen:  Soft, nontender, no masses Extremities: 1+ edema present  Intake/Output from previous day: 04/04 0701 - 04/05 0700 In: 1084 [P.O.:850; I.V.:184; IV Piggyback:50] Out: 3250 [Urine:3250]  Weight Filed Weights   02/07/13 0702 02/08/13 0345 02/09/13 0500  Weight: 73 kg (160 lb 15 oz) 71.9 kg (158 lb 8.2 oz) 70.4 kg (155 lb 3.3 oz)    Lab Results: Basic Metabolic Panel:  Recent Labs  16/10/96 0402 02/09/13 0445  NA 129* 130*  K 4.1 4.4  CL 93* 94*  CO2 24 26  GLUCOSE 143* 104*  BUN 23 32*  CREATININE 1.13* 1.07   CBC:  Recent Labs  02/08/13 0402 02/09/13 0445  WBC 8.5 8.0  HGB 11.6* 11.9*  HCT 33.5* 35.5*  MCV 82.7 82.9  PLT 200 208   Cardiac Enzymes:  Recent Labs  02/07/13 0424 02/07/13 1222 02/07/13 1959 02/08/13 0408  CKTOTAL  --   --   --  93  CKMB  --   --   --  5.5*  TROPONINI <0.30 0.44* 0.49*  --     Telemetry: Currently sinus rhythm  Assessment/Plan: 1. Acute systolic CHF improving 2. CAD with prior ant MI 3. Hyponatremia mild  Rec:  Clinically better, but needs additional diuresis.  D/c heparin and move to floor. No cath at present as troponin elevation is low.   Darden Palmer  MD [facility]  02/09/2013, 8:07 AM

## 2013-02-09 NOTE — Progress Notes (Signed)
[facility] PROGRESS NOTE  [name] ZOX:[id number] DOB: [birth year] DOA: 02/07/2013 PCP: [name], MD  Assessment/Plan: 1.  Acute respiratory failure most likely from a combination of COPD/asthma versus CHF -  nebulizer Pulmicort and steroids and empiric antibiotics for possible COPD/asthma. Change to PO lasix as negative balance- continue diuresis.  2-D echo done. CE with slight increase and cardiology consulted- appreciate input- no cath currently  2. History of CAD - denies any chest pain.   3.  Peripheral vascular disease status post femoropopliteal bypass - continue antiplatelet agents.   4.  Epigastric discomfort - patient's daughter states it's been chronic. Placed on Protonix.   5.  Hypertension - continue home medications.   6.  Hyperlipidemia -continue home medications  PT eval- hope for d/c soon Ambulate and check O2 sats  Code Status: full Family Communication:  Disposition Plan: transfer to tele bed   Consultants:  cardiology  Procedures:  echo  Antibiotics:    HPI/Subjective: Feeling better  Objective: Filed Vitals:   02/09/13 0126 02/09/13 0300 02/09/13 0400 02/09/13 0500  BP:   106/79   Pulse:      Temp:   98.5 F (36.9 C)   TempSrc:   Oral   Resp:  19 21   Height:      Weight:    70.4 kg (155 lb 3.3 oz)  SpO2: 95% 94% 97%     Intake/Output Summary (Last 24 hours) at 02/09/13 0817 Last data filed at 02/09/13 0700  Gross per 24 hour  Intake   1076 ml  Output   3050 ml  Net  -1974 ml   Filed Weights   02/07/13 0702 02/08/13 0345 02/09/13 0500  Weight: 73 kg (160 lb 15 oz) 71.9 kg (158 lb 8.2 oz) 70.4 kg (155 lb 3.3 oz)    Exam:   General:  A+Ox3, NAD  Cardiovascular: reg  Respiratory: clear  Abdomen: +Bs, soft, NT  Musculoskeletal: moves all 4 ext   Data Reviewed: Basic Metabolic Panel:  Recent Labs Lab 02/07/13 0424 02/08/13 0402 02/09/13 0445  NA 133* 129* 130*  K 4.3 4.1 4.4  CL 97 93* 94*   CO2 24 24 26   GLUCOSE 201* 143* 104*  BUN 18 23 32*  CREATININE 1.13* 1.13* 1.07  CALCIUM 10.3 10.1 10.5   Liver Function Tests:  Recent Labs Lab 02/07/13 0424  AST 22  ALT 13  ALKPHOS 75  BILITOT 0.3  PROT 6.9  ALBUMIN 3.9   No results found for this basename: LIPASE, AMYLASE,  in the last 168 hours No results found for this basename: AMMONIA,  in the last 168 hours CBC:  Recent Labs Lab 02/07/13 0424 02/08/13 0402 02/09/13 0445  WBC 10.2 8.5 8.0  HGB 12.6 11.6* 11.9*  HCT 37.5 33.5* 35.5*  MCV 85.6 82.7 82.9  PLT 213 200 208   Cardiac Enzymes:  Recent Labs Lab 02/07/13 0424 02/07/13 1222 02/07/13 1959 02/08/13 0408  CKTOTAL  --   --   --  93  CKMB  --   --   --  5.5*  TROPONINI <0.30 0.44* 0.49*  --    BNP (last 3 results)  Recent Labs  02/07/13 0424  PROBNP 803.5*   CBG: No results found for this basename: GLUCAP,  in the last 168 hours  No results found for this or any previous visit (from the past 240 hour(s)).   Studies: No results found.  Scheduled Meds: . albuterol  2.5 mg Nebulization Q6H  .  aspirin EC  325 mg Oral Daily  . budesonide (PULMICORT) nebulizer solution  0.25 mg Nebulization BID  . chlorthalidone  25 mg Oral Daily  . clopidogrel  75 mg Oral Q breakfast  . famotidine  20 mg Oral BID  . ferrous sulfate  325 mg Oral Q breakfast  . furosemide  40 mg Oral Daily  . ipratropium  0.5 mg Nebulization Q6H  . isosorbide mononitrate  30 mg Oral Daily  . levofloxacin (LEVAQUIN) IV  250 mg Intravenous Q24H  . methylPREDNISolone (SOLU-MEDROL) injection  40 mg Intravenous Q24H  . metoprolol tartrate  25 mg Oral BID  . pantoprazole  40 mg Oral Daily  . potassium chloride  20 mEq Oral BID  . simvastatin  10 mg Oral q1800  . sodium chloride  3 mL Intravenous Q12H  . sodium chloride  3 mL Intravenous Q12H   Continuous Infusions:    Principal Problem:   Acute respiratory failure Active Problems:   Peripheral arterial disease    Coronary artery disease   Asthma   CHF (congestive heart failure)   Acute systolic heart failure    Time spent: 35    [facility], [name]  [facility] Pager [phone]. If 7PM-7AM, please contact night-coverage at [email or web address], password [facility] 02/09/2013, 8:17 AM  LOS: 2 days

## 2013-02-09 NOTE — Progress Notes (Signed)
SATURATION QUALIFICATIONS: (This note is used to comply with regulatory documentation for home oxygen)  Patient Saturations on Room Air at Rest = 91%  Patient Saturations on Room Air while Ambulating = 85%  Patient Saturations on 2 Liters of oxygen while Ambulating = 88% Patient Saturations on 3 Liters of oxygen while Ambulating = 93%  Please briefly explain why patient needs home oxygen:  Pt desats with activity while on room air. Pt also desat today on 2LO2 with activity. Pt sats stayed >93% on 3LO2 with activity.    Thanks. [facility] [phone] (pager)

## 2013-02-09 NOTE — Evaluation (Signed)
Physical Therapy Evaluation Patient Details Name: [name] MRN: [id number] DOB: 1926/07/22 Today's Date: 02/09/2013 Time: 1444-1500 PT Time Calculation (min): 16 min  PT Assessment / Plan / Recommendation Clinical Impression  Pt s/p respiratory failure with decr mobility secondary to decr endurance and decr balance.  will benefit from PT to address endurance and balance issues.      PT Assessment  Patient needs continued PT services    Follow Up Recommendations  Home health PT;Supervision/Assistance - 24 hour                Equipment Recommendations  None recommended by PT         Frequency Min 3X/week    Precautions / Restrictions Precautions Precautions: Fall Restrictions Weight Bearing Restrictions: No   Pertinent Vitals/Pain See pulmonary note; no pain      Mobility  Bed Mobility Bed Mobility: Rolling Right;Right Sidelying to Sit;Sitting - Scoot to Edge of Bed Rolling Right: 7: Independent Right Sidelying to Sit: 7: Independent Sitting - Scoot to Edge of Bed: 7: Independent Details for Bed Mobility Assistance: Had to sit and rest once pt got to EOB secondary to DOE 2/4.   Transfers Transfers: Sit to Stand;Stand to Sit Sit to Stand: 5: Supervision;With upper extremity assist;From bed Stand to Sit: 5: Supervision;With upper extremity assist;To chair/3-in-1;With armrests Details for Transfer Assistance: Cues needed for hand placement Ambulation/Gait Ambulation/Gait Assistance: 4: Min guard Ambulation Distance (Feet): 200 Feet Assistive device: None Ambulation/Gait Assistance Details: Pt overall with steady gait with occasional staggering of which pt self-corrected.  do not feel pt could accept challenges to balance.  O2 sats decr - see pulm note.  DOE 3/4 at end of walk. Gait Pattern: Step-through pattern;Decreased stride length;Decreased step length - right;Decreased step length - left;Narrow base of support Gait velocity: decreased Stairs:  No Wheelchair Mobility Wheelchair Mobility: No         PT Diagnosis: Generalized weakness  PT Problem List: Decreased activity tolerance;Decreased balance;Decreased mobility;Decreased knowledge of use of DME;Decreased safety awareness;Decreased knowledge of precautions PT Treatment Interventions: DME instruction;Gait training;Functional mobility training;Therapeutic activities;Therapeutic exercise;Balance training;Patient/family education;Other (comment) (home O2)   PT Goals Acute Rehab PT Goals PT Goal Formulation: With patient Time For Goal Achievement: 02/16/13 Potential to Achieve Goals: Good Pt will go Supine/Side to Sit: Independently PT Goal: Supine/Side to Sit - Progress: Goal set today Pt will go Sit to Stand: Independently PT Goal: Sit to Stand - Progress: Goal set today Pt will Ambulate: 51 - 150 feet;with modified independence;with least restrictive assistive device PT Goal: Ambulate - Progress: Goal set today Additional Goals Additional Goal #1: Pt to perform > 18/24 on DGI. PT Goal: Additional Goal #1 - Progress: Goal set today  Visit Information  Last PT Received On: 02/09/13 Assistance Needed: +1    Subjective Data  Subjective: "I usually do ok." Patient Stated Goal: to go home   Prior Functioning  Home Living Lives With: Spouse Available Help at Discharge: Family;Available PRN/intermittently Type of Home: House Home Access: Level entry Home Layout: One level Bathroom Shower/Tub: Health visitor: Standard Home Adaptive Equipment: Environmental consultant - four wheeled;Other (comment);Built-in shower seat;Grab bars in shower (home O2 needed at night PTA) Additional Comments: uses 4 wheeled walker when shopping Prior Function Level of Independence: Independent Able to Take Stairs?: Yes Driving: Yes Vocation: Retired Musician: No difficulties    Copywriter, advertising Overall Cognitive Status: Appears within functional limits for tasks  assessed/performed Arousal/Alertness: Awake/alert Orientation Level: Appears intact for  tasks assessed Behavior During Session: [facility] for tasks performed    Extremity/Trunk Assessment Right Lower Extremity Assessment RLE ROM/Strength/Tone: [facility] for tasks assessed Left Lower Extremity Assessment LLE ROM/Strength/Tone: [facility] for tasks assessed Trunk Assessment Trunk Assessment: Normal   Balance High Level Balance High Level Balance Activites: Side stepping;Direction changes;Turns;Sudden stops High Level Balance Comments: Pt needed steadying assist with challenges to balance.   End of Session PT - End of Session Equipment Utilized During Treatment: Gait belt;Oxygen Activity Tolerance: Patient limited by fatigue Patient left: in chair;with call bell/phone within reach;with family/visitor present Nurse Communication: Mobility status       INGOLD,Lerae Langham 02/09/2013, 4:47 PM [facility] [phone] (pager)

## 2013-02-10 DIAGNOSIS — I739 Peripheral vascular disease, unspecified: Secondary | ICD-10-CM

## 2013-02-10 LAB — BASIC METABOLIC PANEL
BUN: 39 mg/dL — ABNORMAL HIGH (ref 6–23)
Chloride: 90 mEq/L — ABNORMAL LOW (ref 96–112)
GFR calc Af Amer: 51 mL/min — ABNORMAL LOW (ref >90)
GFR calc non Af Amer: 44 mL/min — ABNORMAL LOW (ref >90)
Glucose, Bld: 114 mg/dL — ABNORMAL HIGH (ref 70–99)
Potassium: 4.3 mEq/L (ref 3.5–5.1)
Sodium: 128 mEq/L — ABNORMAL LOW (ref 135–145)

## 2013-02-10 LAB — CBC
HCT: 37.4 % (ref 36.0–46.0)
Hemoglobin: 12.9 g/dL (ref 12.0–15.0)
MCHC: 34.5 g/dL (ref 30.0–36.0)
WBC: 8 10*3/uL (ref 4.0–10.5)

## 2013-02-10 MED ORDER — FUROSEMIDE 20 MG PO TABS
20.0000 mg | ORAL_TABLET | Freq: Every day | ORAL | Status: DC
  Administered 2013-02-10: 20 mg via ORAL
  Filled 2013-02-10 (×2): qty 1

## 2013-02-10 NOTE — Progress Notes (Signed)
Subjective:  Feeling better.  SOB improved.  No chest pain. Still desat with exercise  Objective:  Vital Signs in the last 24 hours: BP 113/63  Pulse 71  Temp(Src) 98.3 F (36.8 C) (Oral)  Resp 18  Ht 5\' 2"  (1.575 m)  Wt 68.584 kg (151 lb 3.2 oz)  BMI 27.65 kg/m2  SpO2 98%  Physical Exam: Pleasant WF in NAD sitting up eating breakfast Lungs: rales both bases Cardiac:  Regular rhythm, normal S1 and S2, no S3 Abdomen:  Soft, nontender, no masses Extremities: 1+ edema present  Intake/Output from previous day: 04/05 0701 - 04/06 0700 In: 780 [P.O.:780] Out: 1250 [Urine:1250]  Weight Filed Weights   02/09/13 0500 02/09/13 1005 02/10/13 0534  Weight: 70.4 kg (155 lb 3.3 oz) 68.4 kg (150 lb 12.7 oz) 68.584 kg (151 lb 3.2 oz)    Lab Results: Basic Metabolic Panel:  Recent Labs  16/10/96 0445 02/10/13 0531  NA 130* 128*  K 4.4 4.3  CL 94* 90*  CO2 26 25  GLUCOSE 104* 114*  BUN 32* 39*  CREATININE 1.07 1.12*   CBC:  Recent Labs  02/09/13 0445 02/10/13 0531  WBC 8.0 8.0  HGB 11.9* 12.9  HCT 35.5* 37.4  MCV 82.9 81.7  PLT 208 255   Cardiac Enzymes:  Recent Labs  02/07/13 1222 02/07/13 1959 02/08/13 0408  CKTOTAL  --   --  93  CKMB  --   --  5.5*  TROPONINI 0.44* 0.49*  --     Telemetry: Currently sinus rhythm  Assessment/Plan: 1. Acute systolic CHF improving 2. CAD with prior ant MI 3. Hyponatremia mild  Rec:  Clinically better, but needs additional diuresis.   Darden Palmer  MD [facility] Card[facility]  02/10/2013, 9:39 AM

## 2013-02-10 NOTE — Progress Notes (Signed)
[facility] PROGRESS NOTE  [name] QMV:[id number] DOB: 1926-01-27 DOA: 02/07/2013 PCP: [name], MD  Assessment/Plan: 1.  Acute respiratory failure most likely from a combination of COPD/asthma versus CHF -  nebulizer Pulmicort and steroids and empiric antibiotics for possible COPD/asthma. Change to PO lasix as negative balance- continue diuresis.  2-D echo done. CE with slight increase and cardiology consulted- appreciate input- no cath currently  2. History of CAD - denies any chest pain.   3.  Peripheral vascular disease status post femoropopliteal bypass - continue antiplatelet agents.   4.  Epigastric discomfort - patient's daughter states it's been chronic. Placed on Protonix.   5.  Hypertension - continue home medications.   6.  Hyperlipidemia -continue home medications  PT eval- hope for d/c soon Ambulate and check O2 sats  Code Status: full Family Communication:  Disposition Plan: home tomm   Consultants:  cardiology  Procedures:  echo  Antibiotics:    HPI/Subjective: Feeling better- giving herself a bath  Objective: Filed Vitals:   02/09/13 2042 02/10/13 0205 02/10/13 0534 02/10/13 0952  BP: 97/68 113/63    Pulse: 79 63 71   Temp: 98 F (36.7 C)  98.3 F (36.8 C)   TempSrc: Oral  Oral   Resp: 18 18 18    Height:      Weight:   68.584 kg (151 lb 3.2 oz)   SpO2: 98% 97% 98% 95%    Intake/Output Summary (Last 24 hours) at 02/10/13 1235 Last data filed at 02/10/13 0958  Gross per 24 hour  Intake   1140 ml  Output   1250 ml  Net   -110 ml   Filed Weights   02/09/13 0500 02/09/13 1005 02/10/13 0534  Weight: 70.4 kg (155 lb 3.3 oz) 68.4 kg (150 lb 12.7 oz) 68.584 kg (151 lb 3.2 oz)    Exam:   General:  A+Ox3, NAD  Cardiovascular: reg  Respiratory: clear  Abdomen: +Bs, soft, NT  Musculoskeletal: moves all 4 ext   Data Reviewed: Basic Metabolic Panel:  Recent Labs Lab 02/07/13 0424 02/08/13 0402  02/09/13 0445 02/10/13 0531  NA 133* 129* 130* 128*  K 4.3 4.1 4.4 4.3  CL 97 93* 94* 90*  CO2 24 24 26 25   GLUCOSE 201* 143* 104* 114*  BUN 18 23 32* 39*  CREATININE 1.13* 1.13* 1.07 1.12*  CALCIUM 10.3 10.1 10.5 11.1*   Liver Function Tests:  Recent Labs Lab 02/07/13 0424  AST 22  ALT 13  ALKPHOS 75  BILITOT 0.3  PROT 6.9  ALBUMIN 3.9   No results found for this basename: LIPASE, AMYLASE,  in the last 168 hours No results found for this basename: AMMONIA,  in the last 168 hours CBC:  Recent Labs Lab 02/07/13 0424 02/08/13 0402 02/09/13 0445 02/10/13 0531  WBC 10.2 8.5 8.0 8.0  HGB 12.6 11.6* 11.9* 12.9  HCT 37.5 33.5* 35.5* 37.4  MCV 85.6 82.7 82.9 81.7  PLT 213 200 208 255   Cardiac Enzymes:  Recent Labs Lab 02/07/13 0424 02/07/13 1222 02/07/13 1959 02/08/13 0408  CKTOTAL  --   --   --  93  CKMB  --   --   --  5.5*  TROPONINI <0.30 0.44* 0.49*  --    BNP (last 3 results)  Recent Labs  02/07/13 0424  PROBNP 803.5*   CBG: No results found for this basename: GLUCAP,  in the last 168 hours  No results found for this or any  previous visit (from the past 240 hour(s)).   Studies: No results found.  Scheduled Meds: . albuterol  2.5 mg Nebulization BID  . aspirin EC  325 mg Oral Daily  . budesonide (PULMICORT) nebulizer solution  0.25 mg Nebulization BID  . chlorthalidone  25 mg Oral Daily  . clopidogrel  75 mg Oral Q breakfast  . famotidine  20 mg Oral BID  . ferrous sulfate  325 mg Oral Q breakfast  . furosemide  20 mg Oral Daily  . ipratropium  0.5 mg Nebulization BID  . isosorbide mononitrate  30 mg Oral Daily  . levofloxacin (LEVAQUIN) IV  250 mg Intravenous Q24H  . metoprolol tartrate  25 mg Oral BID  . pantoprazole  40 mg Oral Daily  . potassium chloride  20 mEq Oral BID  . predniSONE  40 mg Oral Q breakfast  . simvastatin  10 mg Oral q1800  . sodium chloride  3 mL Intravenous Q12H  . sodium chloride  3 mL Intravenous Q12H    Continuous Infusions:    Principal Problem:   Acute respiratory failure Active Problems:   Peripheral arterial disease   Coronary artery disease   Asthma   CHF (congestive heart failure)   Acute systolic heart failure    Time spent: 35    [facility], [name]  [facility] Pager [phone]. If 7PM-7AM, please contact night-coverage at [email or web address], password [facility] 02/10/2013, 12:35 PM  LOS: 3 days

## 2013-02-10 NOTE — Progress Notes (Signed)
ANTIBIOTIC CONSULT NOTE - FOLLOW UP  Pharmacy Consult for Levaquin Indication: AECOPD  Allergies  Allergen Reactions  . Sulfa Antibiotics Other (See Comments)    unknown    Patient Measurements: Height: 5\' 2"  (157.5 cm) (67f2i) Weight: 151 lb 3.2 oz (68.584 kg) (scale b) IBW/kg (Calculated) : 50.1  Vital Signs: Temp: 98.3 F (36.8 C) (04/06 0534) Temp src: Oral (04/06 0534) BP: 113/63 mmHg (04/06 0205) Pulse Rate: 71 (04/06 0534) Intake/Output from previous day: 04/05 0701 - 04/06 0700 In: 780 [P.O.:780] Out: 1250 [Urine:1250] Intake/Output from this shift:    Labs:  Recent Labs  02/08/13 0402 02/09/13 0445 02/10/13 0531  WBC 8.5 8.0 8.0  HGB 11.6* 11.9* 12.9  PLT 200 208 255  CREATININE 1.13* 1.07 1.12*   Estimated Creatinine Clearance: 33.9 ml/min (by C-G formula based on Cr of 1.12). No results found for this basename: VANCOTROUGH, VANCOPEAK, VANCORANDOM, GENTTROUGH, GENTPEAK, GENTRANDOM, TOBRATROUGH, TOBRAPEAK, TOBRARND, AMIKACINPEAK, AMIKACINTROU, AMIKACIN,  in the last 72 hours   Microbiology: No results found for this or any previous visit (from the past 720 hour(s)).  Anti-infectives   Start     Dose/Rate Route Frequency Ordered Stop   02/08/13 0800  Levofloxacin (LEVAQUIN) IVPB 250 mg     250 mg 50 mL/hr over 60 Minutes Intravenous Every 24 hours 02/07/13 0715     02/07/13 0800  levofloxacin (LEVAQUIN) IVPB 500 mg     500 mg 100 mL/hr over 60 Minutes Intravenous  Once 02/07/13 0715 02/07/13 1032      Assessment: 79 y/o F on admitted with SOB and started on antibiotics ans steroids for possible copd/ashtma exacerbation. Today is day 4 of therapy with levaquin. WBC WNL and afebrile. CrCl ~ 58ml/min. IV steroids have been change to po prednisone.   Goal of Therapy:  Eradication of infection  Plan:  - Continue levaquin 250 mg IV q24h (switch to PO soon) - Trend WBC, temp, clinical status - F/U length of treatment with antibiotics  Abran Duke, PharmD Clinical Pharmacist Phone: [phone] Pager: [phone] 02/10/2013 9:40 AM

## 2013-02-11 ENCOUNTER — Encounter (HOSPITAL_COMMUNITY): Payer: Self-pay | Admitting: Interventional Cardiology

## 2013-02-11 DIAGNOSIS — E871 Hypo-osmolality and hyponatremia: Secondary | ICD-10-CM | POA: Insufficient documentation

## 2013-02-11 LAB — BASIC METABOLIC PANEL
BUN: 50 mg/dL — ABNORMAL HIGH (ref 6–23)
CO2: 27 mEq/L (ref 19–32)
Glucose, Bld: 93 mg/dL (ref 70–99)
Potassium: 4 mEq/L (ref 3.5–5.1)
Sodium: 129 mEq/L — ABNORMAL LOW (ref 135–145)

## 2013-02-11 LAB — CBC
HCT: 37.1 % (ref 36.0–46.0)
Hemoglobin: 12.9 g/dL (ref 12.0–15.0)
MCH: 28 pg (ref 26.0–34.0)
MCHC: 34.8 g/dL (ref 30.0–36.0)
RBC: 4.6 MIL/uL (ref 3.87–5.11)

## 2013-02-11 MED ORDER — LEVOFLOXACIN 250 MG PO TABS
250.0000 mg | ORAL_TABLET | Freq: Every day | ORAL | Status: DC
  Administered 2013-02-11: 250 mg via ORAL
  Filled 2013-02-11: qty 1

## 2013-02-11 MED ORDER — FUROSEMIDE 40 MG PO TABS
40.0000 mg | ORAL_TABLET | Freq: Every day | ORAL | Status: DC
  Administered 2013-02-11: 40 mg via ORAL
  Filled 2013-02-11: qty 1

## 2013-02-11 MED ORDER — PREDNISONE 20 MG PO TABS: ORAL_TABLET | ORAL | Status: DC

## 2013-02-11 MED ORDER — FUROSEMIDE 40 MG PO TABS: 20.0000 mg | ORAL_TABLET | Freq: Every day | ORAL | Status: DC

## 2013-02-11 NOTE — Progress Notes (Signed)
1330 discharge instructions  And prescriptions given to pt and spouse. Both verbalized understanding. Wheeled to lobby by Psychiatrist

## 2013-02-11 NOTE — Discharge Summary (Signed)
Physician Discharge Summary  [name]:[id number] DOB: 1926/04/14 DOA: 02/07/2013  PCP: [name], MD  Admit date: 02/07/2013 Discharge date: 02/12/2013  Time spent: 35 minutes  Recommendations for Outpatient Follow-up:  1. Weight self daily 2. BMP 1 week  Discharge Diagnoses:  Principal Problem:   Acute respiratory failure Active Problems:   Peripheral arterial disease   Coronary artery disease   Asthma   CHF (congestive heart failure)   Acute systolic heart failure   Discharge Condition: improved  Diet recommendation: cardiac  Filed Weights   02/09/13 1005 02/10/13 0534 02/11/13 0543  Weight: 68.4 kg (150 lb 12.7 oz) 68.584 kg (151 lb 3.2 oz) 68.9 kg (151 lb 14.4 oz)    History of present illness:  [name] is a 79 y.o. female with history of CAD, hypertension, hyperlipidemia, COPD/asthma presented to the ER because of sudden onset of shortness of breath today morning 2 AM which woke her up from sleep. EMS on arrival found patient to be short of breath and wheezing and had initially placed patient on nebulizer and IV steroids and was brought to the ER. Patient was placed on BiPAP. Chest x-ray shows congestion and cardiomegaly. At this time one dose of IV Lasix 40 mg has been ordered by the ER physician and patient has been admitted for further management. Patient denies any chest pain but she does have chronic epigastric discomfort. Denies any nausea vomiting diarrhea. Since admission patient states her shortness of breath has improved but still mildly short of breath. Presently on BiPAP and plans to wean her off. EKG is unremarkable cardiac enzymes have been negative and BNP is elevated.   Hospital Course:  Acute respiratory failure most likely from a combination of COPD/asthma versus CHF - nebulizer Pulmicort and steroids and empiric antibiotics for possible COPD/asthma. Change to PO lasix as negative balance- continue diuresis. 2-D echo done. CE with  slight increase and cardiology consulted- appreciate input- no cath currently  2. History of CAD - denies any chest pain.  3. Peripheral vascular disease status post femoropopliteal bypass - continue antiplatelet agents.  4. Epigastric discomfort - patient's daughter states it's been chronic. Placed on Protonix.  5. Hypertension - continue home medications.  6. Hyperlipidemia -continue home medications   Procedures:  none  Consultations:  none  Discharge Exam: Filed Vitals:   02/10/13 1434 02/10/13 2157 02/11/13 0543 02/11/13 1017  BP: 126/76 141/70 115/70 118/64  Pulse: 71 79 66 60  Temp: 97.9 F (36.6 C) 98.4 F (36.9 C) 98.4 F (36.9 C)   TempSrc: Oral Oral Oral   Resp: 18 20 22    Height:      Weight:   68.9 kg (151 lb 14.4 oz)   SpO2: 96% 97% 97% 99%    General: A+Ox3, NAD- feeling better than she has in 6 months Cardiovascular: rrr Respiratory: clear anterior  Discharge Instructions      Discharge Orders   Future Appointments Provider Department Dept Phone   05/13/2013 3:45 PM Waymon Budge, MD [facility] [phone]   Future Orders Complete By Expires     (HEART FAILURE PATIENTS) Call MD:  Anytime you have any of the following symptoms: 1) 3 pound weight gain in 24 hours or 5 pounds in 1 week 2) shortness of breath, with or without a dry hacking cough 3) swelling in the hands, feet or stomach 4) if you have to sleep on extra pillows at night in order to breathe.  As directed  Diet - low sodium heart healthy  As directed     Discharge instructions  As directed     Comments:      Home health PT, RN BMP with in 1 week re Cr/K Weigh self daily- if weight increases can take an extra lasix (20 mg)    Discharge instructions  As directed     Comments:      O2 2L 24/7    Increase activity slowly  As directed         Medication List    STOP taking these medications       chlorthalidone 25 MG tablet  Commonly known as:  HYGROTON      TAKE  these medications       albuterol 108 (90 BASE) MCG/ACT inhaler  Commonly known as:  PROVENTIL HFA;VENTOLIN HFA  Inhale 2 puffs into the lungs every 6 (six) hours as needed for wheezing.     aspirin 81 MG tablet  Take 81 mg by mouth daily.     clopidogrel 75 MG tablet  Commonly known as:  PLAVIX  Take 75 mg by mouth daily.     diazepam 5 MG tablet  Commonly known as:  VALIUM  Take 5 mg by mouth daily as needed.     ferrous sulfate 325 (65 FE) MG tablet  Take 325 mg by mouth daily with breakfast.     Fluticasone-Salmeterol 100-50 MCG/DOSE Aepb  Commonly known as:  ADVAIR DISKUS  1 puff then rise mouth, two times every day     furosemide 40 MG tablet  Commonly known as:  LASIX  Take 0.5 tablets (20 mg total) by mouth daily.     isosorbide mononitrate 30 MG 24 hr tablet  Commonly known as:  IMDUR  Take 30 mg by mouth daily.     lovastatin 20 MG tablet  Commonly known as:  MEVACOR  Take 20 mg by mouth every morning.     metoprolol tartrate 25 MG tablet  Commonly known as:  LOPRESSOR  Take 25 mg by mouth 2 (two) times daily.     nitroGLYCERIN 0.4 MG SL tablet  Commonly known as:  NITROSTAT  Place 0.4 mg under the tongue every 5 (five) minutes as needed.     pantoprazole 40 MG tablet  Commonly known as:  PROTONIX  Take 40 mg by mouth daily.     potassium chloride 20 MEQ packet  Commonly known as:  KLOR-CON  Take 20 mEq by mouth 2 (two) times daily.     predniSONE 20 MG tablet  Commonly known as:  DELTASONE  10 mg: 30 mg x 2 days, 20 mg x 2 days, 10 mg x 2 days and then d/c     ranitidine 150 MG tablet  Commonly known as:  ZANTAC  Take 150 mg by mouth 2 (two) times daily.     tiotropium 18 MCG inhalation capsule  Commonly known as:  SPIRIVA  Place 18 mcg into inhaler and inhale daily.     Vitamin D 1000 UNITS capsule  Take 1,000 Units by mouth daily.       Follow-up Information   Follow up with [name], MD In 1 week. (for BMP)    Contact  information:   [address], [place] [address] [phone]  Follow up with Corky Crafts., MD. (as needed)    Contact information:   [address] [place] [address] [phone]        The results of significant diagnostics from this hospitalization (including imaging, microbiology, ancillary and laboratory) are listed below for reference.    Significant Diagnostic Studies: Dg Chest Port 1 View  02/07/2013  *RADIOLOGY REPORT*  Clinical Data: Respiratory distress.  Shortness of breath.  PORTABLE CHEST - 1 VIEW  Comparison: 11/13/2012  Findings: Enlargement with increased pulmonary vascularity.  New interstitial changes suggesting interstitial edema.  No blunting of costophrenic angles.  No pneumothorax.  Calcified and tortuous aorta.  IMPRESSION: Interval development of cardiac enlargement with pulmonary vascular congestion and interstitial edema.   Original Report Authenticated By: Burman Nieves, M.D.     Microbiology: No results found for this or any previous visit (from the past 240 hour(s)).   Labs: Basic Metabolic Panel:  Recent Labs Lab 02/07/13 0424 02/08/13 0402 02/09/13 0445 02/10/13 0531 02/11/13 0630  NA 133* 129* 130* 128* 129*  K 4.3 4.1 4.4 4.3 4.0  CL 97 93* 94* 90* 90*  CO2 24 24 26 25 27   GLUCOSE 201* 143* 104* 114* 93  BUN 18 23 32* 39* 50*  CREATININE 1.13* 1.13* 1.07 1.12* 1.27*  CALCIUM 10.3 10.1 10.5 11.1* 10.8*   Liver Function Tests:  Recent Labs Lab 02/07/13 0424  AST 22  ALT 13  ALKPHOS 75  BILITOT 0.3  PROT 6.9  ALBUMIN 3.9   No results found for this basename: LIPASE, AMYLASE,  in the last 168 hours No results found for this basename: AMMONIA,  in the last 168 hours CBC:  Recent Labs Lab 02/07/13 0424 02/08/13 0402 02/09/13 0445 02/10/13 0531 02/11/13 0630  WBC 10.2 8.5 8.0 8.0 7.4  HGB 12.6 11.6* 11.9* 12.9 12.9  HCT 37.5 33.5*  35.5* 37.4 37.1  MCV 85.6 82.7 82.9 81.7 80.7  PLT 213 200 208 255 227   Cardiac Enzymes:  Recent Labs Lab 02/07/13 0424 02/07/13 1222 02/07/13 1959 02/08/13 0408  CKTOTAL  --   --   --  93  CKMB  --   --   --  5.5*  TROPONINI <0.30 0.44* 0.49*  --    BNP: BNP (last 3 results)  Recent Labs  02/07/13 0424  PROBNP 803.5*   CBG: No results found for this basename: GLUCAP,  in the last 168 hours     Signed:  Benjamine Mola, Jaquari Reckner  [facility] 02/12/2013, 2:30 PM

## 2013-02-11 NOTE — Progress Notes (Addendum)
Subjective:  Feeling better.  SOB improved.  No chest pain.  She does wear oxygen at home at night.  Objective:  Vital Signs in the last 24 hours: BP 115/70  Pulse 66  Temp(Src) 98.4 F (36.9 C) (Oral)  Resp 22  Ht 5\' 2"  (1.575 m)  Wt 68.9 kg (151 lb 14.4 oz)  BMI 27.78 kg/m2  SpO2 97%  Physical Exam: Pleasant WF in NAD  Lungs: rales both bases Cardiac:  Regular rhythm, normal S1 and S2, no S3 Abdomen:  Soft, nontender, no masses Extremities: 1+ lower extremity edema present  Intake/Output from previous day: 04/06 0701 - 04/07 0700 In: 1240 [P.O.:1140; IV Piggyback:100] Out: 2650 [Urine:2650]  Weight Filed Weights   02/09/13 1005 02/10/13 0534 02/11/13 0543  Weight: 68.4 kg (150 lb 12.7 oz) 68.584 kg (151 lb 3.2 oz) 68.9 kg (151 lb 14.4 oz)    Lab Results: Basic Metabolic Panel:  Recent Labs  40/98/11 0531 02/11/13 0630  NA 128* 129*  K 4.3 4.0  CL 90* 90*  CO2 25 27  GLUCOSE 114* 93  BUN 39* 50*  CREATININE 1.12* 1.27*   CBC:  Recent Labs  02/10/13 0531 02/11/13 0630  WBC 8.0 7.4  HGB 12.9 12.9  HCT 37.4 37.1  MCV 81.7 80.7  PLT 255 227   Cardiac Enzymes: No results found for this basename: CKTOTAL, CKMB, CKMBINDEX, TROPONINI,  in the last 72 hours  Telemetry: Currently sinus rhythm  Assessment/Plan: 1. Acute systolic CHF improving 2. CAD with prior ant MI 3. Hyponatremia mild  Rec:  Continue current medical therapy for coronary artery disease and LV dysfunction.  She was receiving 2 different diuretics.  Will hold chlorthalidone given her hyponatremia.  Increase furosemide to 40 mg daily.  Corky Crafts,   MD [facility]  02/11/2013, 9:18 AM

## 2013-02-11 NOTE — Progress Notes (Signed)
SATURATION QUALIFICATIONS: (This note is used to comply with regulatory documentation for home oxygen)  Patient Saturations on Room Air at rest 94%  Patient Saturations on Room Air while Ambulating  92%  Patient Saturations on  0 Liters of oxygen while Ambulating 94%  Please briefly explain why patient needs home oxygen: pt maintain 02 sat at > 92 on rest and ambulation

## 2013-02-14 NOTE — Progress Notes (Signed)
02/14/13 1045 TC to pt. at home [phone]), in reference to home health services [facility] RN and [facility] PT.  Pt. stated she didn't know, and gave permission to this NCM to call her daughter, Quentin Angst at [phone]).  TC to Melissa, and inquired about her mother having home health services, and she stated her mother was doing much better, and has been able to get around well with her dad.  At this time, she did not want home health.  Gave Melissa NCM number to call if she changed her mind, or if her mother needed services at a later date.   Tera Mater, RN, BSN NCM [phone]

## 2013-02-23 ENCOUNTER — Emergency Department (HOSPITAL_COMMUNITY): Payer: Medicare HMO

## 2013-02-23 ENCOUNTER — Encounter (HOSPITAL_COMMUNITY): Payer: Self-pay | Admitting: *Deleted

## 2013-02-23 ENCOUNTER — Inpatient Hospital Stay (HOSPITAL_COMMUNITY)
Admission: EM | Admit: 2013-02-23 | Discharge: 2013-02-28 | DRG: 291 | Disposition: A | Discharge status: Skilled Nursing Facility | Payer: Medicare HMO | Attending: Internal Medicine | Admitting: Internal Medicine

## 2013-02-23 DIAGNOSIS — N390 Urinary tract infection, site not specified: Secondary | ICD-10-CM | POA: Diagnosis present

## 2013-02-23 DIAGNOSIS — Z833 Family history of diabetes mellitus: Secondary | ICD-10-CM

## 2013-02-23 DIAGNOSIS — I2510 Atherosclerotic heart disease of native coronary artery without angina pectoris: Secondary | ICD-10-CM | POA: Diagnosis present

## 2013-02-23 DIAGNOSIS — I739 Peripheral vascular disease, unspecified: Secondary | ICD-10-CM | POA: Diagnosis present

## 2013-02-23 DIAGNOSIS — J9620 Acute and chronic respiratory failure, unspecified whether with hypoxia or hypercapnia: Secondary | ICD-10-CM | POA: Diagnosis present

## 2013-02-23 DIAGNOSIS — E871 Hypo-osmolality and hyponatremia: Secondary | ICD-10-CM | POA: Diagnosis present

## 2013-02-23 DIAGNOSIS — I509 Heart failure, unspecified: Secondary | ICD-10-CM

## 2013-02-23 DIAGNOSIS — A498 Other bacterial infections of unspecified site: Secondary | ICD-10-CM | POA: Diagnosis present

## 2013-02-23 DIAGNOSIS — Z86718 Personal history of other venous thrombosis and embolism: Secondary | ICD-10-CM

## 2013-02-23 DIAGNOSIS — Z8249 Family history of ischemic heart disease and other diseases of the circulatory system: Secondary | ICD-10-CM

## 2013-02-23 DIAGNOSIS — J45909 Unspecified asthma, uncomplicated: Secondary | ICD-10-CM | POA: Diagnosis present

## 2013-02-23 DIAGNOSIS — I5043 Acute on chronic combined systolic (congestive) and diastolic (congestive) heart failure: Principal | ICD-10-CM | POA: Diagnosis present

## 2013-02-23 DIAGNOSIS — R0602 Shortness of breath: Secondary | ICD-10-CM

## 2013-02-23 DIAGNOSIS — E876 Hypokalemia: Secondary | ICD-10-CM | POA: Diagnosis not present

## 2013-02-23 DIAGNOSIS — J9600 Acute respiratory failure, unspecified whether with hypoxia or hypercapnia: Secondary | ICD-10-CM

## 2013-02-23 DIAGNOSIS — J441 Chronic obstructive pulmonary disease with (acute) exacerbation: Secondary | ICD-10-CM | POA: Diagnosis present

## 2013-02-23 DIAGNOSIS — I10 Essential (primary) hypertension: Secondary | ICD-10-CM | POA: Diagnosis present

## 2013-02-23 DIAGNOSIS — E236 Other disorders of pituitary gland: Secondary | ICD-10-CM | POA: Diagnosis present

## 2013-02-23 DIAGNOSIS — I5021 Acute systolic (congestive) heart failure: Secondary | ICD-10-CM

## 2013-02-23 DIAGNOSIS — K219 Gastro-esophageal reflux disease without esophagitis: Secondary | ICD-10-CM | POA: Diagnosis present

## 2013-02-23 DIAGNOSIS — Z7982 Long term (current) use of aspirin: Secondary | ICD-10-CM

## 2013-02-23 DIAGNOSIS — E7800 Pure hypercholesterolemia, unspecified: Secondary | ICD-10-CM | POA: Diagnosis present

## 2013-02-23 DIAGNOSIS — Z79899 Other long term (current) drug therapy: Secondary | ICD-10-CM

## 2013-02-23 DIAGNOSIS — E785 Hyperlipidemia, unspecified: Secondary | ICD-10-CM | POA: Diagnosis present

## 2013-02-23 DIAGNOSIS — Z882 Allergy status to sulfonamides status: Secondary | ICD-10-CM

## 2013-02-23 LAB — CBC WITH DIFFERENTIAL/PLATELET
Basophils Absolute: 0 10*3/uL (ref 0.0–0.1)
Eosinophils Relative: 1 % (ref 0–5)
Lymphocytes Relative: 10 % — ABNORMAL LOW (ref 12–46)
Lymphs Abs: 0.8 10*3/uL (ref 0.7–4.0)
MCV: 81.6 fL (ref 78.0–100.0)
Neutro Abs: 6.9 10*3/uL (ref 1.7–7.7)
Neutrophils Relative %: 84 % — ABNORMAL HIGH (ref 43–77)
Platelets: 184 10*3/uL (ref 150–400)
RBC: 4.29 MIL/uL (ref 3.87–5.11)
RDW: 14.3 % (ref 11.5–15.5)
WBC: 8.2 10*3/uL (ref 4.0–10.5)

## 2013-02-23 LAB — URINALYSIS, ROUTINE W REFLEX MICROSCOPIC
Glucose, UA: NEGATIVE mg/dL
Ketones, ur: NEGATIVE mg/dL
Nitrite: NEGATIVE
Specific Gravity, Urine: 1.015 (ref 1.005–1.030)
pH: 6.5 (ref 5.0–8.0)

## 2013-02-23 LAB — PRO B NATRIURETIC PEPTIDE: Pro B Natriuretic peptide (BNP): 1602 pg/mL — ABNORMAL HIGH (ref 0–450)

## 2013-02-23 LAB — BASIC METABOLIC PANEL
CO2: 27 mEq/L (ref 19–32)
Calcium: 10 mg/dL (ref 8.4–10.5)
Chloride: 86 mEq/L — ABNORMAL LOW (ref 96–112)
Glucose, Bld: 124 mg/dL — ABNORMAL HIGH (ref 70–99)
Potassium: 3.7 mEq/L (ref 3.5–5.1)
Sodium: 123 mEq/L — ABNORMAL LOW (ref 135–145)

## 2013-02-23 LAB — URINE MICROSCOPIC-ADD ON

## 2013-02-23 LAB — POCT I-STAT TROPONIN I: Troponin i, poc: 0.02 ng/mL (ref 0.00–0.08)

## 2013-02-23 MED ORDER — ONDANSETRON HCL 4 MG/2ML IJ SOLN: 4.0000 mg | Freq: Three times a day (TID) | INTRAMUSCULAR | Status: DC | PRN

## 2013-02-23 MED ORDER — FUROSEMIDE 10 MG/ML IJ SOLN
40.0000 mg | Freq: Two times a day (BID) | INTRAMUSCULAR | Status: DC
  Administered 2013-02-24 – 2013-02-25 (×3): 40 mg via INTRAVENOUS
  Filled 2013-02-23 (×4): qty 4

## 2013-02-23 MED ORDER — TIOTROPIUM BROMIDE MONOHYDRATE 18 MCG IN CAPS: 18.0000 ug | ORAL_CAPSULE | Freq: Every day | RESPIRATORY_TRACT | Status: DC

## 2013-02-23 MED ORDER — ALBUTEROL SULFATE HFA 108 (90 BASE) MCG/ACT IN AERS
2.0000 | INHALATION_SPRAY | Freq: Four times a day (QID) | RESPIRATORY_TRACT | Status: DC | PRN
  Filled 2013-02-23: qty 6.7

## 2013-02-23 MED ORDER — POTASSIUM CHLORIDE CRYS ER 20 MEQ PO TBCR
40.0000 meq | EXTENDED_RELEASE_TABLET | Freq: Every day | ORAL | Status: DC
  Administered 2013-02-23 – 2013-02-28 (×6): 40 meq via ORAL
  Filled 2013-02-23 (×6): qty 2

## 2013-02-23 MED ORDER — ISOSORBIDE MONONITRATE ER 30 MG PO TB24
30.0000 mg | ORAL_TABLET | Freq: Every day | ORAL | Status: DC
  Administered 2013-02-24 – 2013-02-28 (×5): 30 mg via ORAL
  Filled 2013-02-23 (×5): qty 1

## 2013-02-23 MED ORDER — MOMETASONE FURO-FORMOTEROL FUM 100-5 MCG/ACT IN AERO
2.0000 | INHALATION_SPRAY | Freq: Two times a day (BID) | RESPIRATORY_TRACT | Status: DC
  Administered 2013-02-23 – 2013-02-28 (×10): 2 via RESPIRATORY_TRACT
  Filled 2013-02-23: qty 8.8

## 2013-02-23 MED ORDER — ONDANSETRON HCL 4 MG/2ML IJ SOLN
4.0000 mg | Freq: Four times a day (QID) | INTRAMUSCULAR | Status: DC | PRN
  Administered 2013-02-28: 4 mg via INTRAVENOUS
  Filled 2013-02-23: qty 2

## 2013-02-23 MED ORDER — SODIUM CHLORIDE 0.9 % IJ SOLN
3.0000 mL | Freq: Two times a day (BID) | INTRAMUSCULAR | Status: DC
  Administered 2013-02-23 – 2013-02-28 (×10): 3 mL via INTRAVENOUS

## 2013-02-23 MED ORDER — SIMVASTATIN 5 MG PO TABS
5.0000 mg | ORAL_TABLET | Freq: Every day | ORAL | Status: DC
  Filled 2013-02-23: qty 1

## 2013-02-23 MED ORDER — METOPROLOL TARTRATE 25 MG PO TABS
25.0000 mg | ORAL_TABLET | Freq: Two times a day (BID) | ORAL | Status: DC
  Administered 2013-02-23 – 2013-02-28 (×10): 25 mg via ORAL
  Filled 2013-02-23 (×11): qty 1

## 2013-02-23 MED ORDER — SIMVASTATIN 5 MG PO TABS
5.0000 mg | ORAL_TABLET | Freq: Every day | ORAL | Status: DC
  Administered 2013-02-24 – 2013-02-27 (×3): 5 mg via ORAL
  Filled 2013-02-23 (×7): qty 1

## 2013-02-23 MED ORDER — IPRATROPIUM BROMIDE 0.02 % IN SOLN
0.5000 mg | Freq: Once | RESPIRATORY_TRACT | Status: AC
  Administered 2013-02-23: 0.5 mg via RESPIRATORY_TRACT
  Filled 2013-02-23: qty 2.5

## 2013-02-23 MED ORDER — FUROSEMIDE 10 MG/ML IJ SOLN
40.0000 mg | INTRAMUSCULAR | Status: AC
  Administered 2013-02-23: 40 mg via INTRAVENOUS
  Filled 2013-02-23: qty 4

## 2013-02-23 MED ORDER — IPRATROPIUM BROMIDE 0.02 % IN SOLN
0.5000 mg | Freq: Four times a day (QID) | RESPIRATORY_TRACT | Status: DC
  Administered 2013-02-23 – 2013-02-25 (×7): 0.5 mg via RESPIRATORY_TRACT
  Filled 2013-02-23 (×7): qty 2.5

## 2013-02-23 MED ORDER — ACETAMINOPHEN 325 MG PO TABS
650.0000 mg | ORAL_TABLET | Freq: Four times a day (QID) | ORAL | Status: DC | PRN
  Administered 2013-02-25: 650 mg via ORAL
  Filled 2013-02-23: qty 2

## 2013-02-23 MED ORDER — ALBUTEROL SULFATE (5 MG/ML) 0.5% IN NEBU
2.5000 mg | INHALATION_SOLUTION | Freq: Once | RESPIRATORY_TRACT | Status: AC
  Administered 2013-02-23: 2.5 mg via RESPIRATORY_TRACT
  Filled 2013-02-23: qty 0.5

## 2013-02-23 MED ORDER — ONDANSETRON HCL 4 MG PO TABS: 4.0000 mg | ORAL_TABLET | Freq: Four times a day (QID) | ORAL | Status: DC | PRN

## 2013-02-23 MED ORDER — ACETAMINOPHEN 650 MG RE SUPP: 650.0000 mg | Freq: Four times a day (QID) | RECTAL | Status: DC | PRN

## 2013-02-23 MED ORDER — CLOPIDOGREL BISULFATE 75 MG PO TABS
75.0000 mg | ORAL_TABLET | Freq: Every day | ORAL | Status: DC
  Administered 2013-02-24 – 2013-02-28 (×5): 75 mg via ORAL
  Filled 2013-02-23 (×6): qty 1

## 2013-02-23 MED ORDER — ASPIRIN 81 MG PO TABS: 81.0000 mg | ORAL_TABLET | Freq: Every day | ORAL | Status: DC

## 2013-02-23 MED ORDER — ASPIRIN EC 81 MG PO TBEC
81.0000 mg | DELAYED_RELEASE_TABLET | Freq: Every day | ORAL | Status: DC
  Administered 2013-02-24 – 2013-02-28 (×5): 81 mg via ORAL
  Filled 2013-02-23 (×5): qty 1

## 2013-02-23 MED ORDER — PANTOPRAZOLE SODIUM 40 MG PO TBEC
40.0000 mg | DELAYED_RELEASE_TABLET | Freq: Every day | ORAL | Status: DC
  Administered 2013-02-24 – 2013-02-28 (×5): 40 mg via ORAL
  Filled 2013-02-23 (×4): qty 1

## 2013-02-23 MED ORDER — LEVALBUTEROL HCL 0.63 MG/3ML IN NEBU: 0.6300 mg | INHALATION_SOLUTION | Freq: Four times a day (QID) | RESPIRATORY_TRACT | Status: DC

## 2013-02-23 MED ORDER — FERROUS SULFATE 325 (65 FE) MG PO TABS
325.0000 mg | ORAL_TABLET | Freq: Every day | ORAL | Status: DC
  Administered 2013-02-24 – 2013-02-28 (×5): 325 mg via ORAL
  Filled 2013-02-23 (×6): qty 1

## 2013-02-23 MED ORDER — DIAZEPAM 2 MG PO TABS: 2.0000 mg | ORAL_TABLET | Freq: Two times a day (BID) | ORAL | Status: DC | PRN

## 2013-02-23 MED ORDER — METHYLPREDNISOLONE SODIUM SUCC 40 MG IJ SOLR
40.0000 mg | Freq: Two times a day (BID) | INTRAMUSCULAR | Status: DC
  Administered 2013-02-23 – 2013-02-24 (×2): 40 mg via INTRAVENOUS
  Filled 2013-02-23 (×4): qty 1

## 2013-02-23 MED ORDER — POTASSIUM CHLORIDE 20 MEQ PO PACK: 20.0000 meq | PACK | Freq: Two times a day (BID) | ORAL | Status: DC

## 2013-02-23 MED ORDER — ALBUTEROL SULFATE (5 MG/ML) 0.5% IN NEBU
2.5000 mg | INHALATION_SOLUTION | Freq: Four times a day (QID) | RESPIRATORY_TRACT | Status: DC
  Administered 2013-02-23 – 2013-02-24 (×3): 2.5 mg via RESPIRATORY_TRACT
  Administered 2013-02-24: 08:00:00 via RESPIRATORY_TRACT
  Administered 2013-02-24 – 2013-02-25 (×3): 2.5 mg via RESPIRATORY_TRACT
  Filled 2013-02-23 (×7): qty 0.5

## 2013-02-23 NOTE — H&P (Addendum)
[facility] History and Physical  [name] AVW:[id number] DOB: 29-Aug-1926 DOA: 02/23/2013  Referring physician:   PCP: [name], MD   Chief Complaint: Shortness of Breath    HPI:  79 year old female with known coronary artery disease with LAD occlusion filled via collateral blood flow, last catheterization  02/03/2011 in the setting of unstable angina, who presented with increasing shortness of breath.She has a hx of COPD / CHF who was recently admitted to the hospital for SOB and found to be fluid overload. She ws diuresed and d/c home last week - since then she has gained 9 pounds and feeling more sOB over the last sveral days - family noted pt to be in moderate distress and requiring continuous home O2 secondary to sat's in the low 80's  Her left ventriculogram at the setting of catheterization demonstrated mild anterior wall hypokinesis but was overall felt to be in the EF 55% range.   She has a history of chronic COPD/asthma. Interestingly, never smoked. Chest x-ray demonstrated evidence of pulmonary congestion as well as cardiomegaly. She denies any chest pain currently but she does have chronic epigastric discomfort. No recent hematemesis, nausea, vomiting, diarrhea. Patient states that she was sleeping when she became short of breath which woke her from sleep. Patient admits to sleeping with 2 L oxygen via nasal cannula at night.   Last echocardiogram shows  anterior septal wall hypokinesis and her ejection fraction appears to be mildly to moderately reduced in the 40-50% range.  BNP 1602      Review of Systems: negative for the following  Constitutional: Denies fever, chills, diaphoresis, appetite change and fatigue.  HEENT: Denies photophobia, eye pain, redness, hearing loss, ear pain, congestion, sore throat, rhinorrhea, sneezing, mouth sores, trouble swallowing, neck pain, neck stiffness and tinnitus.  Respiratory: Denies SOB, DOE, cough, chest tightness, and  wheezing.  Cardiovascular: Denies chest pain, palpitations and leg swelling.  Gastrointestinal: Denies nausea, vomiting, abdominal pain, diarrhea, constipation, blood in stool and abdominal distention.  Genitourinary: Denies dysuria, urgency, frequency, hematuria, flank pain and difficulty urinating.  Musculoskeletal: Denies myalgias, back pain, joint swelling, arthralgias and gait problem.  Skin: Denies pallor, rash and wound.  Neurological: Denies dizziness, seizures, syncope, weakness, light-headedness, numbness and headaches.  Hematological: Denies adenopathy. Easy bruising, personal or family bleeding history  Psychiatric/Behavioral: Denies suicidal ideation, mood changes, confusion, nervousness, sleep disturbance and agitation       Past Medical History  Diagnosis Date  . DVT (deep venous thrombosis)   . Anemia   . Back injury   . Acid reflux   . Hypertension   . Hypercholesterolemia   . Anginal pain   . Asthma   . Hyponatremia   . Congestive heart failure      Past Surgical History  Procedure Laterality Date  . Abdominal surgery    . Tonsillectomy    . Hernia repair    . Angioplasty      no stent  . Vesicovaginal fistula closure w/ tah    . Breast biopsy      left side  . Knee surgery      left knee  . Leg bypass      left leg      Social History:  reports that she has never smoked. She has never used smokeless tobacco. She reports that she does not drink alcohol or use illicit drugs.    Allergies  Allergen Reactions  . Sulfa Antibiotics Other (See Comments)    unknown  Family History  Problem Relation Age of Onset  . Emphysema Brother     2 brothers  . Emphysema Father   . Heart failure Mother   . Diabetes Mother      Prior to Admission medications   Medication Sig Start Date End Date Taking? Authorizing Provider  albuterol (PROVENTIL HFA;VENTOLIN HFA) 108 (90 BASE) MCG/ACT inhaler Inhale 2 puffs into the lungs every 6 (six) hours as needed  for wheezing.   Yes Historical Provider, MD  aspirin 81 MG tablet Take 81 mg by mouth daily.   Yes Historical Provider, MD  Chlorphen-Pseudoephed-APAP (TYLENOL ALLERGY SINUS PO) Take 1 tablet by mouth 2 (two) times daily as needed (Sinus and allergy symptoms).   Yes Historical Provider, MD  Cholecalciferol (VITAMIN D) 1000 UNITS capsule Take 1,000 Units by mouth daily.   Yes Historical Provider, MD  clopidogrel (PLAVIX) 75 MG tablet Take 75 mg by mouth daily.   Yes Historical Provider, MD  diazepam (VALIUM) 5 MG tablet Take 5 mg by mouth daily as needed.     Yes Historical Provider, MD  ferrous sulfate 325 (65 FE) MG tablet Take 325 mg by mouth daily with breakfast.     Yes Historical Provider, MD  Fluticasone-Salmeterol (ADVAIR DISKUS) 100-50 MCG/DOSE AEPB 1 puff then rise mouth, two times every day 07/12/12 07/12/13 Yes Clinton D Young, MD  isosorbide mononitrate (IMDUR) 30 MG 24 hr tablet Take 30 mg by mouth daily.   Yes Historical Provider, MD  lovastatin (MEVACOR) 20 MG tablet Take 20 mg by mouth every morning.    Yes Historical Provider, MD  metoprolol tartrate (LOPRESSOR) 25 MG tablet Take 25 mg by mouth 2 (two) times daily.   Yes Historical Provider, MD  pantoprazole (PROTONIX) 40 MG tablet Take 40 mg by mouth daily.   Yes Historical Provider, MD  potassium chloride (KLOR-CON) 20 MEQ packet Take 20 mEq by mouth 2 (two) times daily.   Yes Historical Provider, MD  promethazine (PHENERGAN) 12.5 MG tablet Take 12.5 mg by mouth every 6 (six) hours as needed for nausea.   Yes Historical Provider, MD  ranitidine (ZANTAC) 150 MG tablet Take 150 mg by mouth 2 (two) times daily.     Yes Historical Provider, MD  tiotropium (SPIRIVA) 18 MCG inhalation capsule Place 18 mcg into inhaler and inhale daily.   Yes Historical Provider, MD  furosemide (LASIX) 40 MG tablet Take 0.5 tablets (20 mg total) by mouth daily. 02/11/13   Joseph Art, DO  nitroGLYCERIN (NITROSTAT) 0.4 MG SL tablet Place 0.4 mg under the  tongue every 5 (five) minutes as needed.      Historical Provider, MD     Physical Exam: Filed Vitals:   02/23/13 1259 02/23/13 1303 02/23/13 1304 02/23/13 1313  BP:  127/72    Pulse:  78    Temp:    98.6 F (37 C)  TempSrc:    Oral  SpO2: 98% 98% 98%      Constitutional: Vital signs reviewed. Patient is a well-developed and well-nourished in no acute distress and cooperative with exam. Alert and oriented x3.  Head: Normocephalic and atraumatic  Ear: TM normal bilaterally  Mouth: no erythema or exudates, MMM  Eyes: PERRL, EOMI, conjunctivae normal, No scleral icterus.  Neck: Supple, Trachea midline normal ROM, No JVD, mass, thyromegaly, or carotid bruit present.  Cardiovascular: RRR, S1 normal, S2 normal, no MRG, pulses symmetric and intact bilaterally  Pulmonary/Chest: CTAB, no wheezes, rales, or rhonchi  Abdominal: Soft. Non-tender,  non-distended, bowel sounds are normal, no masses, organomegaly, or guarding present.  GU: no CVA tenderness Musculoskeletal: No joint deformities, erythema, or stiffness, ROM full and no nontender Ext: no edema and no cyanosis, pulses palpable bilaterally (DP and PT)  Hematology: no cervical, inginal, or axillary adenopathy.  Neurological: A&O x3, Strenght is normal and symmetric bilaterally, cranial nerve II-XII are grossly intact, no focal motor deficit, sensory intact to light touch bilaterally.  Skin: Warm, dry and intact. No rash, cyanosis, or clubbing.  Psychiatric: Normal mood and affect. speech and behavior is normal. Judgment and thought content normal. Cognition and memory are normal.       Labs on Admission:    Basic Metabolic Panel:  Recent Labs Lab 02/23/13 1331  NA 123*  K 3.7  CL 86*  CO2 27  GLUCOSE 124*  BUN 15  CREATININE 0.82  CALCIUM 10.0   Liver Function Tests: No results found for this basename: AST, ALT, ALKPHOS, BILITOT, PROT, ALBUMIN,  in the last 168 hours No results found for this basename: LIPASE,  AMYLASE,  in the last 168 hours No results found for this basename: AMMONIA,  in the last 168 hours CBC:  Recent Labs Lab 02/23/13 1331  WBC 8.2  NEUTROABS 6.9  HGB 12.3  HCT 35.0*  MCV 81.6  PLT 184   Cardiac Enzymes: No results found for this basename: CKTOTAL, CKMB, CKMBINDEX, TROPONINI,  in the last 168 hours  BNP (last 3 results)  Recent Labs  02/07/13 0424 02/23/13 1331  PROBNP 803.5* 1602.0*      CBG: No results found for this basename: GLUCAP,  in the last 168 hours  Radiological Exams on Admission: Dg Chest 2 View  02/23/2013  *RADIOLOGY REPORT*  Clinical Data: Confusion.  Shortness of breath.  CHEST - 2 VIEW  Comparison: 02/07/2013.  Findings: The heart is enlarged but stable.  The lungs are clear. Stable eventration of the right hemidiaphragm.  Chronic underlying lung changes.  The bony thorax is grossly intact.  Remote mid thoracic compression fracture is noted.  L1 and L2 compression fractures are noted of uncertain age.  IMPRESSION: No acute cardiopulmonary findings.  Stable cardiac enlargement.   Original Report Authenticated By: Rudie Meyer, M.D.     EKG: Independently reviewed.    Assessment/Plan Principal Problem:   Acute systolic heart failure Active Problems:   Peripheral arterial disease   Coronary artery disease   Asthma   Acute respiratory failure   Hyponatremia   79 year old with known LAD occlusion/coronary artery disease with current echocardiogram demonstrating ejection fraction in the 40-50% range with anteroseptal wall hypokinesis, chronic peripheral arterial disease status post femoral-popliteal bypass, COPD admitted with symptoms consistent with COPD exacerbation     1.sob -this is likely secondary to a combination of COPD/asthma exacerbation and acute systolic/diastolic heart failure. BNP is slightly elevated at >1600. She did respond to both modalities of treatment with nebulizer, steroids, IV Lasix. Continued to trend cardiac  markers. If markers are significantly elevated, one could consider coronary angiogram however at this point, I would promote medical management. She has known LAD occlusion. Continue with IV diuretics. Monitor potassium, creatinine. Continue with metoprolol 25 mg twice a day. Started on iv lasix, and iv steroids   2. CAD-as described above in catheterization last done in 2012. Continuing long acting nitrates, beta blocker.  3. Hypertension-continue to monitor, medications  4. Hyperlipidemia-continue home therapy.  5. Peripheral arterial disease - previously described occluded fem-fem graft in description of catheterization in 2012.  On clopidogrel 75 mg. Aspirin 325 mg. SCD's for DVT prophylaxsis as already on ASA/PLAVIX  6. Hyponatremia chronic ,baseline 129-130, start fluid restriction ,no salt restriction  Code Status:   full Family Communication: bedside Disposition Plan: admit   Time spent: 70 mins   [facility] Pager [phone]  If 7PM-7AM, please contact night-coverage [email or web address] Password [email or web address] 02/23/2013, 2:52 PM

## 2013-02-23 NOTE — ED Notes (Signed)
PT lives at home with family . Pt reports increased SHOB and has needed to use home O2 more frequently since Friday. Pt arrived to ED with 5 liters nasal O2 and 97 % on 5 liters of nasal O2.

## 2013-02-23 NOTE — ED Provider Notes (Signed)
History     CSN: [id number]  Arrival date & time 02/23/13  1253   First MD Initiated Contact with Patient 02/23/13 1300      Chief Complaint  Patient presents with  . Shortness of Breath    (Consider location/radiation/quality/duration/timing/severity/associated sxs/prior treatment) HPI Comments: Patient is an 79 year old female with a history of acute respiratory failure, for which she was hospitalized on 02/07/2013 with a d/c date of 02/11/2013, CAD, HTN, and pop-fem bypass who presents for sudden onset of shortness of breath last night. Patient states that she was sleeping when she became short of breath which woke her from sleep. Patient admits to sleeping with 2 L oxygen via nasal cannula at night. Patient admits to an associated dry cough and nausea and states "I feel lousy". Patient denies fever, chest pain, bloating or abdominal pain, and syncope. Patient is a poor historian secondary to her shortness of breath; speaking for longer periods of time make the patient tachypneic.   Patient is a 79 y.o. female presenting with shortness of breath and CHF. The history is provided by the patient. No language interpreter was used.  Shortness of Breath Associated symptoms: cough   Associated symptoms: no abdominal pain, no chest pain, no fever, no rash and no vomiting   Congestive Heart Failure Associated symptoms include coughing, fatigue, nausea and weakness. Pertinent negatives include no abdominal pain, chest pain, fever, rash or vomiting.    Past Medical History  Diagnosis Date  . DVT (deep venous thrombosis)   . Anemia   . Back injury   . Acid reflux   . Hypertension   . Hypercholesterolemia   . Anginal pain   . Asthma   . Hyponatremia   . Congestive heart failure     Past Surgical History  Procedure Laterality Date  . Abdominal surgery    . Tonsillectomy    . Hernia repair    . Angioplasty      no stent  . Vesicovaginal fistula closure w/ tah    . Breast biopsy     left side  . Knee surgery      left knee  . Leg bypass      left leg    Family History  Problem Relation Age of Onset  . Emphysema Brother     2 brothers  . Emphysema Father   . Heart failure Mother   . Diabetes Mother     History  Substance Use Topics  . Smoking status: Never Smoker   . Smokeless tobacco: Never Used  . Alcohol Use: No    OB History   Grav Para Term Preterm Abortions TAB SAB Ect Mult Living                  Review of Systems  Constitutional: Positive for fatigue. Negative for fever.  HENT: Negative for trouble swallowing.   Respiratory: Positive for cough and shortness of breath.   Cardiovascular: Negative for chest pain and leg swelling.  Gastrointestinal: Positive for nausea. Negative for vomiting and abdominal pain.  Genitourinary: Negative for dysuria.  Skin: Negative for color change and rash.  Neurological: Positive for weakness. Negative for syncope.  All other systems reviewed and are negative.    Allergies  Sulfa antibiotics  Home Medications   Current Outpatient Rx  Name  Route  Sig  Dispense  Refill  . albuterol (PROVENTIL HFA;VENTOLIN HFA) 108 (90 BASE) MCG/ACT inhaler   Inhalation   Inhale 2 puffs into the lungs every  6 (six) hours as needed for wheezing.         Marland Kitchen aspirin 81 MG tablet   Oral   Take 81 mg by mouth daily.         . Chlorphen-Pseudoephed-APAP (TYLENOL ALLERGY SINUS PO)   Oral   Take 1 tablet by mouth 2 (two) times daily as needed (Sinus and allergy symptoms).         . Cholecalciferol (VITAMIN D) 1000 UNITS capsule   Oral   Take 1,000 Units by mouth daily.         . clopidogrel (PLAVIX) 75 MG tablet   Oral   Take 75 mg by mouth daily.         . diazepam (VALIUM) 5 MG tablet   Oral   Take 5 mg by mouth daily as needed.           . ferrous sulfate 325 (65 FE) MG tablet   Oral   Take 325 mg by mouth daily with breakfast.           . Fluticasone-Salmeterol (ADVAIR DISKUS) 100-50  MCG/DOSE AEPB      1 puff then rise mouth, two times every day   60 each   prn   . isosorbide mononitrate (IMDUR) 30 MG 24 hr tablet   Oral   Take 30 mg by mouth daily.         Marland Kitchen lovastatin (MEVACOR) 20 MG tablet   Oral   Take 20 mg by mouth every morning.          . metoprolol tartrate (LOPRESSOR) 25 MG tablet   Oral   Take 25 mg by mouth 2 (two) times daily.         . pantoprazole (PROTONIX) 40 MG tablet   Oral   Take 40 mg by mouth daily.         . potassium chloride (KLOR-CON) 20 MEQ packet   Oral   Take 20 mEq by mouth 2 (two) times daily.         . promethazine (PHENERGAN) 12.5 MG tablet   Oral   Take 12.5 mg by mouth every 6 (six) hours as needed for nausea.         . ranitidine (ZANTAC) 150 MG tablet   Oral   Take 150 mg by mouth 2 (two) times daily.           Marland Kitchen tiotropium (SPIRIVA) 18 MCG inhalation capsule   Inhalation   Place 18 mcg into inhaler and inhale daily.         . furosemide (LASIX) 40 MG tablet   Oral   Take 0.5 tablets (20 mg total) by mouth daily.   45 tablet   0   . nitroGLYCERIN (NITROSTAT) 0.4 MG SL tablet   Sublingual   Place 0.4 mg under the tongue every 5 (five) minutes as needed.             BP 128/70  Pulse 88  Temp(Src) 98.6 F (37 C) (Oral)  Resp 27  SpO2 93%  Physical Exam  Nursing note and vitals reviewed. Constitutional:  Patient is ill, but nontoxic, appearing; in no acute respiratory distress  HENT:  Head: Normocephalic and atraumatic.  Mouth/Throat: Oropharynx is clear and moist. No oropharyngeal exudate.  Eyes: Conjunctivae are normal. Pupils are equal, round, and reactive to light. No scleral icterus.  Neck: Normal range of motion. Neck supple.  Cardiovascular: Normal rate, regular rhythm and normal heart sounds.  Distal radial, dorsalis pedis, and posterior tibial pulses 2+ bilaterally. Capillary refill <2 seconds b/l.  Pulmonary/Chest: No respiratory distress. She has no wheezes. She has  no rales.  Poor inspiratory effort with decreased breath sounds appreciated diffusely. No wheezing, rales, or rhonchi appreciated on auscultation.  Abdominal: Soft. She exhibits no distension. There is no tenderness. There is no rebound and no guarding.  Musculoskeletal: Normal range of motion.  No swelling or pitting edema appreciated in patient's b/l lower extremities  Lymphadenopathy:    She has no cervical adenopathy.  Neurological: She is alert.  Patient A&Ox2 (to person and place) and answers questions appropriately. Patient refrains from speaking in full sentences as this makes her more short of breath.  Skin: Skin is warm and dry. No rash noted. She is not diaphoretic. No erythema.  Psychiatric: She has a normal mood and affect. Her behavior is normal.    ED Course  Procedures (including critical care time)  Labs Reviewed  CBC WITH DIFFERENTIAL - Abnormal; Notable for the following:    HCT 35.0 (*)    Neutrophils Relative 84 (*)    Lymphocytes Relative 10 (*)    All other components within normal limits  BASIC METABOLIC PANEL - Abnormal; Notable for the following:    Sodium 123 (*)    Chloride 86 (*)    Glucose, Bld 124 (*)    GFR calc non Af Amer 64 (*)    GFR calc Af Amer 74 (*)    All other components within normal limits  PRO B NATRIURETIC PEPTIDE - Abnormal; Notable for the following:    Pro B Natriuretic peptide (BNP) 1602.0 (*)    All other components within normal limits  URINALYSIS, ROUTINE W REFLEX MICROSCOPIC - Abnormal; Notable for the following:    APPearance CLOUDY (*)    Leukocytes, UA TRACE (*)    All other components within normal limits  URINE MICROSCOPIC-ADD ON - Abnormal; Notable for the following:    Bacteria, UA MANY (*)    All other components within normal limits  URINE CULTURE  POCT I-STAT TROPONIN I   Dg Chest 2 View  02/23/2013  *RADIOLOGY REPORT*  Clinical Data: Confusion.  Shortness of breath.  CHEST - 2 VIEW  Comparison: 02/07/2013.   Findings: The heart is enlarged but stable.  The lungs are clear. Stable eventration of the right hemidiaphragm.  Chronic underlying lung changes.  The bony thorax is grossly intact.  Remote mid thoracic compression fracture is noted.  L1 and L2 compression fractures are noted of uncertain age.  IMPRESSION: No acute cardiopulmonary findings.  Stable cardiac enlargement.   Original Report Authenticated By: Rudie Meyer, M.D.     1. CHF (congestive heart failure)   2. Hyponatremia     MDM  Patient presents for progressive dyspnea since last night with O2 saturation dropping to the 80's at home. On initial exam patient ill, but nontoxic, appearing, satting 99% on 3L [place]. Patient increasingly tachypneic with prolonged speech; poor inspiratory effort with diffusely decreased breath sounds on auscultation. HRRR with intact distal pulses and no evidence of swelling or pitting edema in the b/l lower extremities. W/u to include CBC, BMP, BNP, troponin, UA, EKG, and CXR. Albuterol and atrovent breathing tx ordered for symptomatic improvement. Patient work up and management discussed with Dr. Hyacinth Meeker who is in agreement.  Patient has had a 9 pound weight gain since discharge 12 days ago and BNP elevated to 1602; symptoms and labs consistent with CHF process.  Sodium level also decreased to 123; lower than past levels. T wave abnormalities appreciated on EKG, not apparent on prior 02/07/2013. Hospitalist, Dr. Susie Cassette, consulted by Dr. Hyacinth Meeker; patient to be admitted for further work up and evaluation of CHF exacerbation. Patient hemodynamically stable at this time and satting well on 3L [place].   Date: 02/23/2013  Rate: 78  Rhythm: normal sinus rhythm  QRS Axis: left  Intervals: normal  ST/T Wave abnormalities: nonspecific T wave changes  Conduction Disutrbances:none  Narrative Interpretation: NSR with LVH and nonspecific T wave abnormalities; no STEMI  Old EKG Reviewed: T wave abnormalities not apparent on EKG from  02/07/2013        Antony Madura, PA-C 02/23/13 1649

## 2013-02-23 NOTE — ED Provider Notes (Signed)
Pt is an elderly female with hx of COPD / CHF who was recently admitted to the hospital for SOB and found to be fluid overload.  She ws diuresed and d/c home last week - since then she has gained 9 pounds and feeling more sOB over the last sveral days - family noted pt to be in moderate distress and requiring continuous home O2 secondary to sat's in the low 80's.  (Daughter is a Engineer, civil (consulting)).  On exam has faint rales, mild tachypnea, no sig peripheral edema.  ECG with LVH and non spec TWA's.  Likely needs admission - last echo was from 2 week ago - EF of 40-50%.  Medical screening examination/treatment/procedure(s) were conducted as a shared visit with non-physician practitioner(s) and myself.  I personally evaluated the patient during the encounter    Vida Roller, MD 02/23/13 1410

## 2013-02-24 DIAGNOSIS — E871 Hypo-osmolality and hyponatremia: Secondary | ICD-10-CM

## 2013-02-24 DIAGNOSIS — R0602 Shortness of breath: Secondary | ICD-10-CM

## 2013-02-24 DIAGNOSIS — R0989 Other specified symptoms and signs involving the circulatory and respiratory systems: Secondary | ICD-10-CM

## 2013-02-24 LAB — BLOOD GAS, ARTERIAL
Acid-Base Excess: 1.5 mmol/L (ref 0.0–2.0)
Drawn by: 270211
O2 Content: 4 L/min
O2 Saturation: 99.1 %
pO2, Arterial: 105 mmHg — ABNORMAL HIGH (ref 80.0–100.0)

## 2013-02-24 LAB — BASIC METABOLIC PANEL
CO2: 27 mEq/L (ref 19–32)
Chloride: 85 mEq/L — ABNORMAL LOW (ref 96–112)
GFR calc Af Amer: 61 mL/min — ABNORMAL LOW (ref >90)
Potassium: 4.1 mEq/L (ref 3.5–5.1)
Sodium: 122 mEq/L — ABNORMAL LOW (ref 135–145)

## 2013-02-24 LAB — TSH: TSH: 0.73 u[IU]/mL (ref 0.350–4.500)

## 2013-02-24 LAB — TROPONIN I: Troponin I: <0.3 ng/mL (ref <0.30)

## 2013-02-24 LAB — CBC
MCH: 28.1 pg (ref 26.0–34.0)
MCHC: 34.4 g/dL (ref 30.0–36.0)
Platelets: 193 10*3/uL (ref 150–400)

## 2013-02-24 LAB — PRO B NATRIURETIC PEPTIDE: Pro B Natriuretic peptide (BNP): 677.9 pg/mL — ABNORMAL HIGH (ref 0–450)

## 2013-02-24 MED ORDER — SIMETHICONE 80 MG PO CHEW
80.0000 mg | CHEWABLE_TABLET | Freq: Once | ORAL | Status: AC
  Administered 2013-02-24: 80 mg via ORAL
  Filled 2013-02-24: qty 1

## 2013-02-24 MED ORDER — CEFTRIAXONE SODIUM 1 G IJ SOLR
1.0000 g | INTRAMUSCULAR | Status: DC
  Administered 2013-02-24 – 2013-02-28 (×5): 1 g via INTRAVENOUS
  Filled 2013-02-24 (×6): qty 10

## 2013-02-24 MED ORDER — METHYLPREDNISOLONE SODIUM SUCC 40 MG IJ SOLR
40.0000 mg | Freq: Every day | INTRAMUSCULAR | Status: DC
  Filled 2013-02-24: qty 1

## 2013-02-24 MED ORDER — FUROSEMIDE 10 MG/ML IJ SOLN
INTRAMUSCULAR | Status: AC
  Filled 2013-02-24: qty 4

## 2013-02-24 NOTE — ED Provider Notes (Signed)
Medical screening examination/treatment/procedure(s) were conducted as a shared visit with non-physician practitioner(s) and myself.  I personally evaluated the patient during the encounter  Please see my separate respective documentation pertaining to this patient encounter   Vida Roller, MD 02/24/13 1018

## 2013-02-24 NOTE — Plan of Care (Signed)
Problem: Phase I Progression Outcomes Goal: EF % per last Echo/documented,Core Reminder form on chart Outcome: Completed/Met Date Met:  02/24/13 EF 40-50%(02-07-13)

## 2013-02-24 NOTE — Progress Notes (Signed)
[facility] PROGRESS NOTE  [name] GNF:[id number] DOB: [birth year] DOA: 02/23/2013 PCP: [name], MD  Assessment/Plan: 1.sob -this is likely secondary to a combination of COPD/asthma exacerbation and acute systolic/diastolic heart failure. BNP is slightly elevated at >1600.  And is now 600- weight at d/c was 161- came in at 163 lbs  She did respond to both modalities of treatment with nebulizer, steroids, IV Lasix- decreased dose tomm based on output   cardiac markers negative.  She has known LAD occlusion. Continue with IV diuretics. Monitor potassium, creatinine. Continue with metoprolol 25 mg twice a day. Started on iv lasix, and iv steroids  ? Compliance with diet  2. CAD-as described above in catheterization last done in 2012. Continuing long acting nitrates, beta blocker.   3. Hypertension-continue to monitor, medications   4. Hyperlipidemia-continue home therapy.   5. Peripheral arterial disease - previously described occluded fem-fem graft in description of catheterization in 2012. On clopidogrel 75 mg. Aspirin 325 mg. SCD's for DVT prophylaxsis as already on ASA/PLAVIX   6. Hyponatremia chronic ,baseline 129-130, start fluid restriction    Code Status: full Family Communication: husband at bedside Disposition Plan:    Consultants:  none  Procedures:  none  Antibiotics:    HPI/Subjective: Sleeping, did not sleep well last night  Objective: Filed Vitals:   02/23/13 2040 02/23/13 2041 02/24/13 0027 02/24/13 0503  BP:   109/62 144/73  Pulse:   84 88  Temp:    99.5 F (37.5 C)  TempSrc:    Oral  Resp:   21 18  Height:      Weight:    69.174 kg (152 lb 8 oz)  SpO2: 92% 92% 95% 97%    Intake/Output Summary (Last 24 hours) at 02/24/13 1016 Last data filed at 02/24/13 0859  Gross per 24 hour  Intake    320 ml  Output   2150 ml  Net  -1830 ml   Filed Weights   02/23/13 1706 02/24/13 0503  Weight: 69.673 kg (153 lb 9.6 oz) 69.174 kg  (152 lb 8 oz)    Exam:   General:  Sleepy- did not sleep well last night  Cardiovascular: rrr  Respiratory: decreased BS b/l  Abdomen: +BS, soft, NT/ND  Musculoskeletal: moves all 4 ext   Data Reviewed: Basic Metabolic Panel:  Recent Labs Lab 02/23/13 1331 02/24/13 0629  NA 123* 122*  K 3.7 4.1  CL 86* 85*  CO2 27 27  GLUCOSE 124* 131*  BUN 15 22  CREATININE 0.82 0.96  CALCIUM 10.0 10.2   Liver Function Tests: No results found for this basename: AST, ALT, ALKPHOS, BILITOT, PROT, ALBUMIN,  in the last 168 hours No results found for this basename: LIPASE, AMYLASE,  in the last 168 hours No results found for this basename: AMMONIA,  in the last 168 hours CBC:  Recent Labs Lab 02/23/13 1331 02/24/13 0629  WBC 8.2 6.1  NEUTROABS 6.9  --   HGB 12.3 12.2  HCT 35.0* 35.5*  MCV 81.6 81.8  PLT 184 193   Cardiac Enzymes:  Recent Labs Lab 02/23/13 1717 02/24/13 0003 02/24/13 0629  TROPONINI <0.30 <0.30 <0.30   BNP (last 3 results)  Recent Labs  02/07/13 0424 02/23/13 1331 02/24/13 0629  PROBNP 803.5* 1602.0* 677.9*   CBG: No results found for this basename: GLUCAP,  in the last 168 hours  No results found for this or any previous visit (from the past 240 hour(s)).   Studies: Dg Chest  2 View  02/23/2013  *RADIOLOGY REPORT*  Clinical Data: Confusion.  Shortness of breath.  CHEST - 2 VIEW  Comparison: 02/07/2013.  Findings: The heart is enlarged but stable.  The lungs are clear. Stable eventration of the right hemidiaphragm.  Chronic underlying lung changes.  The bony thorax is grossly intact.  Remote mid thoracic compression fracture is noted.  L1 and L2 compression fractures are noted of uncertain age.  IMPRESSION: No acute cardiopulmonary findings.  Stable cardiac enlargement.   Original Report Authenticated By: Rudie Meyer, M.D.     Scheduled Meds: . albuterol  2.5 mg Nebulization QID  . aspirin EC  81 mg Oral Daily  . clopidogrel  75 mg Oral Q  breakfast  . ferrous sulfate  325 mg Oral Q breakfast  . furosemide      . furosemide  40 mg Intravenous Q12H  . ipratropium  0.5 mg Nebulization QID  . isosorbide mononitrate  30 mg Oral Daily  . methylPREDNISolone (SOLU-MEDROL) injection  40 mg Intravenous Q12H  . metoprolol tartrate  25 mg Oral BID  . mometasone-formoterol  2 puff Inhalation BID  . pantoprazole  40 mg Oral Daily  . potassium chloride  40 mEq Oral Daily  . simvastatin  5 mg Oral q1800  . sodium chloride  3 mL Intravenous Q12H   Continuous Infusions:   Principal Problem:   Acute systolic heart failure Active Problems:   Peripheral arterial disease   Coronary artery disease   Asthma   Acute respiratory failure   Hyponatremia    Time spent: 35    [facility], Yuvonne Lanahan  [facility] Pager [phone]. If 7PM-7AM, please contact night-coverage at [email or web address], password [facility] 02/24/2013, 10:16 AM  LOS: 1 day

## 2013-02-25 ENCOUNTER — Encounter (HOSPITAL_COMMUNITY): Payer: Self-pay | Admitting: Radiology

## 2013-02-25 DIAGNOSIS — J9600 Acute respiratory failure, unspecified whether with hypoxia or hypercapnia: Secondary | ICD-10-CM

## 2013-02-25 DIAGNOSIS — N390 Urinary tract infection, site not specified: Secondary | ICD-10-CM

## 2013-02-25 DIAGNOSIS — A498 Other bacterial infections of unspecified site: Secondary | ICD-10-CM

## 2013-02-25 LAB — URINE CULTURE: Colony Count: >=100000

## 2013-02-25 LAB — CBC
HCT: 36.1 % (ref 36.0–46.0)
Hemoglobin: 12.5 g/dL (ref 12.0–15.0)
MCH: 28 pg (ref 26.0–34.0)
MCHC: 34.6 g/dL (ref 30.0–36.0)
MCV: 80.9 fL (ref 78.0–100.0)

## 2013-02-25 LAB — BASIC METABOLIC PANEL
BUN: 33 mg/dL — ABNORMAL HIGH (ref 6–23)
GFR calc non Af Amer: 52 mL/min — ABNORMAL LOW (ref >90)
Glucose, Bld: 104 mg/dL — ABNORMAL HIGH (ref 70–99)
Potassium: 3.4 mEq/L — ABNORMAL LOW (ref 3.5–5.1)

## 2013-02-25 MED ORDER — ALBUTEROL SULFATE (5 MG/ML) 0.5% IN NEBU
2.5000 mg | INHALATION_SOLUTION | Freq: Three times a day (TID) | RESPIRATORY_TRACT | Status: DC
  Administered 2013-02-25 – 2013-02-28 (×9): 2.5 mg via RESPIRATORY_TRACT
  Filled 2013-02-25 (×9): qty 0.5

## 2013-02-25 MED ORDER — IPRATROPIUM BROMIDE 0.02 % IN SOLN
0.5000 mg | Freq: Three times a day (TID) | RESPIRATORY_TRACT | Status: DC
  Administered 2013-02-25 – 2013-02-28 (×9): 0.5 mg via RESPIRATORY_TRACT
  Filled 2013-02-25 (×9): qty 2.5

## 2013-02-25 MED ORDER — PREDNISONE 20 MG PO TABS
40.0000 mg | ORAL_TABLET | Freq: Every day | ORAL | Status: DC
  Administered 2013-02-25 – 2013-02-28 (×4): 40 mg via ORAL
  Filled 2013-02-25 (×5): qty 2

## 2013-02-25 MED ORDER — FUROSEMIDE 10 MG/ML IJ SOLN
40.0000 mg | Freq: Every day | INTRAMUSCULAR | Status: DC
  Filled 2013-02-25: qty 4

## 2013-02-25 MED ORDER — GI COCKTAIL ~~LOC~~
30.0000 mL | Freq: Once | ORAL | Status: AC
  Administered 2013-02-25: 30 mL via ORAL
  Filled 2013-02-25: qty 30

## 2013-02-25 NOTE — Progress Notes (Signed)
Utilization Review Completed.   Theoren Palka, RN, BSN Nurse Case Manager  [phone]  

## 2013-02-25 NOTE — Evaluation (Signed)
Physical Therapy Evaluation Patient Details Name: [name] MRN: [id number] DOB: 1926/10/24 Today's Date: 02/25/2013 Time: 7829-5621 PT Time Calculation (min): 21 min  PT Assessment / Plan / Recommendation Clinical Impression  Pt is an 79 yo female admitted for fluid overload and SOB. Pt recentely d/c'd home from hospital 2 weeks ago and was functioning independently. Pt now with generalized weakness, impaired balance, increased falls risk, delayed processing, memory deficits, and questionable ability to manage medications at home. Pt now requires 24/7 supervision/assist for safe mobility. Pt to benefit from ST-SNF placement upon d/c to achieve modified indep. function for safe transition home.    PT Assessment  Patient needs continued PT services    Follow Up Recommendations  SNF;Supervision/Assistance - 24 hour    Does the patient have the potential to tolerate intense rehabilitation      Barriers to Discharge  (spouse works part time)      Equipment Recommendations  None recommended by PT    Recommendations for Other Services     Frequency Min 3X/week    Precautions / Restrictions Precautions Precautions: Fall Restrictions Weight Bearing Restrictions: No   Pertinent Vitals/Pain Pt denies pain      Mobility  Bed Mobility Bed Mobility: Supine to Sit;Sitting - Scoot to Edge of Bed Supine to Sit: 3: Mod assist;With rails;HOB flat Details for Bed Mobility Assistance: increased time, delayed processing, max directional v/c's Transfers Transfers: Sit to Stand;Stand to Sit Sit to Stand: 4: Min assist;With upper extremity assist;From bed Stand to Sit: 4: Min assist;With upper extremity assist;To chair/3-in-1 Details for Transfer Assistance: pt with severe posterior lean requiring mod/maxA in standing to prevent loss of balance. pt unaware of posterior lean and is unable to fix it Ambulation/Gait Ambulation/Gait Assistance: 4: Min assist Ambulation Distance (Feet):  60 Feet Assistive device: Rolling walker;None Ambulation/Gait Assistance Details: Pt extremely unsteady without RW and had multiple episodes of LOB  requiring modA to maintain balance. Pt with increased stability with RW however required minA for safe walker managment. Pt required max v/c's for use of RW and sequencing. Pt with poor carry over of instructions Gait Pattern: Step-through pattern;Decreased stride length (staggering, R lateral lean, posterior lean) Gait velocity: decreasd Stairs: No    Exercises     PT Diagnosis: Difficulty walking;Generalized weakness  PT Problem List: Decreased activity tolerance;Decreased balance;Decreased mobility;Decreased knowledge of use of DME;Decreased safety awareness;Decreased knowledge of precautions PT Treatment Interventions: DME instruction;Gait training;Functional mobility training;Therapeutic activities;Therapeutic exercise;Balance training;Patient/family education;Other (comment)   PT Goals Acute Rehab PT Goals PT Goal Formulation: With patient Time For Goal Achievement: 03/11/13 Potential to Achieve Goals: Good Pt will go Supine/Side to Sit: with supervision;with HOB 0 degrees PT Goal: Supine/Side to Sit - Progress: Goal set today Pt will go Sit to Stand: with supervision;with upper extremity assist (up to RW) PT Goal: Sit to Stand - Progress: Goal set today Pt will Ambulate: >150 feet;with supervision;with rolling walker PT Goal: Ambulate - Progress: Goal set today Additional Goals Additional Goal #1: Pt to perform > 18/24 on DGI. PT Goal: Additional Goal #1 - Progress: Goal set today  Visit Information  Last PT Received On: 02/25/13 Assistance Needed: +1    Subjective Data  Subjective: Pt received supine in bed agreeable to PT.   Prior Functioning  Home Living Lives With: Spouse Available Help at Discharge: Family;Available PRN/intermittently (spouse works part time) Type of Home: House Home Access: Level entry Home Layout:  One level Bathroom Shower/Tub: Health visitor: Standard Prior Function  Level of Independence: Independent Able to Take Stairs?: Yes Driving: Yes Vocation: Retired Musician: [employer] Dominant Hand: Right    Cognition  Cognition Arousal/Alertness: Awake/alert Behavior During Therapy: WFL for tasks assessed/performed Overall Cognitive Status:  (decreased insight to deficits, decreased safety awareness) Orientation Level:  (oriented x4)    Extremity/Trunk Assessment Right Upper Extremity Assessment RUE ROM/Strength/Tone: Deficits RUE ROM/Strength/Tone Deficits: generalized weakness, grossly 3+/5 Left Upper Extremity Assessment LUE ROM/Strength/Tone: Deficits LUE ROM/Strength/Tone Deficits: generalized weakness, grossly 3+/5 Right Lower Extremity Assessment RLE ROM/Strength/Tone: Deficits RLE ROM/Strength/Tone Deficits: generalized weakness, grossly 3+/5 Left Lower Extremity Assessment LLE ROM/Strength/Tone: Deficits LLE ROM/Strength/Tone Deficits: generalized weakness, grossly 3+/5 Trunk Assessment Trunk Assessment: Normal   Balance    End of Session PT - End of Session Equipment Utilized During Treatment: Gait belt Activity Tolerance: Patient tolerated treatment well Patient left: in chair;with call bell/phone within reach;with family/visitor present Nurse Communication: Mobility status  GP     [name] 02/25/2013, 4:38 PM [name], PT, DPT Pager #: [phone] Office #: [phone]

## 2013-02-25 NOTE — Progress Notes (Signed)
[facility] PROGRESS NOTE  CHRISS REDEL ZOX:[id number] DOB: Jun 01, 1926 DOA: 02/23/2013 PCP: [name], MD  Assessment/Plan: 1.sob -this is likely secondary to a combination of COPD/asthma exacerbation and acute systolic/diastolic heart failure. BNP is slightly elevated at >1600.  And is now 600- weight at d/c was 161- came in at 163 lbs  She did respond to both modalities of treatment with nebulizer, steroids, IV Lasix- decreased dose to daily - down 2 kg since admission  cardiac markers negative.  She has known LAD occlusion. Continue with IV diuretics. Monitor potassium, creatinine. Continue with metoprolol 25 mg twice a day. Started on iv lasix, and iv steroids  ? Compliance with diet at home  2. CAD-as described above in catheterization last done in 2012. Continuing long acting nitrates, beta blocker.   3. Hypertension-continue to monitor, medications   4. Hyperlipidemia-continue home therapy.   5. Peripheral arterial disease - previously described occluded fem-fem graft in description of catheterization in 2012. On clopidogrel 75 mg. Aspirin 325 mg. SCD's for DVT prophylaxsis as already on ASA/PLAVIX   6. Hyponatremia chronic ,baseline 129-130, start fluid restriction- monitor  7.  Hypokalemia- replete  8. ecoli UTI- present on admission- rocephin day #2   Code Status: full Family Communication: husband at bedside Disposition Plan:    Consultants:  none  Procedures:  none  Antibiotics:    HPI/Subjective: Awake today, not feeling well Mild SOB  Objective: Filed Vitals:   02/24/13 2016 02/25/13 0500 02/25/13 0621 02/25/13 0831  BP: 137/53 112/62    Pulse: 92 86    Temp: 98.6 F (37 C) 99.1 F (37.3 C)    TempSrc: Oral Oral    Resp: 18 16    Height:      Weight:   67.949 kg (149 lb 12.8 oz)   SpO2: 100% 97%  97%    Intake/Output Summary (Last 24 hours) at 02/25/13 0957 Last data filed at 02/25/13 0830  Gross per 24 hour  Intake    1160 ml  Output   1200 ml  Net    -40 ml   Filed Weights   02/23/13 1706 02/24/13 0503 02/25/13 0621  Weight: 69.673 kg (153 lb 9.6 oz) 69.174 kg (152 lb 8 oz) 67.949 kg (149 lb 12.8 oz)    Exam:   General:  Sleepy- did not sleep well last night  Cardiovascular: rrr  Respiratory: decreased BS b/l  Abdomen: +BS, soft, NT/ND  Musculoskeletal: moves all 4 ext   Data Reviewed: Basic Metabolic Panel:  Recent Labs Lab 02/23/13 1331 02/24/13 0629 02/25/13 0700  NA 123* 122* 122*  K 3.7 4.1 3.4*  CL 86* 85* 82*  CO2 27 27 26   GLUCOSE 124* 131* 104*  BUN 15 22 33*  CREATININE 0.82 0.96 0.98  CALCIUM 10.0 10.2 10.5   Liver Function Tests: No results found for this basename: AST, ALT, ALKPHOS, BILITOT, PROT, ALBUMIN,  in the last 168 hours No results found for this basename: LIPASE, AMYLASE,  in the last 168 hours No results found for this basename: AMMONIA,  in the last 168 hours CBC:  Recent Labs Lab 02/23/13 1331 02/24/13 0629 02/25/13 0700  WBC 8.2 6.1 9.0  NEUTROABS 6.9  --   --   HGB 12.3 12.2 12.5  HCT 35.0* 35.5* 36.1  MCV 81.6 81.8 80.9  PLT 184 193 193   Cardiac Enzymes:  Recent Labs Lab 02/23/13 1717 02/24/13 0003 02/24/13 0629  TROPONINI <0.30 <0.30 <0.30   BNP (last 3  results)  Recent Labs  02/07/13 0424 02/23/13 1331 02/24/13 0629  PROBNP 803.5* 1602.0* 677.9*   CBG: No results found for this basename: GLUCAP,  in the last 168 hours  Recent Results (from the past 240 hour(s))  URINE CULTURE     Status: None   Collection Time    02/23/13  2:03 PM      Result Value Range Status   Specimen Description URINE, CATHETERIZED   Final   Special Requests NONE   Final   Culture  Setup Time 02/23/2013 21:01   Final   Colony Count >=100,000 COLONIES/ML   Final   Culture ESCHERICHIA COLI   Final   Report Status PENDING   Incomplete     Studies: Dg Chest 2 View  02/23/2013  *RADIOLOGY REPORT*  Clinical Data: Confusion.  Shortness of  breath.  CHEST - 2 VIEW  Comparison: 02/07/2013.  Findings: The heart is enlarged but stable.  The lungs are clear. Stable eventration of the right hemidiaphragm.  Chronic underlying lung changes.  The bony thorax is grossly intact.  Remote mid thoracic compression fracture is noted.  L1 and L2 compression fractures are noted of uncertain age.  IMPRESSION: No acute cardiopulmonary findings.  Stable cardiac enlargement.   Original Report Authenticated By: Rudie Meyer, M.D.     Scheduled Meds: . albuterol  2.5 mg Nebulization QID  . aspirin EC  81 mg Oral Daily  . cefTRIAXone (ROCEPHIN)  IV  1 g Intravenous Q24H  . clopidogrel  75 mg Oral Q breakfast  . ferrous sulfate  325 mg Oral Q breakfast  . furosemide  40 mg Intravenous Q12H  . ipratropium  0.5 mg Nebulization QID  . isosorbide mononitrate  30 mg Oral Daily  . methylPREDNISolone (SOLU-MEDROL) injection  40 mg Intravenous Daily  . metoprolol tartrate  25 mg Oral BID  . mometasone-formoterol  2 puff Inhalation BID  . pantoprazole  40 mg Oral Daily  . potassium chloride  40 mEq Oral Daily  . simvastatin  5 mg Oral q1800  . sodium chloride  3 mL Intravenous Q12H   Continuous Infusions:   Principal Problem:   Acute systolic heart failure Active Problems:   Peripheral arterial disease   Coronary artery disease   Asthma   Acute respiratory failure   Hyponatremia    Time spent: 35    [facility], Mance Vallejo  [facility] Pager [phone]. If 7PM-7AM, please contact night-coverage at [email or web address], password [facility] 02/25/2013, 9:57 AM  LOS: 2 days

## 2013-02-25 NOTE — Progress Notes (Signed)
Patient states she wears 2L O2 QHS at home. Placed patient on 2L nasal cannula.

## 2013-02-26 LAB — BASIC METABOLIC PANEL
BUN: 37 mg/dL — ABNORMAL HIGH (ref 6–23)
Calcium: 10.1 mg/dL (ref 8.4–10.5)
Creatinine, Ser: 0.89 mg/dL (ref 0.50–1.10)
GFR calc non Af Amer: 58 mL/min — ABNORMAL LOW (ref >90)
Glucose, Bld: 115 mg/dL — ABNORMAL HIGH (ref 70–99)
Sodium: 120 mEq/L — ABNORMAL LOW (ref 135–145)

## 2013-02-26 MED ORDER — FUROSEMIDE 40 MG PO TABS
40.0000 mg | ORAL_TABLET | Freq: Every day | ORAL | Status: DC
  Administered 2013-02-26 – 2013-02-28 (×3): 40 mg via ORAL
  Filled 2013-02-26 (×3): qty 1

## 2013-02-26 NOTE — Progress Notes (Signed)
Met with patient and spouse at bedside to discuss [facility] services.  Their daughter is a [facility] hospital liaison who manages the patient's care.  No further needs assessed. Of note, [facility] Care Management services does not replace or interfere with any services that are arranged by inpatient case management or social work.  For additional questions or referrals please contact Anibal Henderson BSN RN [facility] Liaison at [phone].

## 2013-02-26 NOTE — Progress Notes (Signed)
Physical Therapy Treatment Patient Details Name: [name] MRN: [id number] DOB: Jul 23, 1926 Today's Date: 02/26/2013 Time: 1040-1059 PT Time Calculation (min): 19 min  PT Assessment / Plan / Recommendation Comments on Treatment Session  Pt con't to be pleasantly confused even though orientated. Pt con't to be at increased falls risk and have decreased safety awareness and insight to deficits. Pt to con't to benefit from ST-SNF placement to achieve safe mobilty with supervision for safe transition home with spouse.    Follow Up Recommendations  SNF;Supervision/Assistance - 24 hour     Does the patient have the potential to tolerate intense rehabilitation     Barriers to Discharge        Equipment Recommendations  None recommended by PT    Recommendations for Other Services    Frequency Min 3X/week   Plan Discharge plan remains appropriate;Frequency remains appropriate    Precautions / Restrictions Precautions Precautions: Fall Restrictions Weight Bearing Restrictions: No   Pertinent Vitals/Pain Pt denies pain    Mobility  Bed Mobility Bed Mobility: Supine to Sit;Sitting - Scoot to Edge of Bed Supine to Sit: 4: Min assist;With rails;HOB flat Sitting - Scoot to [place] of Bed: 4: Min assist Details for Bed Mobility Assistance: increased time, delayed processing, v/c's to stay on task Transfers Transfers: Sit to Stand;Stand to Sit Sit to Stand: 4: Min assist;With upper extremity assist;From bed Stand to Sit: 4: Min assist;With upper extremity assist;To chair/3-in-1 Details for Transfer Assistance: pt with improved balance this date compared to yesterday. pt with no posterior lean Ambulation/Gait Ambulation/Gait Assistance: 4: Min assist Ambulation Distance (Feet): 150 Feet Assistive device: Rolling walker Ambulation/Gait Assistance Details: pt with staggering gait without RW. pt with increased stabiltiy with RW however has poor walker management and is unsafe to amb  independently. Pt at increased falls risk regardless of using RW or not. Gait Pattern: Step-through pattern;Decreased stride length Gait velocity: decreasd    Exercises     PT Diagnosis:    PT Problem List:   PT Treatment Interventions:     PT Goals Acute Rehab PT Goals Potential to Achieve Goals: Good PT Goal: Supine/Side to Sit - Progress: Progressing toward goal PT Goal: Sit to Stand - Progress: Progressing toward goal PT Goal: Ambulate - Progress: Progressing toward goal Additional Goals PT Goal: Additional Goal #1 - Progress: Not progressing  Visit Information  Last PT Received On: 02/26/13 Assistance Needed: +1    Subjective Data  Subjective: Pt received supine in bed agreeable to PT.   Cognition  Cognition Arousal/Alertness: Awake/alert Behavior During Therapy: WFL for tasks assessed/performed Overall Cognitive Status: Impaired/Different from baseline Area of Impairment: Awareness;Problem solving Awareness:  (decreased insight to deficits) Problem Solving: Slow processing;Difficulty sequencing General Comments: pt repeatedly ran into things with RW and was unable to focus on trying to manage RW despite v/c's    Balance  Balance Balance Assessed: Yes Dynamic Standing Balance Dynamic Standing - Balance Support: No upper extremity supported Dynamic Standing - Level of Assistance: 4: Min assist Dynamic Standing - Balance Activities:  (marching in place) Dynamic Standing - Comments: pt with posterior LOB multiple times during 10 repetitions requiring maxA to maintain balance.  End of Session PT - End of Session Equipment Utilized During Treatment: Gait belt Activity Tolerance: Patient tolerated treatment well Patient left: in chair;with call bell/phone within reach;with family/visitor present Nurse Communication: Mobility status   GP     [name] 02/26/2013, 1:40 PM  [name], PT, DPT Pager #: [phone]  Office #: [phone]

## 2013-02-26 NOTE — Progress Notes (Signed)
[facility] PROGRESS NOTE  [name] YQM:[id number] DOB: Jan 10, 1926 DOA: 02/23/2013 PCP: [name], MD  Assessment/Plan: 1.sob -this is likely secondary to a combination of COPD/asthma exacerbation and acute systolic/diastolic heart failure. BNP is slightly elevated at >1600.  And is now 600- will change back to PO lasix but at a higher dose than home- may have over diuresed- weight down since admission She did respond to both modalities of treatment with nebulizer, steroids, IV Lasix in the ER-  cardiac markers negative.  She has known LAD occlusion. Continue with IV diuretics. Monitor potassium, creatinine. Continue with metoprolol 25 mg twice a day ? Compliance with diet at home  2. CAD-as described above in catheterization last done in 2012. Continuing long acting nitrates, beta blocker.   3. Hypertension-continue to monitor, medications   4. Hyperlipidemia-continue home therapy.   5. Peripheral arterial disease - previously described occluded fem-fem graft in description of catheterization in 2012. On clopidogrel 75 mg. Aspirin 325 mg. SCD's for DVT prophylaxsis as already on ASA/PLAVIX   6. Hyponatremia chronic ,baseline 129-130, start fluid restriction, check urine osmos and serum osmos, may need nephro consult/medication  7.  Hypokalemia- replete  8. ecoli UTI- present on admission- rocephin day #3   Code Status: full Family Communication: patient at bedside Disposition Plan: SNF (daughter wants)   Consultants:  none  Procedures:  none  Antibiotics:    HPI/Subjective: Awake today, not feeling well Mild SOB  Objective: Filed Vitals:   02/25/13 2054 02/25/13 2112 02/25/13 2130 02/26/13 0409  BP: 121/59   136/80  Pulse: 79   74  Temp: 98.8 F (37.1 C)   99.5 F (37.5 C)  TempSrc: Oral   Oral  Resp: 18   20  Height:      Weight:    67.7 kg (149 lb 4 oz)  SpO2: 96% 95% 98% 98%    Intake/Output Summary (Last 24 hours) at 02/26/13  0818 Last data filed at 02/26/13 0728  Gross per 24 hour  Intake   1213 ml  Output   1475 ml  Net   -262 ml   Filed Weights   02/24/13 0503 02/25/13 0621 02/26/13 0409  Weight: 69.174 kg (152 lb 8 oz) 67.949 kg (149 lb 12.8 oz) 67.7 kg (149 lb 4 oz)    Exam:   General:  Pleasant/cooperative  Cardiovascular: rrr  Respiratory: decreased BS b/l  Abdomen: +BS, soft, NT/ND  Musculoskeletal: moves all 4 ext, mild edema  Data Reviewed: Basic Metabolic Panel:  Recent Labs Lab 02/23/13 1331 02/24/13 0629 02/25/13 0700 02/26/13 0525  NA 123* 122* 122* 120*  K 3.7 4.1 3.4* 4.3  CL 86* 85* 82* 85*  CO2 27 27 26 25   GLUCOSE 124* 131* 104* 115*  BUN 15 22 33* 37*  CREATININE 0.82 0.96 0.98 0.89  CALCIUM 10.0 10.2 10.5 10.1   Liver Function Tests: No results found for this basename: AST, ALT, ALKPHOS, BILITOT, PROT, ALBUMIN,  in the last 168 hours No results found for this basename: LIPASE, AMYLASE,  in the last 168 hours No results found for this basename: AMMONIA,  in the last 168 hours CBC:  Recent Labs Lab 02/23/13 1331 02/24/13 0629 02/25/13 0700  WBC 8.2 6.1 9.0  NEUTROABS 6.9  --   --   HGB 12.3 12.2 12.5  HCT 35.0* 35.5* 36.1  MCV 81.6 81.8 80.9  PLT 184 193 193   Cardiac Enzymes:  Recent Labs Lab 02/23/13 1717 02/24/13 0003 02/24/13 5284  TROPONINI <0.30 <0.30 <0.30   BNP (last 3 results)  Recent Labs  02/07/13 0424 02/23/13 1331 02/24/13 0629  PROBNP 803.5* 1602.0* 677.9*   CBG: No results found for this basename: GLUCAP,  in the last 168 hours  Recent Results (from the past 240 hour(s))  URINE CULTURE     Status: None   Collection Time    02/23/13  2:03 PM      Result Value Range Status   Specimen Description URINE, CATHETERIZED   Final   Special Requests NONE   Final   Culture  Setup Time 02/23/2013 21:01   Final   Colony Count >=100,000 COLONIES/ML   Final   Culture ESCHERICHIA COLI   Final   Report Status 02/25/2013 FINAL    Final   Organism ID, Bacteria ESCHERICHIA COLI   Final     Studies: No results found.  Scheduled Meds: . albuterol  2.5 mg Nebulization TID  . aspirin EC  81 mg Oral Daily  . cefTRIAXone (ROCEPHIN)  IV  1 g Intravenous Q24H  . clopidogrel  75 mg Oral Q breakfast  . ferrous sulfate  325 mg Oral Q breakfast  . furosemide  40 mg Intravenous Daily  . ipratropium  0.5 mg Nebulization TID  . isosorbide mononitrate  30 mg Oral Daily  . metoprolol tartrate  25 mg Oral BID  . mometasone-formoterol  2 puff Inhalation BID  . pantoprazole  40 mg Oral Daily  . potassium chloride  40 mEq Oral Daily  . predniSONE  40 mg Oral Q breakfast  . simvastatin  5 mg Oral q1800  . sodium chloride  3 mL Intravenous Q12H   Continuous Infusions:   Principal Problem:   Acute systolic heart failure Active Problems:   Peripheral arterial disease   Coronary artery disease   Asthma   Acute respiratory failure   Hyponatremia    Time spent: 35    [facility], [place]  [facility] Pager [phone]. If 7PM-7AM, please contact night-coverage at [email or web address], password [facility] 02/26/2013, 8:18 AM  LOS: 3 days

## 2013-02-27 DIAGNOSIS — J45909 Unspecified asthma, uncomplicated: Secondary | ICD-10-CM

## 2013-02-27 DIAGNOSIS — I5021 Acute systolic (congestive) heart failure: Secondary | ICD-10-CM

## 2013-02-27 LAB — BASIC METABOLIC PANEL
BUN: 30 mg/dL — ABNORMAL HIGH (ref 6–23)
CO2: 27 mEq/L (ref 19–32)
Chloride: 89 mEq/L — ABNORMAL LOW (ref 96–112)
Creatinine, Ser: 0.93 mg/dL (ref 0.50–1.10)
Glucose, Bld: 94 mg/dL (ref 70–99)

## 2013-02-27 NOTE — Progress Notes (Signed)
[facility] PROGRESS NOTE  [name] ZOX:[id number] DOB: 29-Nov-1925 DOA: 02/23/2013 PCP: [name], MD  Assessment/Plan: 1.sob -this is likely secondary to a combination of COPD/asthma exacerbation and acute systolic/diastolic heart failure. BNP is slightly elevated at >1600.  And is now 600- will change back to PO lasix but at a higher dose than home- may have over diuresed- weight down since admission She did respond to both modalities of treatment with nebulizer, steroids, IV Lasix in the ER-  cardiac markers negative.  She has known LAD occlusion. Continue with metoprolol 25 mg twice a day ? Compliance with diet at home  2. CAD-as described above in catheterization last done in 2012. Continuing long acting nitrates, beta blocker.   3. Hypertension-continue to monitor, medications   4. Hyperlipidemia-continue home therapy.   5. Peripheral arterial disease - previously described occluded fem-fem graft in description of catheterization in 2012. On clopidogrel 75 mg. Aspirin 325 mg. SCD's for DVT prophylaxsis as already on ASA/PLAVIX   6. Hyponatremia chronic ,baseline 129-130, start fluid restriction, check urine osmos and serum osmo. Probable siadh derived by resp failure  Urine osmolarity very high confirming siadh   7.  Hypokalemia- replete  8. ecoli UTI- present on admission- rocephin    Code Status: full Family Communication: patient at bedside Disposition Plan: SNF (daughter wants)   Consultants:  none  Procedures:  none  Antibiotics:    HPI/Subjective: Dyspnea is better   Objective: Filed Vitals:   02/27/13 0515 02/27/13 0810 02/27/13 0813 02/27/13 1019  BP: 107/46  111/61 115/74  Pulse: 65  72 80  Temp: 98.9 F (37.2 C)  99.1 F (37.3 C)   TempSrc: Oral  Oral   Resp: 18  18   Height:      Weight: 65.5 kg (144 lb 6.4 oz)     SpO2: 98% 95% 99%     Intake/Output Summary (Last 24 hours) at 02/27/13 1030 Last data filed at 02/27/13  1019  Gross per 24 hour  Intake   1143 ml  Output    700 ml  Net    443 ml   Filed Weights   02/25/13 0621 02/26/13 0409 02/27/13 0515  Weight: 67.949 kg (149 lb 12.8 oz) 67.7 kg (149 lb 4 oz) 65.5 kg (144 lb 6.4 oz)    Exam:   General:  Pleasant/cooperative  Cardiovascular: rrr  Respiratory: decreased BS b/l  Abdomen: +BS, soft, NT/ND  Musculoskeletal: moves all 4 ext, mild edema  Data Reviewed: Basic Metabolic Panel:  Recent Labs Lab 02/23/13 1331 02/24/13 0629 02/25/13 0700 02/26/13 0525 02/27/13 0615  NA 123* 122* 122* 120* 125*  K 3.7 4.1 3.4* 4.3 3.8  CL 86* 85* 82* 85* 89*  CO2 27 27 26 25 27   GLUCOSE 124* 131* 104* 115* 94  BUN 15 22 33* 37* 30*  CREATININE 0.82 0.96 0.98 0.89 0.93  CALCIUM 10.0 10.2 10.5 10.1 10.1   Liver Function Tests: No results found for this basename: AST, ALT, ALKPHOS, BILITOT, PROT, ALBUMIN,  in the last 168 hours No results found for this basename: LIPASE, AMYLASE,  in the last 168 hours No results found for this basename: AMMONIA,  in the last 168 hours CBC:  Recent Labs Lab 02/23/13 1331 02/24/13 0629 02/25/13 0700  WBC 8.2 6.1 9.0  NEUTROABS 6.9  --   --   HGB 12.3 12.2 12.5  HCT 35.0* 35.5* 36.1  MCV 81.6 81.8 80.9  PLT 184 193 193   Cardiac  Enzymes:  Recent Labs Lab 02/23/13 1717 02/24/13 0003 02/24/13 0629  TROPONINI <0.30 <0.30 <0.30   BNP (last 3 results)  Recent Labs  02/07/13 0424 02/23/13 1331 02/24/13 0629  PROBNP 803.5* 1602.0* 677.9*   CBG: No results found for this basename: GLUCAP,  in the last 168 hours  Recent Results (from the past 240 hour(s))  URINE CULTURE     Status: None   Collection Time    02/23/13  2:03 PM      Result Value Range Status   Specimen Description URINE, CATHETERIZED   Final   Special Requests NONE   Final   Culture  Setup Time 02/23/2013 21:01   Final   Colony Count >=100,000 COLONIES/ML   Final   Culture ESCHERICHIA COLI   Final   Report Status  02/25/2013 FINAL   Final   Organism ID, Bacteria ESCHERICHIA COLI   Final     Studies: No results found.  Scheduled Meds: . albuterol  2.5 mg Nebulization TID  . aspirin EC  81 mg Oral Daily  . cefTRIAXone (ROCEPHIN)  IV  1 g Intravenous Q24H  . clopidogrel  75 mg Oral Q breakfast  . ferrous sulfate  325 mg Oral Q breakfast  . furosemide  40 mg Oral Daily  . ipratropium  0.5 mg Nebulization TID  . isosorbide mononitrate  30 mg Oral Daily  . metoprolol tartrate  25 mg Oral BID  . mometasone-formoterol  2 puff Inhalation BID  . pantoprazole  40 mg Oral Daily  . potassium chloride  40 mEq Oral Daily  . predniSONE  40 mg Oral Q breakfast  . simvastatin  5 mg Oral q1800  . sodium chloride  3 mL Intravenous Q12H   Continuous Infusions:   Principal Problem:   Acute systolic heart failure Active Problems:   Peripheral arterial disease   Coronary artery disease   Asthma   Acute respiratory failure   Hyponatremia     [name]  [facility] Pager [phone]. If 7PM-7AM, please contact night-coverage at [email or web address], password [facility] 02/27/2013, 10:30 AM  LOS: 4 days

## 2013-02-28 LAB — BASIC METABOLIC PANEL
CO2: 26 mEq/L (ref 19–32)
Calcium: 10.2 mg/dL (ref 8.4–10.5)
Chloride: 88 mEq/L — ABNORMAL LOW (ref 96–112)
Creatinine, Ser: 0.91 mg/dL (ref 0.50–1.10)
Glucose, Bld: 100 mg/dL — ABNORMAL HIGH (ref 70–99)

## 2013-02-28 MED ORDER — ACETAMINOPHEN 325 MG PO TABS: 650.0000 mg | ORAL_TABLET | Freq: Four times a day (QID) | ORAL | Status: DC | PRN

## 2013-02-28 MED ORDER — ALBUTEROL SULFATE (5 MG/ML) 0.5% IN NEBU: 2.5000 mg | INHALATION_SOLUTION | Freq: Three times a day (TID) | RESPIRATORY_TRACT | Status: DC

## 2013-02-28 MED ORDER — PREDNISONE 20 MG PO TABS: 20.0000 mg | ORAL_TABLET | Freq: Every day | ORAL | Status: AC

## 2013-02-28 MED ORDER — IPRATROPIUM BROMIDE 0.02 % IN SOLN: 0.5000 mg | Freq: Two times a day (BID) | RESPIRATORY_TRACT | Status: DC

## 2013-02-28 MED ORDER — CEFUROXIME AXETIL 250 MG PO TABS: 250.0000 mg | ORAL_TABLET | Freq: Two times a day (BID) | ORAL | Status: AC

## 2013-02-28 MED ORDER — FUROSEMIDE 40 MG PO TABS: 40.0000 mg | ORAL_TABLET | Freq: Every day | ORAL | Status: DC

## 2013-02-28 MED ORDER — DIAZEPAM 2 MG PO TABS: 2.0000 mg | ORAL_TABLET | Freq: Every evening | ORAL | Status: DC | PRN

## 2013-02-28 MED ORDER — ALBUTEROL SULFATE (5 MG/ML) 0.5% IN NEBU: 2.5000 mg | INHALATION_SOLUTION | Freq: Two times a day (BID) | RESPIRATORY_TRACT | Status: DC

## 2013-02-28 NOTE — Clinical Social Work Placement (Signed)
Clinical Social Work Department CLINICAL SOCIAL WORK PLACEMENT NOTE 02/28/2013  Patient:  [name], [name]  Account Number:  [id number] Admit date:  02/23/2013  Clinical Social Worker:  Lupita Leash Tiffay Pinette, LCSWA  Date/time:  02/27/2013 05:15 AM  Clinical Social Work is seeking post-discharge placement for this patient at the following level of care:   SKILLED NURSING   (*CSW will update this form in Epic as items are completed)   02/27/2013  Patient/family provided with [facility] Department of Clinical Social Work's list of facilities offering this level of care within the geographic area requested by the patient (or if unable, by the patient's family).  02/27/2013  Patient/family informed of their freedom to choose among providers that offer the needed level of care, that participate in Medicare, Medicaid or managed care program needed by the patient, have an available bed and are willing to accept the patient.  02/27/2013  Patient/family informed of [facility]' ownership interest in [facility], as well as of the fact that they are under no obligation to receive care at this facility.  PASARR submitted to [employer] on 02/27/2013 PASARR number received from [employer] on 02/27/2013  FL2 transmitted to all facilities in geographic area requested by pt/family on  02/27/2013 FL2 transmitted to all facilities within larger geographic area on   Patient informed that his/her managed care company has contracts with or will negotiate with  certain facilities, including the following:   [employer]-- Clinicals sent to [place] at [facility]     Patient/family informed of bed offers received:   Patient chooses bed at  Physician recommends and patient chooses bed at    Patient to be transferred to  on   Patient to be transferred to facility by [facility]  The following physician request were entered in Epic:   Additional Comments:

## 2013-02-28 NOTE — Clinical Social Work Psychosocial (Addendum)
Clinical Social Work Department BRIEF PSYCHOSOCIAL ASSESSMENT 02/28/2013  Patient:  [name], [name]     Account Number:  [id number]     Admit date:  02/23/2013  Clinical Social Worker:  Tiburcio Pea  Date/Time:  02/27/2013 04:50 PM  Referred by:  Physician  Date Referred:  02/27/2013 Referred for  SNF Placement   Other Referral:   Interview type:  Other - See comment Other interview type:   patient and husband in room;  daughter Melissa via telephone    PSYCHOSOCIAL DATA Living Status:  HUSBAND Admitted from facility:   Level of care:   Primary support name:  Simar Pothier  [phone]  (C) Primary support relationship to patient:  SPOUSE Degree of support available:   Strong support  Daughter Quentin Angst  [phone]  Very involved    CURRENT CONCERNS Current Concerns  Post-Acute Placement   Other Concerns:    SOCIAL WORK ASSESSMENT / PLAN CSW met with patient and her husband this afternoon to discuss recommendation for short term SNF.  Patient's family had originally planned for d/c home with Home Health and have had this service in the past.  PT now recommends short term SNF.  Patient and husband are in agreement; she has been a resident of [facility] in the past . Agrees to search for all other facilities.  Patient/husband defer to daughter Efraim Kaufmann for follow up.  Fl2 completed and placed on shadow chart for MD's signature.   Assessment/plan status:  Psychosocial Support/Ongoing Assessment of Needs Other assessment/ plan:   Information/referral to community resources:   SNF bed list provided to patient and husband  After care needs discussed for possible HH/DME to be arranged by SNF after d/c from rehab.    PATIENT'S/FAMILY'S RESPONSE TO PLAN OF CARE: Patient is alert and appears oriented to person and place. She states that she is getting more "forgetful" and was, at times, unable to answer CSW's questions and looked to her husband for support and answers.   She is not fully oriented per nursing.  Patient's daughter had hoped to get an Advanced Directive completed during this admission but this CSW does not feel that she has orientation enough to complete the document.   CSW will monitor this and assist should her mentation improve.  Active bed search initiated.

## 2013-02-28 NOTE — Progress Notes (Signed)
Pt discharged by [facility] with husband and daughter to [facility] by wheelchair. Information packet sent with daughter.

## 2013-02-28 NOTE — Discharge Summary (Signed)
Physician Discharge Summary  [name] AVW:[id number] DOB: [birth year] DOA: 02/23/2013  PCP: [name], MD  Admit date: 02/23/2013 Discharge date: 02/28/2013  Time spent: 40 minutes  Recommendations for Outpatient Follow-up:  BMET CHECK 4/28  Discharge Diagnoses:    Acute ON CHRONIC systolic heart failure Chronic respiratory failure  E coli UTI    Peripheral arterial disease   Coronary artery disease with known LAD occlusion  Chronic peristsent  Asthma Chronic   Hyponatremia asymptomatic from SIADH   Discharge Condition: full  Diet recommendation: heart healthy   Filed Weights   02/26/13 0409 02/27/13 0515 02/28/13 0515  Weight: 67.7 kg (149 lb 4 oz) 65.5 kg (144 lb 6.4 oz) 67.9 kg (149 lb 11.1 oz)    History of present illness:  79 year old female with known coronary artery disease with LAD occlusion filled via collateral blood flow, last catheterization  02/03/2011 in the setting of unstable angina, who presented with increasing shortness of breath.She has a hx of COPD / CHF who was recently admitted to the hospital for SOB and found to be fluid overload. She ws diuresed and d/c home last week - since then she has gained 9 pounds and feeling more sOB over the last sveral days - family noted pt to be in moderate distress and requiring continuous home O2 secondary to sat's in the low 80's  Her left ventriculogram at the setting of catheterization demonstrated mild anterior wall hypokinesis but was overall felt to be in the EF 55% range.  She has a history of chronic COPD/asthma. Interestingly, never smoked. Chest x-ray demonstrated evidence of pulmonary congestion as well as cardiomegaly. She denies any chest pain currently but she does have chronic epigastric discomfort. No recent hematemesis, nausea, vomiting, diarrhea. Patient states that she was sleeping when she became short of breath which woke her from sleep. Patient admits to sleeping with 2 L oxygen via nasal  cannula at night.  Last echocardiogram shows anterior septal wall hypokinesis and her ejection fraction appears to be mildly to moderately reduced in the 40-50% range. BNP 1602      Hospital Course:  Dyspnea -this is likely secondary to a combination of COPD/asthma exacerbation and acute systolic/diastolic heart failure. ProBNP was slightly elevated at 1602 on admission.  She did respond to both modalities of treatment with nebulizer, steroids, IV Lasix in the ER- cardiac markers negative. She has known LAD occlusion. Continued on metoprolol 25 mg twice a day  2. CAD-as described above in catheterization last done in 2012. Continuing long acting nitrates, beta blocker.  3. Hypertension-good control on BB  4. Hyperlipidemia-continue home therapy.  5. Peripheral arterial disease - previously described occluded fem-fem graft in description of catheterization in 2012. On clopidogrel 75 mg. Aspirin 81 mg. 6. Hyponatremia chronic ,baseline 129-130, will need  fluid restriction, Probable siadh derived by resp failure  Urine osmolarity very high confirming siadh  7. Hypokalemia- repleted 8. ecoli UTI- present on admission- rocephin iv given in house , change dto po ceftin at DC      Discharge Exam: Filed Vitals:   02/27/13 2124 02/28/13 0515 02/28/13 0548 02/28/13 0845  BP: 118/67  111/72   Pulse: 79  82   Temp: 97.8 F (36.6 C)  97.6 F (36.4 C)   TempSrc: Oral  Oral   Resp: 18  18   Height:      Weight:  67.9 kg (149 lb 11.1 oz)    SpO2: 97%  97% 96%    General:  axox3 Cardiovascular: rrr Respiratory: no wheezes   Discharge Instructions  Discharge Orders   Future Appointments Provider Department Dept Phone   05/13/2013 3:45 PM Waymon Budge, MD [facility] [phone]   Future Orders Complete By Expires     Diet - low sodium heart healthy  As directed     Scheduling Instructions:      1200  Cc fluid restriction    Increase activity slowly  As directed          Medication List    STOP taking these medications       TYLENOL ALLERGY SINUS PO      TAKE these medications       acetaminophen 325 MG tablet  Commonly known as:  TYLENOL  Take 2 tablets (650 mg total) by mouth every 6 (six) hours as needed.     albuterol (5 MG/ML) 0.5% nebulizer solution  Commonly known as:  PROVENTIL  Take 0.5 mLs (2.5 mg total) by nebulization 3 (three) times daily.     albuterol 108 (90 BASE) MCG/ACT inhaler  Commonly known as:  PROVENTIL HFA;VENTOLIN HFA  Inhale 2 puffs into the lungs every 6 (six) hours as needed for wheezing.     aspirin 81 MG tablet  Take 81 mg by mouth daily.     cefUROXime 250 MG tablet  Commonly known as:  CEFTIN  Take 1 tablet (250 mg total) by mouth 2 (two) times daily.     clopidogrel 75 MG tablet  Commonly known as:  PLAVIX  Take 75 mg by mouth daily.     diazepam 2 MG tablet  Commonly known as:  VALIUM  Take 1 tablet (2 mg total) by mouth at bedtime as needed for anxiety or sleep.     ferrous sulfate 325 (65 FE) MG tablet  Take 325 mg by mouth daily with breakfast.     Fluticasone-Salmeterol 100-50 MCG/DOSE Aepb  Commonly known as:  ADVAIR DISKUS  1 puff then rise mouth, two times every day     furosemide 40 MG tablet  Commonly known as:  LASIX  Take 1 tablet (40 mg total) by mouth daily.     isosorbide mononitrate 30 MG 24 hr tablet  Commonly known as:  IMDUR  Take 30 mg by mouth daily.     lovastatin 20 MG tablet  Commonly known as:  MEVACOR  Take 20 mg by mouth every morning.     metoprolol tartrate 25 MG tablet  Commonly known as:  LOPRESSOR  Take 25 mg by mouth 2 (two) times daily.     nitroGLYCERIN 0.4 MG SL tablet  Commonly known as:  NITROSTAT  Place 0.4 mg under the tongue every 5 (five) minutes as needed.     pantoprazole 40 MG tablet  Commonly known as:  PROTONIX  Take 40 mg by mouth daily.     potassium chloride 20 MEQ packet  Commonly known as:  KLOR-CON  Take 20 mEq by mouth 2 (two)  times daily.     predniSONE 20 MG tablet  Commonly known as:  DELTASONE  Take 1 tablet (20 mg total) by mouth daily.     promethazine 12.5 MG tablet  Commonly known as:  PHENERGAN  Take 12.5 mg by mouth every 6 (six) hours as needed for nausea.     ranitidine 150 MG tablet  Commonly known as:  ZANTAC  Take 150 mg by mouth 2 (two) times daily.     tiotropium 18 MCG  inhalation capsule  Commonly known as:  SPIRIVA  Place 18 mcg into inhaler and inhale daily.     Vitamin D 1000 UNITS capsule  Take 1,000 Units by mouth daily.           Follow-up Information   Schedule an appointment as soon as possible for a visit with [name], MD.   Contact information:   [address], [place] [address] [phone]        The results of significant diagnostics from this hospitalization (including imaging, microbiology, ancillary and laboratory) are listed below for reference.    Significant Diagnostic Studies: Dg Chest 2 View  02/23/2013  *RADIOLOGY REPORT*  Clinical Data: Confusion.  Shortness of breath.  CHEST - 2 VIEW  Comparison: 02/07/2013.  Findings: The heart is enlarged but stable.  The lungs are clear. Stable eventration of the right hemidiaphragm.  Chronic underlying lung changes.  The bony thorax is grossly intact.  Remote mid thoracic compression fracture is noted.  L1 and L2 compression fractures are noted of uncertain age.  IMPRESSION: No acute cardiopulmonary findings.  Stable cardiac enlargement.   Original Report Authenticated By: Rudie Meyer, M.D.    Dg Chest Port 1 View  02/07/2013  *RADIOLOGY REPORT*  Clinical Data: Respiratory distress.  Shortness of breath.  PORTABLE CHEST - 1 VIEW  Comparison: 11/13/2012  Findings: Enlargement with increased pulmonary vascularity.  New interstitial changes suggesting interstitial edema.  No blunting of costophrenic angles.  No pneumothorax.  Calcified and tortuous  aorta.  IMPRESSION: Interval development of cardiac enlargement with pulmonary vascular congestion and interstitial edema.   Original Report Authenticated By: Burman Nieves, M.D.     Microbiology: Recent Results (from the past 240 hour(s))  URINE CULTURE     Status: None   Collection Time    02/23/13  2:03 PM      Result Value Range Status   Specimen Description URINE, CATHETERIZED   Final   Special Requests NONE   Final   Culture  Setup Time 02/23/2013 21:01   Final   Colony Count >=100,000 COLONIES/ML   Final   Culture ESCHERICHIA COLI   Final   Report Status 02/25/2013 FINAL   Final   Organism ID, Bacteria ESCHERICHIA COLI   Final     Labs: Basic Metabolic Panel:  Recent Labs Lab 02/24/13 0629 02/25/13 0700 02/26/13 0525 02/27/13 0615 02/28/13 0455  NA 122* 122* 120* 125* 124*  K 4.1 3.4* 4.3 3.8 3.8  CL 85* 82* 85* 89* 88*  CO2 27 26 25 27 26   GLUCOSE 131* 104* 115* 94 100*  BUN 22 33* 37* 30* 26*  CREATININE 0.96 0.98 0.89 0.93 0.91  CALCIUM 10.2 10.5 10.1 10.1 10.2   Liver Function Tests: No results found for this basename: AST, ALT, ALKPHOS, BILITOT, PROT, ALBUMIN,  in the last 168 hours No results found for this basename: LIPASE, AMYLASE,  in the last 168 hours No results found for this basename: AMMONIA,  in the last 168 hours CBC:  Recent Labs Lab 02/23/13 1331 02/24/13 0629 02/25/13 0700  WBC 8.2 6.1 9.0  NEUTROABS 6.9  --   --   HGB 12.3 12.2 12.5  HCT 35.0* 35.5* 36.1  MCV 81.6 81.8 80.9  PLT 184 193 193   Cardiac Enzymes:  Recent Labs Lab 02/23/13 1717 02/24/13  0003 02/24/13 0629  TROPONINI <0.30 <0.30 <0.30   BNP: BNP (last 3 results)  Recent Labs  02/07/13 0424 02/23/13 1331 02/24/13 0629  PROBNP 803.5* 1602.0* 677.9*   CBG: No results found for this basename: GLUCAP,  in the last 168 hours     Signed:  Abayomi Pattison  [facility] 02/28/2013, 9:13 AM

## 2013-02-28 NOTE — Plan of Care (Signed)
Problem: Discharge Progression Outcomes Goal: Able to perform self care activities Outcome: Progressing Pt discharged to [facility] for Rehab

## 2013-02-28 NOTE — Clinical Social Work Placement (Signed)
Clinical Social Work Department CLINICAL SOCIAL WORK PLACEMENT NOTE 02/28/2013  Patient:  [name], [name]  Account Number:  [id number] Admit date:  02/23/2013  Clinical Social Worker:  Lupita Leash Johathon Overturf, LCSWA  Date/time:  02/27/2013 05:15 AM  Clinical Social Work is seeking post-discharge placement for this patient at the following level of care:   SKILLED NURSING   (*CSW will update this form in Epic as items are completed)   02/27/2013  Patient/family provided with [facility] Department of Clinical Social Work's list of facilities offering this level of care within the geographic area requested by the patient (or if unable, by the patient's family).  02/27/2013  Patient/family informed of their freedom to choose among providers that offer the needed level of care, that participate in Medicare, Medicaid or managed care program needed by the patient, have an available bed and are willing to accept the patient.  02/27/2013  Patient/family informed of [facility]' ownership interest in [facility], as well as of the fact that they are under no obligation to receive care at this facility.  PASARR submitted to EDS on 02/27/2013 PASARR number received from [employer] on 02/27/2013  FL2 transmitted to all facilities in geographic area requested by pt/family on  02/27/2013 FL2 transmitted to all facilities within larger geographic area on   Patient informed that his/her managed care company has contracts with or will negotiate with  certain facilities, including the following:   [employer]-- Clinicals sent to [place] at [facility]     Patient/family informed of bed offers received:  02/27/13 Patient chooses bed at [facility] Physician recommends and patient chooses bed at    Patient to be transferred to [facility]  on  02/28/13 Patient to be transferred to facility by [facility]  Sharin Mons)  The following physician request were entered in Epic:   Additional Comments: Ok per MD for d/c  to SNF on 02/28/13.  [facility] obtained [facility] authorization for admission. Notified facility, patient, husband and her nurse Juanita of d/c.  Husband will sign admit papers at SNF. No further CSW needs identified.  CSW signing off.  Lorri Frederick. Sheri Prows,LCSWA  [phone]

## 2013-04-25 IMAGING — CR DG CHEST 2V
2 series · 2 of 2 positions shown · non-contrast
Comparison: The CT 07/17/2011, chest radiograph 07/17/2011

CLINICAL DATA: Short of breath

CHEST - 2 VIEW

[w chest pa]
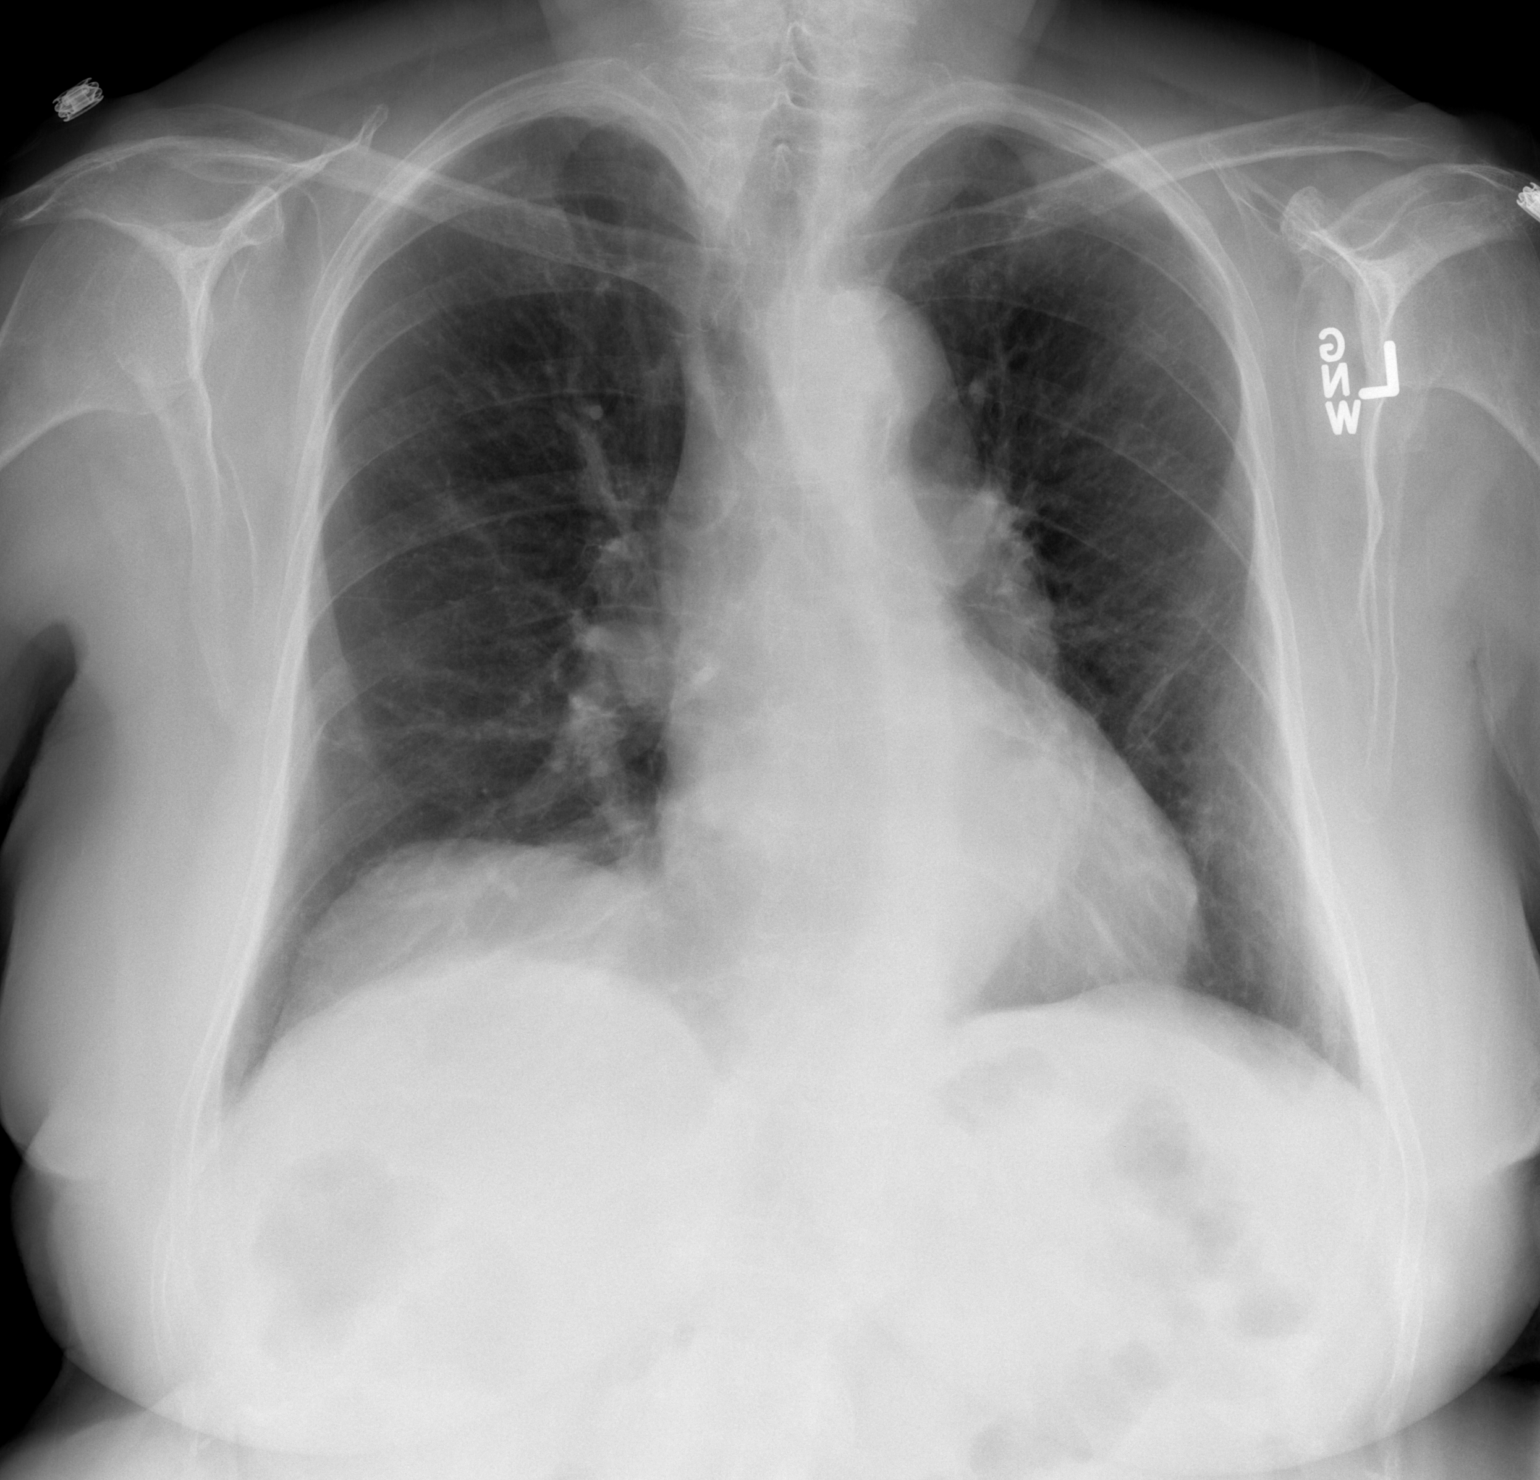

[w chest lat]
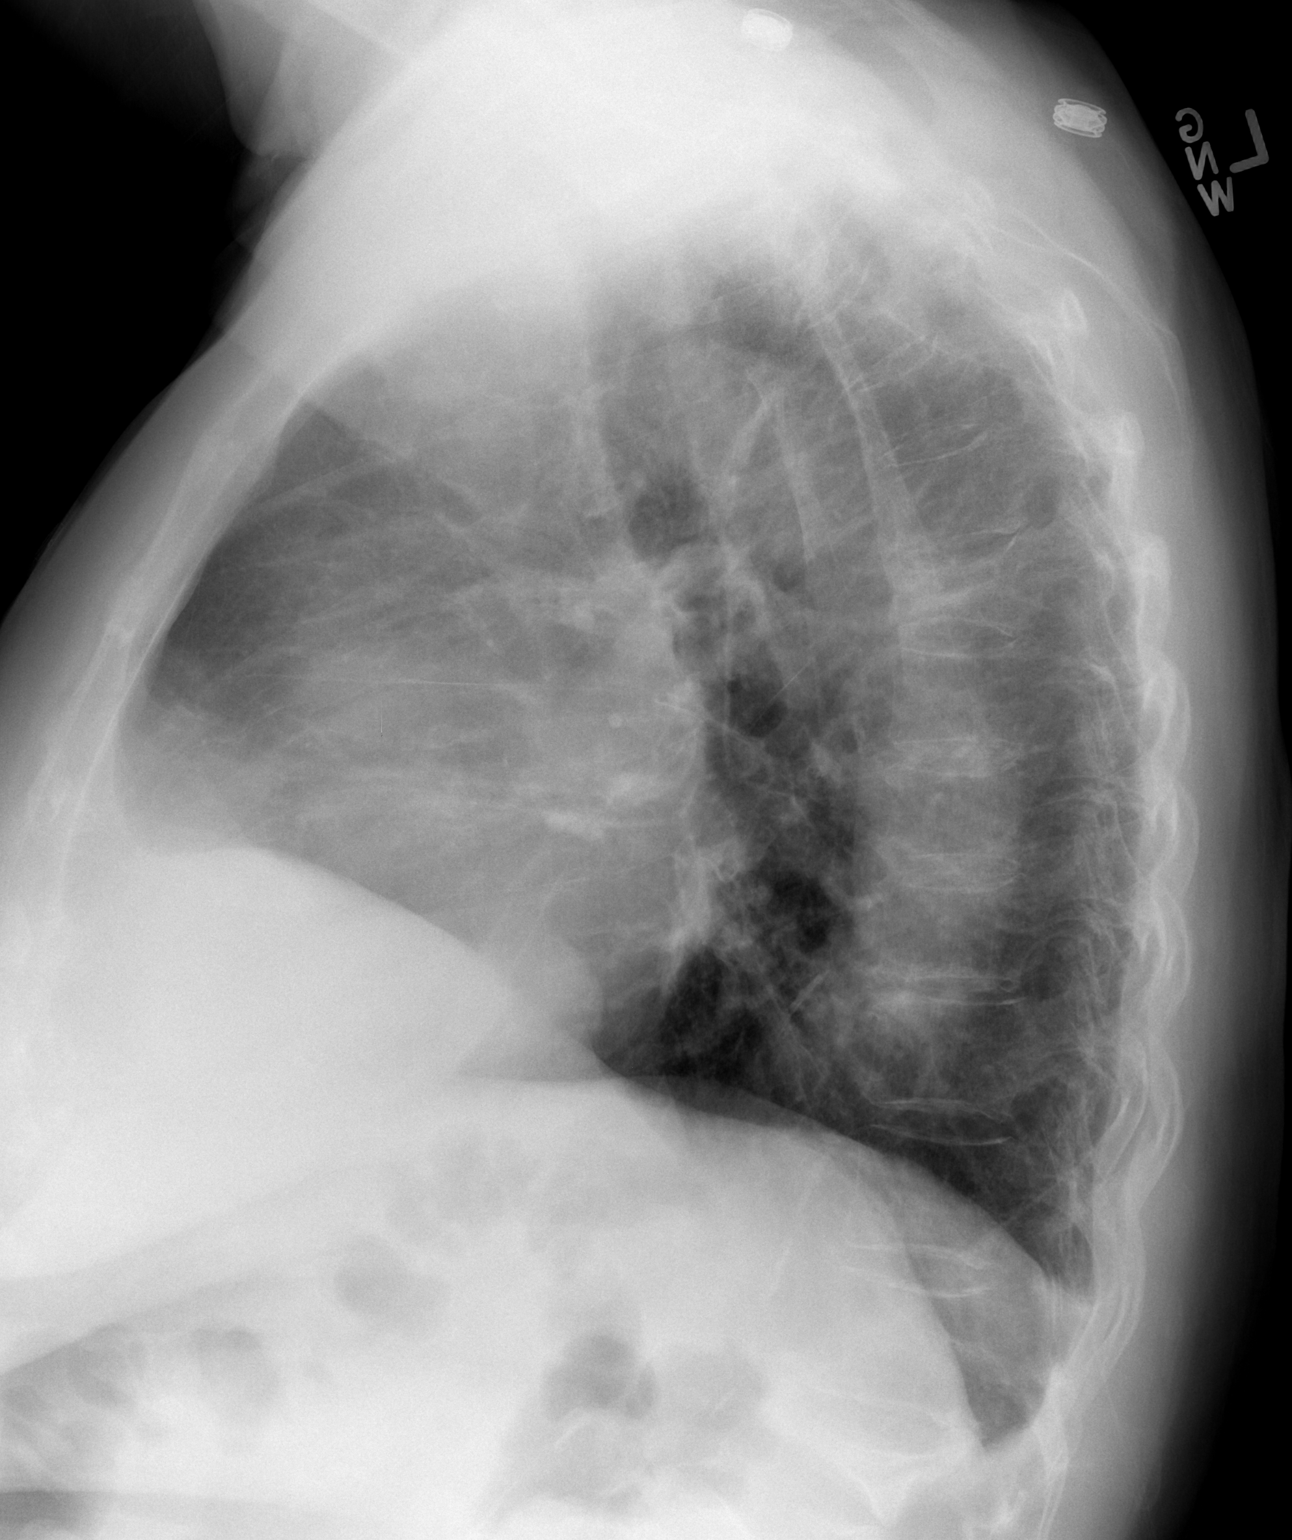

[2 of 2 positions shown; findings below may reference images not displayed]

FINDINGS: Stable mediastinum and cardiac silhouette with ectatic
aorta.  No effusion, infiltrate, or pneumothorax.  Compression
fracture of the upper thoracic spine are stable.
IMPRESSION: 1.  No acute cardiopulmonary process.
2.  Compression fractures of thoracic spine or chronic.

## 2013-05-13 ENCOUNTER — Encounter: Payer: Self-pay | Admitting: Internal Medicine

## 2013-05-13 ENCOUNTER — Ambulatory Visit (INDEPENDENT_AMBULATORY_CARE_PROVIDER_SITE_OTHER): Payer: Medicare HMO | Admitting: Internal Medicine

## 2013-05-13 VITALS — BP 124/78 | HR 100 | Ht 62.0 in | Wt 151.2 lb

## 2013-05-13 DIAGNOSIS — R0989 Other specified symptoms and signs involving the circulatory and respiratory systems: Secondary | ICD-10-CM

## 2013-05-13 NOTE — Progress Notes (Signed)
06/01/12- 48 yoF never smoker referred courtesy of Dr. Donette Larry with  concern of shortness of breath and wheezing.  Daughter is here.   PCP Dr. Danise Edge. She had seen Dr. Shelle Iron years ago for shortness of breath but "got used to it". Remote diagnosis of asthma described as mild and treated with rescue inhaler about once a day. Gradually worse dyspnea on exertion now room to room over the past 6-10 months. Spells of tickling cough, dry. Wheezes a lot. Okay at night while supine and sitting. Some history of pollen rhinitis and headache. Pneumonia as a child and again in 2012. Fell in 2012 sustaining vertebral fractures. Cardiac cath 2012 positive for coronary disease/ angioplasty without stents. History of femoral bypass surgery. Pernicious anemia. GERD. Father and brothers had emphysema;  mother had heart disease. 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  07/12/12- 89 yoF never smoker referred courtesy of Dr. Donette Larry with  concern of shortness of breath and wheezing.  Daughter is here.   PCP Dr. Danise Edge Go over walk results and PFT-- reports breathing is "about the same" Occasional cough with scant sputum. Does not change with weather and feels stable. Her daughter says that she does not snore or stop breathing in her sleep. Declines flu vaccine. CXR 06/05/12-  IMPRESSION:  1. No acute cardiopulmonary abnormalities.  2. Left lung scarring.  Original Report Authenticated By: Rosealee Albee, M.D.  PFT 06/19/2012: Mild obstructive airways disease with response to bronchodilator, air trapping. Diffusion severely reduced. FEV1 1.18/90% FEV1/FVC 0.71 FEF 25-75% 0.74/46% RV 131%, DLCO 22%. 6MWT-91%, 90%, 91%. Stopped at 2 minutes and 58 seconds due to shortness of breath with saturation 87% then went on to a total of 172 m.  11/13/12- 86 yoF never smoker referred courtesy of Dr. Donette Larry with  concern of shortness of breath and wheezing    Daughter here FOLLOWS FOR: states breathing is little better but gives out quickly(some of this is due to knee) O2 levels drop when walking or moving around [place] 07/16/12- +qual for sleep O2 O2 for sleep -2L/ APS. She doesn't always bother to put it on. Daughter says she is better in the mornings when she has worn at for the night. The patient thinks her dyspnea on exertion is better if she skips the oxygen. She blames postnasal drip for her cough and reports much less wheeze.  05/13/13-86 yoF never smoker referred courtesy of Dr. Donette Larry with  concern of shortness of breath and wheezing   Husband is here. FOLLOWS FOR: pt reports breathing is better-- denies any other concerns at this time Limited by knee pain. Not using oxygen/APS and says saturation stays 98%. CXR 02/23/13 IMPRESSION:  No acute cardiopulmonary findings. Stable cardiac enlargement.  Original Report Authenticated By: Rudie Meyer, M.D.   ROS-see HPI Constitutional:   No-   weight loss, night sweats, fevers, chills, fatigue, lassitude. HEENT:   +  headaches, difficulty swallowing, tooth/dental problems, sore throat,       No-  sneezing, No-itching, ear ache, nasal congestion, no-post nasal drip,  CV:  No-   chest pain, orthopnea, PND, swelling in lower extremities, anasarca, dizziness, palpitations Resp: + shortness of breath with exertion or at rest.              No-   productive cough,  + non-productive cough,  No- coughing up of blood.  No-   change in color of mucus.  No- wheezing.   Skin: No-   rash or lesions. GI:  No-   heartburn, indigestion, abdominal pain, nausea, vomiting,  GU:  MS:  +  joint pain or swelling.   Neuro-     nothing unusual Psych:  No- change in mood or affect. No depression or anxiety.  No memory loss.  OBJ- Physical Exam General- Alert, Oriented, Affect-appropriate, Distress- none acute, short/ stocky Skin- rash-none, lesions- none, excoriation- none Lymphadenopathy- none Head-  atraumatic            Eyes- Gross vision intact, PERRLA, conjunctivae and secretions clear            Ears- Hearing, canals-normal            Nose- Clear, no-Septal dev, mucus, polyps, erosion, perforation             Throat- Mallampati II , mucosa clear , drainage- none, tonsils- atrophic Neck- flexible , trachea midline, no stridor , thyroid nl, carotid no bruit Chest - symmetrical excursion , unlabored           Heart/CV- RRR , no murmur , no gallop  , no rub, nl s1 s2                           - JVD- none , edema- none, stasis changes- none, varices- none, +diminished pedal pulses           Lung- + bilateral crackles in bases, shallow inspiratory effort, wheeze- none, + occasional cough , dullness-none, rub- none           Chest wall-  Abd-  Br/ Gen/ Rectal- Not done, not indicated Extrem- cyanosis- none, clubbing, none, atrophy- none, strength- nl Neuro- grossly intact to observation

## 2013-05-13 NOTE — Patient Instructions (Addendum)
Work with your other doctors to keep your heart controlled  Use your O2 2L at night/ when needed   We will watch over time to see how much you are needing it.   Please call as needed

## 2013-05-26 NOTE — Assessment & Plan Note (Signed)
She is not recognizing oxygen desaturation. Activity is limited by arthritis in her knees. Dyspnea reflects cardiac disease more than pulmonary limitations.

## 2013-07-04 ENCOUNTER — Inpatient Hospital Stay (HOSPITAL_COMMUNITY)
Admission: EM | Admit: 2013-07-04 | Discharge: 2013-07-06 | DRG: 280 | Disposition: A | Discharge status: Home Health | Payer: Medicare HMO | Attending: Interventional Cardiology | Admitting: Interventional Cardiology

## 2013-07-04 ENCOUNTER — Other Ambulatory Visit: Payer: Self-pay

## 2013-07-04 ENCOUNTER — Encounter (HOSPITAL_COMMUNITY): Payer: Self-pay

## 2013-07-04 ENCOUNTER — Emergency Department (HOSPITAL_COMMUNITY): Payer: Medicare HMO

## 2013-07-04 DIAGNOSIS — I509 Heart failure, unspecified: Secondary | ICD-10-CM

## 2013-07-04 DIAGNOSIS — Z79899 Other long term (current) drug therapy: Secondary | ICD-10-CM

## 2013-07-04 DIAGNOSIS — J9600 Acute respiratory failure, unspecified whether with hypoxia or hypercapnia: Secondary | ICD-10-CM

## 2013-07-04 DIAGNOSIS — I739 Peripheral vascular disease, unspecified: Secondary | ICD-10-CM | POA: Diagnosis present

## 2013-07-04 DIAGNOSIS — I10 Essential (primary) hypertension: Secondary | ICD-10-CM | POA: Diagnosis present

## 2013-07-04 DIAGNOSIS — Z9981 Dependence on supplemental oxygen: Secondary | ICD-10-CM

## 2013-07-04 DIAGNOSIS — I214 Non-ST elevation (NSTEMI) myocardial infarction: Principal | ICD-10-CM | POA: Diagnosis present

## 2013-07-04 DIAGNOSIS — J45909 Unspecified asthma, uncomplicated: Secondary | ICD-10-CM

## 2013-07-04 DIAGNOSIS — Z7982 Long term (current) use of aspirin: Secondary | ICD-10-CM

## 2013-07-04 DIAGNOSIS — K219 Gastro-esophageal reflux disease without esophagitis: Secondary | ICD-10-CM | POA: Diagnosis present

## 2013-07-04 DIAGNOSIS — E785 Hyperlipidemia, unspecified: Secondary | ICD-10-CM | POA: Diagnosis present

## 2013-07-04 DIAGNOSIS — N179 Acute kidney failure, unspecified: Secondary | ICD-10-CM | POA: Diagnosis present

## 2013-07-04 DIAGNOSIS — Z7902 Long term (current) use of antithrombotics/antiplatelets: Secondary | ICD-10-CM

## 2013-07-04 DIAGNOSIS — J438 Other emphysema: Secondary | ICD-10-CM | POA: Diagnosis present

## 2013-07-04 DIAGNOSIS — I5033 Acute on chronic diastolic (congestive) heart failure: Secondary | ICD-10-CM | POA: Diagnosis present

## 2013-07-04 DIAGNOSIS — I2510 Atherosclerotic heart disease of native coronary artery without angina pectoris: Secondary | ICD-10-CM | POA: Diagnosis present

## 2013-07-04 DIAGNOSIS — J449 Chronic obstructive pulmonary disease, unspecified: Secondary | ICD-10-CM | POA: Diagnosis present

## 2013-07-04 DIAGNOSIS — Z86718 Personal history of other venous thrombosis and embolism: Secondary | ICD-10-CM

## 2013-07-04 DIAGNOSIS — I5021 Acute systolic (congestive) heart failure: Secondary | ICD-10-CM

## 2013-07-04 HISTORY — DX: Vitamin B12 deficiency anemia due to intrinsic factor deficiency: D51.0

## 2013-07-04 HISTORY — DX: Anxiety disorder, unspecified: F41.9

## 2013-07-04 HISTORY — DX: Other chronic cystitis without hematuria: N30.20

## 2013-07-04 HISTORY — DX: Acute myocardial infarction, unspecified: I21.9

## 2013-07-04 HISTORY — DX: Personal history of other medical treatment: Z92.89

## 2013-07-04 HISTORY — DX: Headache: R51

## 2013-07-04 HISTORY — DX: Unspecified chronic bronchitis: J42

## 2013-07-04 HISTORY — DX: Pneumonia, unspecified organism: J18.9

## 2013-07-04 HISTORY — DX: Unspecified osteoarthritis, unspecified site: M19.90

## 2013-07-04 HISTORY — DX: Wedge compression fracture of unspecified lumbar vertebra, initial encounter for closed fracture: S32.000A

## 2013-07-04 HISTORY — DX: Benign neoplasm of cranial nerves: D33.3

## 2013-07-04 HISTORY — DX: Unspecified hearing loss, right ear: H91.91

## 2013-07-04 HISTORY — DX: Chronic obstructive pulmonary disease, unspecified: J44.9

## 2013-07-04 LAB — CBC
HCT: 39.5 % (ref 36.0–46.0)
HCT: 40.6 % (ref 36.0–46.0)
Hemoglobin: 13.1 g/dL (ref 12.0–15.0)
Hemoglobin: 13.4 g/dL (ref 12.0–15.0)
MCH: 28.7 pg (ref 26.0–34.0)
MCH: 28.9 pg (ref 26.0–34.0)
MCHC: 33 g/dL (ref 30.0–36.0)
MCHC: 33.2 g/dL (ref 30.0–36.0)
MCV: 86.4 fL (ref 78.0–100.0)
RBC: 4.63 MIL/uL (ref 3.87–5.11)
RDW: 13.5 % (ref 11.5–15.5)

## 2013-07-04 LAB — BASIC METABOLIC PANEL
BUN: 22 mg/dL (ref 6–23)
CO2: 19 mEq/L (ref 19–32)
GFR calc non Af Amer: 44 mL/min — ABNORMAL LOW (ref >90)
Glucose, Bld: 141 mg/dL — ABNORMAL HIGH (ref 70–99)
Potassium: 4.5 mEq/L (ref 3.5–5.1)

## 2013-07-04 LAB — COMPREHENSIVE METABOLIC PANEL
CO2: 26 mEq/L (ref 19–32)
Calcium: 10.3 mg/dL (ref 8.4–10.5)
Creatinine, Ser: 1.12 mg/dL — ABNORMAL HIGH (ref 0.50–1.10)
GFR calc Af Amer: 50 mL/min — ABNORMAL LOW (ref >90)
GFR calc non Af Amer: 44 mL/min — ABNORMAL LOW (ref >90)
Glucose, Bld: 100 mg/dL — ABNORMAL HIGH (ref 70–99)

## 2013-07-04 LAB — TROPONIN I
Troponin I: 1.22 ng/mL (ref <0.30)
Troponin I: 0.84 ng/mL (ref <0.30)

## 2013-07-04 LAB — HEMOGLOBIN A1C
Hgb A1c MFr Bld: 5.3 % (ref <5.7)
Mean Plasma Glucose: 105 mg/dL (ref <117)

## 2013-07-04 LAB — HEPARIN LEVEL (UNFRACTIONATED): Heparin Unfractionated: 0.57 IU/mL (ref 0.30–0.70)

## 2013-07-04 LAB — PROTIME-INR
INR: 1.05 (ref 0.00–1.49)
Prothrombin Time: 13.5 seconds (ref 11.6–15.2)

## 2013-07-04 LAB — POCT I-STAT TROPONIN I

## 2013-07-04 MED ORDER — SODIUM CHLORIDE 0.9 % IJ SOLN: 3.0000 mL | Freq: Two times a day (BID) | INTRAMUSCULAR | Status: DC

## 2013-07-04 MED ORDER — FUROSEMIDE 10 MG/ML IJ SOLN
40.0000 mg | Freq: Once | INTRAMUSCULAR | Status: AC
  Administered 2013-07-04: 11:00:00 40 mg via INTRAVENOUS
  Filled 2013-07-04: qty 4

## 2013-07-04 MED ORDER — ALBUTEROL SULFATE (5 MG/ML) 0.5% IN NEBU: 2.5000 mg | INHALATION_SOLUTION | RESPIRATORY_TRACT | Status: AC | PRN

## 2013-07-04 MED ORDER — NITROGLYCERIN 0.3 MG/HR TD PT24
0.3000 mg | MEDICATED_PATCH | Freq: Every day | TRANSDERMAL | Status: DC
  Administered 2013-07-04 – 2013-07-05 (×2): 0.3 mg via TRANSDERMAL
  Filled 2013-07-04 (×2): qty 1

## 2013-07-04 MED ORDER — NITROGLYCERIN 0.4 MG SL SUBL: 0.4000 mg | SUBLINGUAL_TABLET | SUBLINGUAL | Status: DC | PRN

## 2013-07-04 MED ORDER — POTASSIUM CHLORIDE CRYS ER 20 MEQ PO TBCR
20.0000 meq | EXTENDED_RELEASE_TABLET | Freq: Two times a day (BID) | ORAL | Status: DC
  Administered 2013-07-04 – 2013-07-06 (×5): 20 meq via ORAL
  Filled 2013-07-04 (×6): qty 1

## 2013-07-04 MED ORDER — POTASSIUM CHLORIDE 20 MEQ PO PACK: 20.0000 meq | PACK | Freq: Two times a day (BID) | ORAL | Status: DC

## 2013-07-04 MED ORDER — IPRATROPIUM BROMIDE 0.02 % IN SOLN
0.5000 mg | Freq: Once | RESPIRATORY_TRACT | Status: AC
  Administered 2013-07-04: 0.5 mg via RESPIRATORY_TRACT
  Filled 2013-07-04: qty 2.5

## 2013-07-04 MED ORDER — ALBUTEROL SULFATE (5 MG/ML) 0.5% IN NEBU
2.5000 mg | INHALATION_SOLUTION | Freq: Once | RESPIRATORY_TRACT | Status: AC
  Administered 2013-07-04: 2.5 mg via RESPIRATORY_TRACT
  Filled 2013-07-04: qty 0.5

## 2013-07-04 MED ORDER — MOMETASONE FURO-FORMOTEROL FUM 100-5 MCG/ACT IN AERO
2.0000 | INHALATION_SPRAY | Freq: Two times a day (BID) | RESPIRATORY_TRACT | Status: DC
  Administered 2013-07-04 – 2013-07-06 (×5): 2 via RESPIRATORY_TRACT
  Filled 2013-07-04: qty 8.8

## 2013-07-04 MED ORDER — SODIUM CHLORIDE 0.9 % IV SOLN: 250.0000 mL | INTRAVENOUS | Status: DC | PRN

## 2013-07-04 MED ORDER — HEPARIN BOLUS VIA INFUSION
3000.0000 [IU] | Freq: Once | INTRAVENOUS | Status: AC
  Administered 2013-07-04: 3000 [IU] via INTRAVENOUS

## 2013-07-04 MED ORDER — ASPIRIN 81 MG PO CHEW
324.0000 mg | CHEWABLE_TABLET | Freq: Once | ORAL | Status: AC
  Administered 2013-07-04: 324 mg via ORAL
  Filled 2013-07-04: qty 4

## 2013-07-04 MED ORDER — FERROUS SULFATE 325 (65 FE) MG PO TABS
325.0000 mg | ORAL_TABLET | Freq: Every day | ORAL | Status: DC
  Administered 2013-07-04 – 2013-07-06 (×3): 325 mg via ORAL
  Filled 2013-07-04 (×5): qty 1

## 2013-07-04 MED ORDER — SIMVASTATIN 20 MG PO TABS
20.0000 mg | ORAL_TABLET | Freq: Every day | ORAL | Status: DC
  Administered 2013-07-04 – 2013-07-05 (×2): 20 mg via ORAL
  Filled 2013-07-04 (×3): qty 1

## 2013-07-04 MED ORDER — TIOTROPIUM BROMIDE MONOHYDRATE 18 MCG IN CAPS
18.0000 ug | ORAL_CAPSULE | Freq: Every day | RESPIRATORY_TRACT | Status: DC
  Administered 2013-07-04 – 2013-07-06 (×3): 18 ug via RESPIRATORY_TRACT
  Filled 2013-07-04: qty 5

## 2013-07-04 MED ORDER — VITAMIN D3 25 MCG (1000 UNIT) PO TABS
1000.0000 [IU] | ORAL_TABLET | Freq: Every day | ORAL | Status: DC
  Administered 2013-07-04 – 2013-07-06 (×3): 1000 [IU] via ORAL
  Filled 2013-07-04 (×4): qty 1

## 2013-07-04 MED ORDER — METOPROLOL TARTRATE 25 MG PO TABS
25.0000 mg | ORAL_TABLET | Freq: Two times a day (BID) | ORAL | Status: DC
  Administered 2013-07-04 – 2013-07-06 (×5): 25 mg via ORAL
  Filled 2013-07-04 (×6): qty 1

## 2013-07-04 MED ORDER — ISOSORBIDE MONONITRATE ER 30 MG PO TB24
30.0000 mg | ORAL_TABLET | Freq: Every day | ORAL | Status: DC
  Administered 2013-07-04 – 2013-07-05 (×2): 30 mg via ORAL
  Filled 2013-07-04 (×3): qty 1

## 2013-07-04 MED ORDER — ACETAMINOPHEN 325 MG PO TABS: 650.0000 mg | ORAL_TABLET | Freq: Four times a day (QID) | ORAL | Status: DC | PRN

## 2013-07-04 MED ORDER — HEPARIN (PORCINE) IN NACL 100-0.45 UNIT/ML-% IJ SOLN
1000.0000 [IU]/h | INTRAMUSCULAR | Status: AC
  Administered 2013-07-04: 1000 [IU]/h via INTRAVENOUS
  Filled 2013-07-04 (×3): qty 250

## 2013-07-04 MED ORDER — SODIUM CHLORIDE 0.9 % IJ SOLN: 3.0000 mL | INTRAMUSCULAR | Status: DC | PRN

## 2013-07-04 MED ORDER — CLOPIDOGREL BISULFATE 75 MG PO TABS
75.0000 mg | ORAL_TABLET | Freq: Every day | ORAL | Status: DC
  Administered 2013-07-04 – 2013-07-06 (×3): 75 mg via ORAL
  Filled 2013-07-04 (×3): qty 1

## 2013-07-04 MED ORDER — DEXTROSE 5 % IV SOLN
500.0000 mg | Freq: Once | INTRAVENOUS | Status: AC
  Administered 2013-07-04: 500 mg via INTRAVENOUS
  Filled 2013-07-04: qty 500

## 2013-07-04 MED ORDER — ASPIRIN EC 81 MG PO TBEC
81.0000 mg | DELAYED_RELEASE_TABLET | Freq: Every day | ORAL | Status: DC
  Administered 2013-07-04 – 2013-07-06 (×3): 81 mg via ORAL
  Filled 2013-07-04 (×4): qty 1

## 2013-07-04 MED ORDER — FAMOTIDINE 20 MG PO TABS
20.0000 mg | ORAL_TABLET | Freq: Every day | ORAL | Status: DC
  Administered 2013-07-04 – 2013-07-06 (×3): 20 mg via ORAL
  Filled 2013-07-04 (×4): qty 1

## 2013-07-04 MED ORDER — FUROSEMIDE 40 MG PO TABS
40.0000 mg | ORAL_TABLET | Freq: Every day | ORAL | Status: DC
  Administered 2013-07-05: 11:00:00 40 mg via ORAL
  Filled 2013-07-04 (×3): qty 1

## 2013-07-04 MED ORDER — TRAZODONE 25 MG HALF TABLET
25.0000 mg | ORAL_TABLET | Freq: Once | ORAL | Status: AC
  Administered 2013-07-04: 22:00:00 25 mg via ORAL
  Filled 2013-07-04: qty 1

## 2013-07-04 MED ORDER — ACETAMINOPHEN 325 MG PO TABS
650.0000 mg | ORAL_TABLET | ORAL | Status: DC | PRN
  Administered 2013-07-04 – 2013-07-05 (×2): 650 mg via ORAL
  Filled 2013-07-04: qty 2

## 2013-07-04 MED ORDER — ASPIRIN EC 81 MG PO TBEC: 81.0000 mg | DELAYED_RELEASE_TABLET | Freq: Every day | ORAL | Status: DC

## 2013-07-04 MED ORDER — DEXTROSE 5 % IV SOLN
1.0000 g | Freq: Once | INTRAVENOUS | Status: AC
  Administered 2013-07-04: 1 g via INTRAVENOUS
  Filled 2013-07-04: qty 10

## 2013-07-04 MED ORDER — PANTOPRAZOLE SODIUM 40 MG PO TBEC
40.0000 mg | DELAYED_RELEASE_TABLET | Freq: Every day | ORAL | Status: DC
  Administered 2013-07-04 – 2013-07-06 (×3): 40 mg via ORAL
  Filled 2013-07-04 (×2): qty 1

## 2013-07-04 MED ORDER — ONDANSETRON HCL 4 MG/2ML IJ SOLN: 4.0000 mg | Freq: Four times a day (QID) | INTRAMUSCULAR | Status: DC | PRN

## 2013-07-04 MED ORDER — LEVOFLOXACIN IN D5W 750 MG/150ML IV SOLN
750.0000 mg | INTRAVENOUS | Status: DC
  Administered 2013-07-04: 750 mg via INTRAVENOUS
  Filled 2013-07-04 (×3): qty 150

## 2013-07-04 MED ORDER — FUROSEMIDE 10 MG/ML IJ SOLN
40.0000 mg | Freq: Once | INTRAMUSCULAR | Status: AC
  Administered 2013-07-04: 40 mg via INTRAVENOUS
  Filled 2013-07-04 (×2): qty 4

## 2013-07-04 NOTE — Progress Notes (Signed)
ANTICOAGULATION CONSULT NOTE - Initial Consult  Pharmacy Consult for heparin  Indication: nstemi  Allergies  Allergen Reactions  . Sulfa Antibiotics Other (See Comments)    unknown    Patient Measurements:   Weight: 68kg   Vital Signs: Temp: 98 F (36.7 C) (08/28 0016) Temp src: Axillary (08/28 0016) BP: 123/62 mmHg (08/28 0200) Pulse Rate: 74 (08/28 0200)  Labs:  Recent Labs  07/04/13 0019 07/04/13 0100  HGB 13.4  --   HCT 40.6  --   PLT 240  --   CREATININE 1.12*  --   TROPONINI  --  1.22*    The CrCl is unknown because both a height and weight (above a minimum accepted value) are required for this calculation.   Medical History: Past Medical History  Diagnosis Date  . DVT (deep venous thrombosis)   . Anemia   . Back injury   . Acid reflux   . Hypertension   . Hypercholesterolemia   . Anginal pain   . Asthma   . Hyponatremia   . Congestive heart failure     Medications:   (Not in a hospital admission)  Assessment: 79 yo with increasing sob. Troponin results at 1.22. No oral anticoagulants on PTA meds.  Goal of Therapy:  Heparin level 0.3-0.7 units/ml Monitor platelets by anticoagulation protocol: Yes   Plan:  Heparin 3000 unitsx1 then 1000 units/hr 8 hour HL  Daily HL and cbc to start 8/29    Janice Coffin 07/04/2013,2:27 AM

## 2013-07-04 NOTE — ED Notes (Signed)
MD at bedside. 

## 2013-07-04 NOTE — Progress Notes (Signed)
Utilization Review Completed Adream Parzych J. Ivey Cina, RN, BSN, NCM [phone]  

## 2013-07-04 NOTE — Progress Notes (Signed)
RT switched patient over from EMS CPAP 5cm to V60 CPAP of 5cm. Dr. Margaret Pyle order to start BiPAP and then request to see how patient tolerated 2L [place]. Pt removed from CPAP at that time and tolerating [place] fine. HR: 81 RR 22 SATS 99%.

## 2013-07-04 NOTE — ED Notes (Signed)
Family at bedside. 

## 2013-07-04 NOTE — ED Notes (Signed)
Patient presents from home with c/o increased difficulty with breathing since around 6 pm. Patient used albuterol inhaler and increased home O2 from 2L to 4L. At EMS arrival, patient noted with SPO2 89% on 4L. Labored respirations, dyspnea, tachypnea, tripod positioning, abdominal retractions and only able to speak 2 words at at time. Patient placed on CPAP @ 5 PEEP. SPO2 improved to 98%. BP 154/96. 12 lead unremarkable. Patient breathing better. Hx COPD, HTN, CHF, dyslipidemia.

## 2013-07-04 NOTE — Progress Notes (Addendum)
Patient Name: [name] Date of Encounter: 07/04/2013    SUBJECTIVE: This is a pleasant 79 year old patient of Dr. Hoyle Barr admitted early this morning. Missed communication of her admission.  She is now pain free. In speaking with the family she has not felt well since Monday when she had an MRI of her head. Last evening she began having chest, epigastric, warm, and jaw discomfort. Prior to discomfort she noted shortness of breath and the pain gradually building. She was brought into the emergency room by the EMS service because of severe shortness of breath and chest pain.  Therapy initiated in the emergency room, predominantly diuresis, left dramatic improvement in chest discomfort and a gradual reduction in dyspnea. She now feels close to normal.  TELEMETRY:  Normal sinus rhythm: Filed Vitals:   07/04/13 0315 07/04/13 0353 07/04/13 0809 07/04/13 0842  BP: 105/71 120/57  123/59  Pulse: 88 91  93  Temp:  98.1 F (36.7 C)  98 F (36.7 C)  TempSrc:  Oral  Oral  Resp: 17 15  16   Height:  5\' 2"  (1.575 m)    Weight:  71.2 kg (156 lb 15.5 oz)    SpO2: 98% 93% 95% 92%    Intake/Output Summary (Last 24 hours) at 07/04/13 1128 Last data filed at 07/04/13 1119  Gross per 24 hour  Intake   1020 ml  Output    650 ml  Net    370 ml    LABS: Basic Metabolic Panel:  Recent Labs  45/40/98 0019 07/04/13 0810  NA 135 138  K 4.5 3.7  CL 100 100  CO2 19 26  GLUCOSE 141* 100*  BUN 22 22  CREATININE 1.12* 1.12*  CALCIUM 10.3 10.3  MG  --  2.5   CBC:  Recent Labs  07/04/13 0019  WBC 9.0  HGB 13.4  HCT 40.6  MCV 87.7  PLT 240   Cardiac Enzymes:  Recent Labs  07/04/13 0100 07/04/13 0810  TROPONINI 1.22* 0.84*   BNP    Component Value Date/Time   PROBNP 1436.0* 07/04/2013 0810    Radiology/Studies:  *RADIOLOGY REPORT*  Clinical Data: Respiratory distress  PORTABLE CHEST - 1 VIEW  Comparison: 02/23/2013  Findings: Interstitial coarsening. Mild left lung  base opacity.  Heart size within normal limits. Aortic atherosclerosis. No  definite pleural effusion. No pneumothorax. Osteopenia. Limited  further evaluation of the bones secondary to portable technique and  patient body habitus.  IMPRESSION:  Interstitial prominence is similar to prior. Mild left lung base  opacity; atelectasis versus infiltrate.  Original Report Authenticated By: Jearld Lesch, M.D.   ECG interpretation: Normal sinus rhythm with no acute ST-T wave change. ECHOCARDIOGRAM: 02/12/13 ------------------------------------------------------------ LV EF: 40% - 50%  ------------------------------------------------------------ Indications: CHF - 428.0.  ------------------------------------------------------------ History: PMH: Dyspnea. Congestive heart failure.  ------------------------------------------------------------ Study Conclusions  Left ventricle: The cavity size was normal. Systolic function was mildly to moderately reduced. The estimated ejection fraction was in the range of 40% to 50%. There is hypokinesis of the mid-distalanteroseptal and apical myocardium. Features are consistent with a pseudonormal left ventricular filling pattern, with concomitant abnormal relaxation and increased filling pressure (grade 2 diastolic dysfunction).  CORONARY ANGIOGRAPHY (2012): Totally occluded mid LAD with collaterals from the left and right coronary system. 25% mid circumflex Widely patent RCA  Physical Exam: Blood pressure 123/59, pulse 93, temperature 98 F (36.7 C), temperature source Oral, resp. rate 16, height 5\' 2"  (1.575 m), weight 71.2 kg (156 lb 15.5 oz), SpO2 92.00%.  Weight change:    Distant breath sounds. No wheezing. Basal rales.  Regular rhythm. No murmur or gallop.  No peripheral edema.  Memory disorder. No focal deficits on neuro  ASSESSMENT:   1. Acute on chronic diastolic heart failure , improving with diuresis.  2. Acute coronary  syndrome, now pain free with diuresis. The patient has known LAD occlusion and perhaps angina is related to heart failure with increased wall tension from volume/pressure overload.   3 chronic COPD with chronic dyspnea on exertion  4. Peripheral arterial disease   Plan:  1. Agree with diuresis  2. IV nitrates vs topical  3. Heparinization  4. Trend cardiac markers and clinical course. Suspect medical therapy is going to be the most appropriate treatment strategy.  Selinda Eon 07/04/2013, 11:28 AM

## 2013-07-04 NOTE — ED Provider Notes (Signed)
CSN: [id number]     Arrival date & time 07/04/13  0011 History   First MD Initiated Contact with Patient 07/04/13 0016     Chief Complaint  Patient presents with  . Respiratory Distress   (Consider location/radiation/quality/duration/timing/severity/associated sxs/prior Treatment) The history is provided by the patient.   79 year old female with a history of CHF and COPD noted onset about 6 PM of severe dyspnea. This was associated with some crampy upper abdominal pain. She denies fever or chills. There's been no change in a chronic cough. She denies chest pain. There has been some diaphoresis. Symptoms are severe. She is on home oxygen and states that she felt fine last night and during the day today. EMS was called and placed her on CPAP and transported her to the ED.  Past Medical History  Diagnosis Date  . DVT (deep venous thrombosis)   . Anemia   . Back injury   . Acid reflux   . Hypertension   . Hypercholesterolemia   . Anginal pain   . Asthma   . Hyponatremia   . Congestive heart failure    Past Surgical History  Procedure Laterality Date  . Abdominal surgery    . Tonsillectomy    . Hernia repair    . Angioplasty      no stent  . Vesicovaginal fistula closure w/ tah    . Breast biopsy      left side  . Knee surgery      left knee  . Leg bypass      left leg   Family History  Problem Relation Age of Onset  . Emphysema Brother     2 brothers  . Emphysema Father   . Heart failure Mother   . Diabetes Mother    History  Substance Use Topics  . Smoking status: Never Smoker   . Smokeless tobacco: Never Used  . Alcohol Use: No   OB History   Grav Para Term Preterm Abortions TAB SAB Ect Mult Living                 Review of Systems  All other systems reviewed and are negative.    Allergies  Sulfa antibiotics  Home Medications   Current Outpatient Rx  Name  Route  Sig  Dispense  Refill  . acetaminophen (TYLENOL) 325 MG tablet   Oral   Take 2  tablets (650 mg total) by mouth every 6 (six) hours as needed.         Marland Kitchen albuterol (PROVENTIL HFA;VENTOLIN HFA) 108 (90 BASE) MCG/ACT inhaler   Inhalation   Inhale 2 puffs into the lungs every 6 (six) hours as needed for wheezing.         Marland Kitchen albuterol (PROVENTIL) (5 MG/ML) 0.5% nebulizer solution   Nebulization   Take 0.5 mLs (2.5 mg total) by nebulization 3 (three) times daily.   20 mL      . aspirin 81 MG tablet   Oral   Take 81 mg by mouth daily.         . Cholecalciferol (VITAMIN D) 1000 UNITS capsule   Oral   Take 1,000 Units by mouth daily.         . clopidogrel (PLAVIX) 75 MG tablet   Oral   Take 75 mg by mouth daily.         . ferrous sulfate 325 (65 FE) MG tablet   Oral   Take 325 mg by mouth daily with breakfast.           .  Fluticasone-Salmeterol (ADVAIR DISKUS) 100-50 MCG/DOSE AEPB      1 puff then rise mouth, two times every day   60 each   prn   . furosemide (LASIX) 40 MG tablet   Oral   Take 1 tablet (40 mg total) by mouth daily.   30 tablet      . isosorbide mononitrate (IMDUR) 30 MG 24 hr tablet   Oral   Take 30 mg by mouth daily.         . metoprolol tartrate (LOPRESSOR) 25 MG tablet   Oral   Take 25 mg by mouth 2 (two) times daily.         . nitroGLYCERIN (NITROSTAT) 0.4 MG SL tablet   Sublingual   Place 0.4 mg under the tongue every 5 (five) minutes as needed.           . pantoprazole (PROTONIX) 40 MG tablet   Oral   Take 40 mg by mouth daily.         . potassium chloride (KLOR-CON) 20 MEQ packet   Oral   Take 20 mEq by mouth 2 (two) times daily.         . pravastatin (PRAVACHOL) 20 MG tablet   Oral   Take 20 mg by mouth daily.         . ranitidine (ZANTAC) 150 MG tablet   Oral   Take 150 mg by mouth 2 (two) times daily.           Marland Kitchen tiotropium (SPIRIVA) 18 MCG inhalation capsule   Inhalation   Place 18 mcg into inhaler and inhale daily.          BP 127/84  Pulse 87  Temp(Src) 98 F (36.7 C)  (Axillary)  Resp 21  SpO2 100% Physical Exam  Nursing note and vitals reviewed.  79 year old female, on BiPAP and in mild respiratory distress. Vital signs are significant for tachypnea with respiratory rate of 21 with slight use of accessory muscles of respiration. Oxygen saturation is 100%, which is normal. Head is normocephalic and atraumatic. PERRLA, EOMI. Oropharynx is clear. Neck is nontender and supple without adenopathy. JVD is present. Back is nontender and there is no CVA tenderness. Lungs have bibasilar rales which are more prominent on the left, and diffuse wheezes. Chest is nontender. Heart has regular rate and rhythm without murmur. Abdomen is soft, flat, nontender without masses or hepatosplenomegaly and peristalsis is normoactive. Extremities have 2+ edema, full range of motion is present. Skin is warm and dry without rash. Neurologic: Mental status is normal, cranial nerves are intact, there are no motor or sensory deficits.  ED Course  Procedures (including critical care time) Results for orders placed during the hospital encounter of 07/04/13  CBC      Result Value Range   WBC 9.0  4.0 - 10.5 K/uL   RBC 4.63  3.87 - 5.11 MIL/uL   Hemoglobin 13.4  12.0 - 15.0 g/dL   HCT 16.1  09.6 - 04.5 %   MCV 87.7  78.0 - 100.0 fL   MCH 28.9  26.0 - 34.0 pg   MCHC 33.0  30.0 - 36.0 g/dL   RDW 40.9  81.1 - 91.4 %   Platelets 240  150 - 400 K/uL  BASIC METABOLIC PANEL      Result Value Range   Sodium 135  135 - 145 mEq/L   Potassium 4.5  3.5 - 5.1 mEq/L   Chloride 100  96 - 112  mEq/L   CO2 19  19 - 32 mEq/L   Glucose, Bld 141 (*) 70 - 99 mg/dL   BUN 22  6 - 23 mg/dL   Creatinine, Ser 4.09 (*) 0.50 - 1.10 mg/dL   Calcium 81.1  8.4 - 91.4 mg/dL   GFR calc non Af Amer 44 (*) >90 mL/min   GFR calc Af Amer 50 (*) >90 mL/min  PRO B NATRIURETIC PEPTIDE      Result Value Range   Pro B Natriuretic peptide (BNP) 748.2 (*) 0 - 450 pg/mL  TROPONIN I      Result Value Range    Troponin I 1.22 (*) <0.30 ng/mL  POCT I-STAT TROPONIN I      Result Value Range   Troponin i, poc 0.36 (*) 0.00 - 0.08 ng/mL   Comment NOTIFIED PHYSICIAN     Comment 3            Dg Chest Port 1 View  07/04/2013   *RADIOLOGY REPORT*  Clinical Data: Respiratory distress  PORTABLE CHEST - 1 VIEW  Comparison: 02/23/2013  Findings: Interstitial coarsening.  Mild left lung base opacity. Heart size within normal limits.  Aortic atherosclerosis.  No definite pleural effusion.  No pneumothorax.  Osteopenia.  Limited further evaluation of the bones secondary to portable technique and patient body habitus.  IMPRESSION: Interstitial prominence is similar to prior.  Mild left lung base opacity; atelectasis versus infiltrate.   Original Report Authenticated By: Jearld Lesch, M.D.   Images viewed by me.   Date: 07/04/2013  Rate: 88  Rhythm: normal sinus rhythm  QRS Axis: left  Intervals: normal  ST/T Wave abnormalities: nonspecific T wave changes  Conduction Disutrbances:left anterior fascicular block  Narrative Interpretation: Left ventricular hypertrophy, left anterior fascicular block, nonspecific T wave changes. When compared with ECG of 02/23/2013, no significant changes are seen.  Old EKG Reviewed: unchanged  CRITICAL CARE Performed by: NWGNF,AOZHY Total critical care time: 40 minutes Critical care time was exclusive of separately billable procedures and treating other patients. Critical care was necessary to treat or prevent imminent or life-threatening deterioration. Critical care was time spent personally by me on the following activities: development of treatment plan with patient and/or surrogate as well as nursing, discussions with consultants, evaluation of patient's response to treatment, examination of patient, obtaining history from patient or surrogate, ordering and performing treatments and interventions, ordering and review of laboratory studies, ordering and review of  radiographic studies, pulse oximetry and re-evaluation of patient's condition.  MDM   1. NSTEMI (non-ST elevated myocardial infarction)   2. Congestive heart failure   3. COPD (chronic obstructive pulmonary disease)    Acute dyspnea which is probably a combination of CHF and COPD. She will be given furosemide, and is given a nebulizer treatment with albuterol and ipratropium. She does not appear to be insufficient distress to warrant BiPAP now so she is taken off of BiPAP. Old records are reviewed and she had been admitted in April for combined CHF and COPD exacerbation. She had a catheterization in 2000 which showed chronic occlusion of the LAD and was felt to be best managed medically.  Chest x-ray shows asymmetric airspace disease worrisome for possible pneumonia, so she is started on ceftriaxone and azithromycin to treat possible community-acquired pneumonia.  POC Troponin has come back elevated so troponin was repeated in the main lab which is also come back significantly elevated. She is started on heparin and treated at probable non-STEMI. That is the best  explanation for her sudden onset of dyspnea. At this point, she is resting comfortably without needing BiPAP and she is no longer using accessory muscles of respiration. On reexam, she no longer is having any wheezing. Case is discussed with Dr. Tresa Endo of cardiology service who agrees to admit the patient.  Dione Booze, MD 07/04/13 [phone]

## 2013-07-04 NOTE — ED Notes (Signed)
CRITICAL VALUE ALERT  Critical value received:  Troponin 1.22  Date of notification:  07/04/2013  Time of notification:  0223  Critical value read back:yes  Nurse who received alert:  Lurline Idol, RN   MD notified: Preston Fleeting

## 2013-07-04 NOTE — Progress Notes (Signed)
ANTICOAGULATION CONSULT NOTE - Follow Up Consult  Pharmacy Consult for Heparin Indication: chest pain/ACS, NSTEMI  Allergies  Allergen Reactions  . Sulfa Antibiotics Other (See Comments)    unknown    Patient Measurements: Height: 5\' 2"  (157.5 cm) Weight: 156 lb 15.5 oz (71.2 kg) IBW/kg (Calculated) : 50.1 Heparin Dosing Weight: 68 kg  Vital Signs: Temp: 98.2 F (36.8 C) (08/28 1951) Temp src: Oral (08/28 1951) BP: 112/39 mmHg (08/28 1951) Pulse Rate: 82 (08/28 1951)  Labs:  Recent Labs  07/04/13 0019 07/04/13 0100 07/04/13 0810 07/04/13 1121 07/04/13 1455 07/04/13 2019  HGB 13.4  --   --  13.1  --   --   HCT 40.6  --   --  39.5  --   --   PLT 240  --   --  233  --   --   APTT  --   --  112*  --   --   --   LABPROT  --   --  13.5  --   --   --   INR  --   --  1.05  --   --   --   HEPARINUNFRC  --   --   --  0.57  --  0.55  CREATININE 1.12*  --  1.12*  --   --   --   TROPONINI  --  1.22* 0.84*  --  1.49*  --     Estimated Creatinine Clearance: 33.9 ml/min (by C-G formula based on Cr of 1.12).   Medications:  Heparin at 1000 units/hr  Assessment: 79 y/o F on heparin drip for NSTEMI. Cardiology note from today mentions medical therapy as most appropriate treatment strategy. Confirmatory HL this evening is 0.55<0.57. CBC good. Scr 1.12 with CrCl ~ 30-40. No overt bleeding noted.   Goal of Therapy:  Heparin level 0.3-0.7 units/ml Monitor platelets by anticoagulation protocol: Yes   Plan:  -Continue heparin at 1000 units/hr -Daily CBC/HL -Monitor for bleeding -Continue to follow cardiology plans  Thank you for allowing me to take part in this patient's care,  Abran Duke, PharmD Clinical Pharmacist Phone: [phone] Pager: [phone] 07/04/2013 9:10 PM

## 2013-07-04 NOTE — H&P (Signed)
[name] is an 79 y.o. female.   Chief Complaint: Acute Shortness of Breath Primary Cardiologist: Dr. Eldridge Dace HPI: [name] is an 79 yo woman with PMH of CAD, COPD on home oxygen 2L, hypertension, dyslipidemia with recent admission in early April who presents overnight tonight with acute shortness of breath. Echo in 04/14 with EF 40-50%, hypokinesis of mid distal anteroseptal and apical walls with pseudonormal LV filling pattern, abnormal grade II DD. She was feeling well until abrupt SOB late evening and came to hospital around 11-12. She felt improved in the ER and has been nearly SOB free. No real chest pain. No syncope. No fever/chills, recent sick contacts. She may be gaining some weight but hasn't weighed herself in a few days. She is accompanied by her daughter who doesn't know how the last 2-3 days have gone but previously her mother/father have been weighing her daily. She received IV lasix in ER (with some UOP), antibiotics and heparin.    Past Medical History  Diagnosis Date  . DVT (deep venous thrombosis)   . Anemia   . Back injury   . Acid reflux   . Hypertension   . Hypercholesterolemia   . Anginal pain   . Asthma   . Hyponatremia   . Congestive heart failure     Past Surgical History  Procedure Laterality Date  . Abdominal surgery    . Tonsillectomy    . Hernia repair    . Angioplasty      no stent  . Vesicovaginal fistula closure w/ tah    . Breast biopsy      left side  . Knee surgery      left knee  . Leg bypass      left leg    Family History  Problem Relation Age of Onset  . Emphysema Brother     2 brothers  . Emphysema Father   . Heart failure Mother   . Diabetes Mother    Social History:  reports that she has never smoked. She has never used smokeless tobacco. She reports that she does not drink alcohol or use illicit drugs.  Allergies:  Allergies  Allergen Reactions  . Sulfa Antibiotics Other (See Comments)    unknown     Medications Prior to Admission  Medication Sig Dispense Refill  . acetaminophen (TYLENOL) 325 MG tablet Take 650 mg by mouth every 6 (six) hours as needed for pain.      Marland Kitchen albuterol (PROVENTIL HFA;VENTOLIN HFA) 108 (90 BASE) MCG/ACT inhaler Inhale 2 puffs into the lungs every 6 (six) hours as needed for wheezing.      Marland Kitchen albuterol (PROVENTIL) (5 MG/ML) 0.5% nebulizer solution Take 0.5 mLs (2.5 mg total) by nebulization 3 (three) times daily.  20 mL    . aspirin EC 81 MG tablet Take 81 mg by mouth daily.      . Cholecalciferol (VITAMIN D) 1000 UNITS capsule Take 1,000 Units by mouth daily.      . clopidogrel (PLAVIX) 75 MG tablet Take 75 mg by mouth daily.      . ferrous sulfate 325 (65 FE) MG tablet Take 325 mg by mouth daily with breakfast.        . Fluticasone-Salmeterol (ADVAIR) 100-50 MCG/DOSE AEPB Inhale 1 puff into the lungs every 12 (twelve) hours.      . furosemide (LASIX) 40 MG tablet Take 1 tablet (40 mg total) by mouth daily.  30 tablet    . isosorbide mononitrate (IMDUR)  30 MG 24 hr tablet Take 30 mg by mouth daily.      . metoprolol tartrate (LOPRESSOR) 25 MG tablet Take 25 mg by mouth 2 (two) times daily.      . nitroGLYCERIN (NITROSTAT) 0.4 MG SL tablet Place 0.4 mg under the tongue every 5 (five) minutes as needed.        . pantoprazole (PROTONIX) 40 MG tablet Take 40 mg by mouth daily.      . potassium chloride (KLOR-CON) 20 MEQ packet Take 20 mEq by mouth 2 (two) times daily.      . pravastatin (PRAVACHOL) 20 MG tablet Take 20 mg by mouth daily.      . ranitidine (ZANTAC) 150 MG tablet Take 150 mg by mouth 2 (two) times daily.        Marland Kitchen tiotropium (SPIRIVA) 18 MCG inhalation capsule Place 18 mcg into inhaler and inhale daily.        Results for orders placed during the hospital encounter of 07/04/13 (from the past 48 hour(s))  CBC     Status: None   Collection Time    07/04/13 12:19 AM      Result Value Range   WBC 9.0  4.0 - 10.5 K/uL   RBC 4.63  3.87 - 5.11 MIL/uL    Hemoglobin 13.4  12.0 - 15.0 g/dL   HCT 40.9  81.1 - 91.4 %   MCV 87.7  78.0 - 100.0 fL   MCH 28.9  26.0 - 34.0 pg   MCHC 33.0  30.0 - 36.0 g/dL   RDW 78.2  95.6 - 21.3 %   Platelets 240  150 - 400 K/uL  BASIC METABOLIC PANEL     Status: Abnormal   Collection Time    07/04/13 12:19 AM      Result Value Range   Sodium 135  135 - 145 mEq/L   Potassium 4.5  3.5 - 5.1 mEq/L   Chloride 100  96 - 112 mEq/L   CO2 19  19 - 32 mEq/L   Glucose, Bld 141 (*) 70 - 99 mg/dL   BUN 22  6 - 23 mg/dL   Creatinine, Ser 0.86 (*) 0.50 - 1.10 mg/dL   Calcium 57.8  8.4 - 46.9 mg/dL   GFR calc non Af Amer 44 (*) >90 mL/min   GFR calc Af Amer 50 (*) >90 mL/min   Comment: (NOTE)     The eGFR has been calculated using the CKD EPI equation.     This calculation has not been validated in all clinical situations.     eGFR's persistently <90 mL/min signify possible Chronic Kidney     Disease.  PRO B NATRIURETIC PEPTIDE     Status: Abnormal   Collection Time    07/04/13 12:19 AM      Result Value Range   Pro B Natriuretic peptide (BNP) 748.2 (*) 0 - 450 pg/mL  POCT I-STAT TROPONIN I     Status: Abnormal   Collection Time    07/04/13 12:30 AM      Result Value Range   Troponin i, poc 0.36 (*) 0.00 - 0.08 ng/mL   Comment NOTIFIED PHYSICIAN     Comment 3            Comment: Due to the release kinetics of cTnI,     a negative result within the first hours     of the onset of symptoms does not rule out     myocardial infarction with  certainty.     If myocardial infarction is still suspected,     repeat the test at appropriate intervals.  TROPONIN I     Status: Abnormal   Collection Time    07/04/13  1:00 AM      Result Value Range   Troponin I 1.22 (*) <0.30 ng/mL   Comment:            Due to the release kinetics of cTnI,     a negative result within the first hours     of the onset of symptoms does not rule out     myocardial infarction with certainty.     If myocardial infarction is still  suspected,     repeat the test at appropriate intervals.     CRITICAL RESULT CALLED TO, READ BACK BY AND VERIFIED WITH:     MURRILL,S RN 07/04/2013 0221 JORDANS     REPEATED TO VERIFY  MRSA PCR SCREENING     Status: None   Collection Time    07/04/13  3:44 AM      Result Value Range   MRSA by PCR NEGATIVE  NEGATIVE   Comment:            The GeneXpert MRSA Assay (FDA     approved for NASAL specimens     only), is one component of a     comprehensive MRSA colonization     surveillance program. It is not     intended to diagnose MRSA     infection nor to guide or     monitor treatment for     MRSA infections.   Dg Chest Port 1 View  07/04/2013   *RADIOLOGY REPORT*  Clinical Data: Respiratory distress  PORTABLE CHEST - 1 VIEW  Comparison: 02/23/2013  Findings: Interstitial coarsening.  Mild left lung base opacity. Heart size within normal limits.  Aortic atherosclerosis.  No definite pleural effusion.  No pneumothorax.  Osteopenia.  Limited further evaluation of the bones secondary to portable technique and patient body habitus.  IMPRESSION: Interstitial prominence is similar to prior.  Mild left lung base opacity; atelectasis versus infiltrate.   Original Report Authenticated By: Jearld Lesch, M.D.    Review of Systems  Constitutional: Positive for malaise/fatigue. Negative for fever and chills.  HENT: Negative for hearing loss, neck pain and tinnitus.   Eyes: Negative for double vision, photophobia and pain.  Respiratory: Positive for shortness of breath.   Cardiovascular: Positive for orthopnea. Negative for chest pain, palpitations and leg swelling.  Gastrointestinal: Negative for nausea, vomiting and abdominal pain.  Genitourinary: Negative for dysuria.  Musculoskeletal: Negative for myalgias.  Skin: Negative for rash.  Neurological: Negative for dizziness, tingling, tremors and headaches.  Endo/Heme/Allergies: Negative for polydipsia. Does not bruise/bleed easily.   Psychiatric/Behavioral: Negative for suicidal ideas and substance abuse.    Blood pressure 120/57, pulse 91, temperature 98.1 F (36.7 C), temperature source Oral, resp. rate 15, height 5\' 2"  (1.575 m), weight 71.2 kg (156 lb 15.5 oz), SpO2 93.00%. Physical Exam  Nursing note and vitals reviewed. Constitutional: She appears well-developed and well-nourished. No distress.  HENT:  Head: Normocephalic and atraumatic.  Nose: Nose normal.  Mouth/Throat: Oropharynx is clear and moist. No oropharyngeal exudate.  Eyes: Conjunctivae and EOM are normal. Pupils are equal, round, and reactive to light. No scleral icterus.  Neck: Normal range of motion. Neck supple. JVD present. No tracheal deviation present. No thyromegaly present.  Midway up neck at 30 degrees, +HJR  Cardiovascular: Normal rate, regular rhythm, normal heart sounds and intact distal pulses.  Exam reveals no gallop.   No murmur heard. Respiratory: Effort normal. No respiratory distress. She has no wheezes. She has rales.  Crackles at the bases  GI: Soft. Bowel sounds are normal. She exhibits no distension. There is no tenderness. There is no rebound.  Musculoskeletal: Normal range of motion. She exhibits no edema and no tenderness.  Neurological: She is alert. No cranial nerve deficit.  Oriented to person, hospital  Skin: Skin is warm and dry. She is not diaphoretic.  Psychiatric: She has a normal mood and affect. Her behavior is normal.   Labs reviewed as above ECG reviewed; sinus with anterior qs Prior Echo Reviewed  Assessment/Plan 79 yo woman with COPD home oxygen, heart failure and known CAD here with worsening SOB and elevated troponin. Differential is type II NSTEMI from COPD or HF, heart failure exacerbation or COPD exacerbation. I favor a diagnosis of multifactorial etiology with larger component from heart failure with elevated JVP and some rales at the bases. She was given IV lasix x1 in ER, antibiotics and started on  heparin gtt. I have continued the heparin gtt for now but we will see how to proceed. She is also NPO. Will trend troponins to better understand etiology. Dr. Eldridge Dace knows her well and can decide if further ischemic evaluation warranted.  - will continue moxifloxacin/FQ  - continue heparin gtt while trending troponins - no Echo update given recent echo - aspirin given - continue home COPD medications, BB - Will write for one more dose of IV lasix this AM and defer to primary team otherwise - PT/OT will need to be consulted   Hattie Aguinaldo 07/04/2013, 5:37 AM

## 2013-07-04 NOTE — ED Notes (Signed)
Portable CXR at bedside.

## 2013-07-04 NOTE — ED Notes (Signed)
Patient switched to BIPAP by RRT upon arrival to [facility]. BIPAP d/c'd per EDP verbal order at bedside. Patient placed on 2L [place], SPO2 96% RR 25. Patient able to speak in complete sentences. No dyspnea noted. Chest expansion symmetrical. Will continue to monitor.

## 2013-07-04 NOTE — Progress Notes (Addendum)
ANTICOAGULATION CONSULT NOTE - Follow up   Pharmacy Consult for heparin  Indication: nstemi  Allergies  Allergen Reactions  . Sulfa Antibiotics Other (See Comments)    unknown    Patient Measurements: Height: 5\' 2"  (157.5 cm) Weight: 156 lb 15.5 oz (71.2 kg) IBW/kg (Calculated) : 50.1 Weight: 68kg   Vital Signs: Temp: 98.4 F (36.9 C) (08/28 1200) Temp src: Oral (08/28 1200) BP: 111/66 mmHg (08/28 1200) Pulse Rate: 62 (08/28 1200)  Labs:  Recent Labs  07/04/13 0019 07/04/13 0100 07/04/13 0810 07/04/13 1121  HGB 13.4  --   --  13.1  HCT 40.6  --   --  39.5  PLT 240  --   --  233  APTT  --   --  112*  --   LABPROT  --   --  13.5  --   INR  --   --  1.05  --   HEPARINUNFRC  --   --   --  0.57  CREATININE 1.12*  --  1.12*  --   TROPONINI  --  1.22* 0.84*  --     Estimated Creatinine Clearance: 33.9 ml/min (by C-G formula based on Cr of 1.12).   Medical History: Past Medical History  Diagnosis Date  . DVT (deep venous thrombosis)   . Anemia   . Back injury   . Acid reflux   . Hypertension   . Hypercholesterolemia   . Anginal pain   . Asthma   . Hyponatremia   . Congestive heart failure     Medications:  Prescriptions prior to admission  Medication Sig Dispense Refill  . acetaminophen (TYLENOL) 325 MG tablet Take 650 mg by mouth every 6 (six) hours as needed for pain.      Marland Kitchen albuterol (PROVENTIL HFA;VENTOLIN HFA) 108 (90 BASE) MCG/ACT inhaler Inhale 2 puffs into the lungs every 6 (six) hours as needed for wheezing.      Marland Kitchen albuterol (PROVENTIL) (5 MG/ML) 0.5% nebulizer solution Take 0.5 mLs (2.5 mg total) by nebulization 3 (three) times daily.  20 mL    . aspirin EC 81 MG tablet Take 81 mg by mouth daily.      . Cholecalciferol (VITAMIN D) 1000 UNITS capsule Take 1,000 Units by mouth daily.      . clopidogrel (PLAVIX) 75 MG tablet Take 75 mg by mouth daily.      . ferrous sulfate 325 (65 FE) MG tablet Take 325 mg by mouth daily with breakfast.        .  Fluticasone-Salmeterol (ADVAIR) 100-50 MCG/DOSE AEPB Inhale 1 puff into the lungs every 12 (twelve) hours.      . furosemide (LASIX) 40 MG tablet Take 1 tablet (40 mg total) by mouth daily.  30 tablet    . isosorbide mononitrate (IMDUR) 30 MG 24 hr tablet Take 30 mg by mouth daily.      . metoprolol tartrate (LOPRESSOR) 25 MG tablet Take 25 mg by mouth 2 (two) times daily.      . nitroGLYCERIN (NITROSTAT) 0.4 MG SL tablet Place 0.4 mg under the tongue every 5 (five) minutes as needed.        . pantoprazole (PROTONIX) 40 MG tablet Take 40 mg by mouth daily.      . potassium chloride (KLOR-CON) 20 MEQ packet Take 20 mEq by mouth 2 (two) times daily.      . pravastatin (PRAVACHOL) 20 MG tablet Take 20 mg by mouth daily.      Marland Kitchen  ranitidine (ZANTAC) 150 MG tablet Take 150 mg by mouth 2 (two) times daily.        Marland Kitchen tiotropium (SPIRIVA) 18 MCG inhalation capsule Place 18 mcg into inhaler and inhale daily.        Assessment: 79 yo female currently on IV heparin infusion for possible NSTEMI.  The initial 8 hour heparin level is therapeutic at 0.57 IU/ml on heparin drip 1000 units/hr. No bleeding reported. CBC is stable.  Troponin 1.22 > 0.84.   Acute on chronic diastolic heart failure , improving with diuresis.   No oral anticoagulants on PTA meds.   Goal of Therapy:  Heparin level 0.3-0.7 units/ml Monitor platelets by anticoagulation protocol: Yes   Plan:  Continue IV Heparin 1000 units/hr.  Recheck heparin level (with next Troponin @1745 ) to confirm level remains therapeutic.   Daily HL and cbc to start 8/29    Noah Delaine, RPh Clinical Pharmacist Pager: [phone] 07/04/2013,12:22 PM

## 2013-07-05 LAB — LIPID PANEL
HDL: 33 mg/dL — ABNORMAL LOW (ref >39)
LDL Cholesterol: 69 mg/dL (ref 0–99)
Total CHOL/HDL Ratio: 5.4 RATIO
VLDL: 75 mg/dL — ABNORMAL HIGH (ref 0–40)

## 2013-07-05 LAB — CBC
HCT: 37.4 % (ref 36.0–46.0)
Hemoglobin: 12 g/dL (ref 12.0–15.0)
MCHC: 32.1 g/dL (ref 30.0–36.0)
MCV: 88.2 fL (ref 78.0–100.0)
WBC: 5.6 10*3/uL (ref 4.0–10.5)

## 2013-07-05 LAB — HEPARIN LEVEL (UNFRACTIONATED): Heparin Unfractionated: 0.53 IU/mL (ref 0.30–0.70)

## 2013-07-05 LAB — BASIC METABOLIC PANEL
BUN: 20 mg/dL (ref 6–23)
Chloride: 101 mEq/L (ref 96–112)
Glucose, Bld: 112 mg/dL — ABNORMAL HIGH (ref 70–99)
Potassium: 4.6 mEq/L (ref 3.5–5.1)

## 2013-07-05 MED ORDER — HEART ATTACK BOUNCING BOOK
Freq: Once | Status: DC
  Filled 2013-07-05: qty 1

## 2013-07-05 MED ORDER — SODIUM CHLORIDE 0.9 % IV SOLN: INTRAVENOUS | Status: DC

## 2013-07-05 MED ORDER — ISOSORBIDE MONONITRATE ER 60 MG PO TB24
60.0000 mg | ORAL_TABLET | Freq: Every day | ORAL | Status: DC
  Administered 2013-07-06: 11:00:00 60 mg via ORAL
  Filled 2013-07-05: qty 1

## 2013-07-05 NOTE — Progress Notes (Signed)
CARDIAC REHAB PHASE I   PRE:  Rate/Rhythm: 78SR  BP:  Supine: 125/50  Sitting:   Standing:    SaO2: 96% 1.5L  MODE:  Ambulation: 240 ft   POST:  Rate/Rhythm: 116ST  BP:  Supine:   Sitting: 189/75.,  166/71 rest  Standing:    SaO2: 94%2L 0744-0810 Pt walked 240 ft on 2L with gait belt use, rolling walker and asst x 2. Tendency to get walker too far in front. Encouraged pt to slow down and keep walker closer. States she has rollator and walker at home. Would recommend PT consult to evaluate home needs and help with safety eval. To recliner after walk. Left on 2L. Heart rate and BP elevated after walk. Denied CP. C/o slightly SOB. Call bel in reach.   Luetta Nutting, RN BSN  07/05/2013 8:05 AM

## 2013-07-05 NOTE — Progress Notes (Signed)
Subjective:  Doing well, no current complaints. Walks with cardiac rehabilitation. Mildly short of breath. Recommendation for PT.  Objective:  Vital Signs in the last 24 hours: Temp:  [97.6 F (36.4 C)-98.8 F (37.1 C)] 98.4 F (36.9 C) (08/29 1123) Pulse Rate:  [80-111] 100 (08/29 1123) Resp:  [18-19] 18 (08/29 1123) BP: (112-151)/(39-84) 114/66 mmHg (08/29 1123) SpO2:  [96 %-98 %] 98 % (08/29 1123) Weight:  [72.6 kg (160 lb 0.9 oz)] 72.6 kg (160 lb 0.9 oz) (08/29 0300)  Intake/Output from previous day: 08/28 0701 - 08/29 0700 In: 1200 [P.O.:960; I.V.:240] Out: 2000 [Urine:2000]   Physical Exam: General: Elderly in no acute distress. Head:  Normocephalic and atraumatic. Lungs: Clear to auscultation no wheezing Heart: Normal S1 and S2.  No murmur, rubs or gallops.  Abdomen: soft, non-tender, positive bowel sounds. Extremities: No clubbing or cyanosis. No edema. Neurologic: Alert pleasant    Lab Results:  Recent Labs  07/04/13 1121 07/05/13 0440  WBC 8.1 5.6  HGB 13.1 12.0  PLT 233 190    Recent Labs  07/04/13 0810 07/05/13 0440  NA 138 138  K 3.7 4.6  CL 100 101  CO2 26 21  GLUCOSE 100* 112*  BUN 22 20  CREATININE 1.12* 1.42*    Recent Labs  07/04/13 1455 07/04/13 2320  TROPONINI 1.49* 1.39*   Hepatic Function Panel  Recent Labs  07/04/13 0810  PROT 6.4  ALBUMIN 3.7  AST 22  ALT 11  ALKPHOS 89  BILITOT 0.3    Recent Labs  07/05/13 0440  CHOL 177   No results found for this basename: PROTIME,  in the last 72 hours  Imaging: Dg Chest Port 1 View  07/04/2013   *RADIOLOGY REPORT*  Clinical Data: Respiratory distress  PORTABLE CHEST - 1 VIEW  Comparison: 02/23/2013  Findings: Interstitial coarsening.  Mild left lung base opacity. Heart size within normal limits.  Aortic atherosclerosis.  No definite pleural effusion.  No pneumothorax.  Osteopenia.  Limited further evaluation of the bones secondary to portable technique and patient body  habitus.  IMPRESSION: Interstitial prominence is similar to prior.  Mild left lung base opacity; atelectasis versus infiltrate.   Original Report Authenticated By: Jearld Lesch, M.D.   Personally viewed.   Telemetry: No adverse arrhythmias Personally viewed.  Cardiac Studies:  Prior EF 40-50%  Assessment/Plan:  Principal Problem:   NSTEMI (non-ST elevated myocardial infarction) Active Problems:   Dyspnea on exertion   Peripheral arterial disease   Coronary artery disease   COPD (chronic obstructive pulmonary disease)   - I will increase her isosorbide from 30 mg at home to 60 mg.  - Continue her metoprolol, aspirin, Plavix, Lasix 40 mg once a day.  - Her COPD appears to be stable, no wheezes.  - She has received IV Levaquin for possible pneumonia. White blood cell count is normal. No fever. Pneumonia less likely. I will discontinue. She responded well to diuresis initially.  - I will stop her heparin at 2 AM, for a total of 48 hours of use. Troponin is trending downward. Mildly elevated.  - Cardiac rehabilitation recommended PT evaluation. I have ordered.  - Hopeful discharge tomorrow.   SKAINS, MARK 07/05/2013, 3:49 PM

## 2013-07-05 NOTE — Evaluation (Signed)
Physical Therapy Evaluation Patient Details Name: [name] MRN: [id number] DOB: [birth year] Today's Date: 07/05/2013 Time: 7829-5621 PT Time Calculation (min): 19 min  PT Assessment / Plan / Recommendation History of Present Illness  79 y.o. female admitted to [facility] on 07/04/13 with PMH of CAD, COPD on home oxygen 2L, hypertension, dyslipidemia with recent admission in early April who presents overnight tonight with acute shortness of breath.  Dx with NSTEMi.    Clinical Impression  Pt is progressing well with mobility.  Definite safety issues with using RW and has some DOE with gait on 2 L O2 [place].  She may need O2 all the time at home now (will need to further assess).  She would benefit from HHPT, but reports of her last experience with HHPT that, "I didn't notice a difference".  PT will follow acutely with cardiac rehab for deficits listed below.     PT Assessment  Patient needs continued PT services    Follow Up Recommendations  Home health PT;Supervision for mobility/OOB    Does the patient have the potential to tolerate intense rehabilitation    Yes  Barriers to Discharge   None none    Equipment Recommendations  None recommended by PT    Recommendations for Other Services   None  Frequency Min 3X/week    Precautions / Restrictions Precautions Precautions: Fall Precaution Comments: pt is a bit impulsive with RW   Pertinent Vitals/Pain See vitals flow sheet.      Mobility  Bed Mobility Bed Mobility: Supine to Sit;Sitting - Scoot to Edge of Bed;Sit to Supine Supine to Sit: 6: Modified independent (Device/Increase time);With rails;HOB elevated Sitting - Scoot to Edge of Bed: 6: Modified independent (Device/Increase time);With rail Sit to Supine: 6: Modified independent (Device/Increase time);With rail;HOB flat Details for Bed Mobility Assistance: heavy reliance on rails to get to sitting EOB.   Transfers Transfers: Sit to Stand;Stand to Sit Sit to Stand: 5:  Supervision;With upper extremity assist;From bed Stand to Sit: 5: Supervision;With upper extremity assist;To bed Details for Transfer Assistance: reliance on hands for balance and stability during transitions.  Ambulation/Gait Ambulation/Gait Assistance: 4: Min assist Ambulation Distance (Feet): 150 Feet Assistive device: Rolling walker Ambulation/Gait Assistance Details: min assist as pt is a bit impulsive with RW.  She picks it up and jerks it around obstacles.  Per her chart she has a rollator at home.  Verbal cues to stand closer to RW did not help as pt kept RW at arms distance out in front of her instead of staying inside despite near constant verbal cues.   Gait Pattern: Step-through pattern;Shuffle;Trunk flexed General Gait Details: DOE 2/4 with gait on 2L O2 [place] O2 sats 91-92 % during ambulation.          PT Diagnosis: Difficulty walking;Abnormality of gait;Generalized weakness  PT Problem List: Decreased strength;Decreased activity tolerance;Decreased balance;Decreased mobility;Decreased knowledge of use of DME;Cardiopulmonary status limiting activity;Obesity PT Treatment Interventions: DME instruction;Gait training;Functional mobility training;Therapeutic activities;Therapeutic exercise;Balance training;Neuromuscular re-education;Patient/family education     PT Goals(Current goals can be found in the care plan section) Acute Rehab PT Goals Patient Stated Goal: to get back home PT Goal Formulation: With patient Time For Goal Achievement: 07/19/13 Potential to Achieve Goals: Good  Visit Information  Last PT Received On: 07/05/13 Assistance Needed: +1 History of Present Illness: 79 y.o. female admitted to [facility] on 07/04/13 with PMH of CAD, COPD on home oxygen 2L, hypertension, dyslipidemia with recent admission in early April who presents overnight tonight with  acute shortness of breath.  Dx with NSTEMi.         Prior Functioning  Home Living Family/patient expects to be  discharged to:: Private residence Living Arrangements: Spouse/significant other Available Help at Discharge: Family;Personal care attendant;Available 24 hours/day (spouse works, Comptroller is there when he is not home) Type of Home: House Home Access: Level entry Home Layout: One level Home Equipment: Environmental consultant - 4 wheels (home O2 per pt report only at night. ) Prior Function Level of Independence: Independent with assistive device(s) Comments: sitter there during the day, but doesn't cook or clean or do anything  but "sit there and stare at me" Communication Communication: HOH Dominant Hand: Right    Cognition  Cognition Arousal/Alertness: Awake/alert Behavior During Therapy: WFL for tasks assessed/performed Overall Cognitive Status: Within Functional Limits for tasks assessed    Extremity/Trunk Assessment Upper Extremity Assessment Upper Extremity Assessment: Overall WFL for tasks assessed Lower Extremity Assessment Lower Extremity Assessment: Generalized weakness Cervical / Trunk Assessment Cervical / Trunk Assessment: Kyphotic      End of Session PT - End of Session Equipment Utilized During Treatment: Oxygen Activity Tolerance: Patient limited by fatigue Patient left: in bed;with call bell/phone within reach    Geneva B. Maruice Pieroni, PT, DPT [phone]   07/05/2013, 5:05 PM

## 2013-07-05 NOTE — Progress Notes (Signed)
ANTICOAGULATION CONSULT NOTE - Follow Up Consult  Pharmacy Consult for Heparin Indication: chest pain/ACS  Allergies  Allergen Reactions  . Sulfa Antibiotics Other (See Comments)    unknown    Patient Measurements: Height: 5\' 2"  (157.5 cm) Weight: 160 lb 0.9 oz (72.6 kg) IBW/kg (Calculated) : 50.1 Heparin Dosing Weight:   Vital Signs: Temp: 98 F (36.7 C) (08/29 0729) Temp src: Oral (08/29 0729) BP: 135/53 mmHg (08/29 0729) Pulse Rate: 82 (08/29 0729)  Labs:  Recent Labs  07/04/13 0019  07/04/13 0810 07/04/13 1121 07/04/13 1455 07/04/13 2019 07/04/13 2320 07/05/13 0440  HGB 13.4  --   --  13.1  --   --   --  12.0  HCT 40.6  --   --  39.5  --   --   --  37.4  PLT 240  --   --  233  --   --   --  190  APTT  --   --  112*  --   --   --   --   --   LABPROT  --   --  13.5  --   --   --   --   --   INR  --   --  1.05  --   --   --   --   --   HEPARINUNFRC  --   --   --  0.57  --  0.55  --  0.53  CREATININE 1.12*  --  1.12*  --   --   --   --  1.42*  TROPONINI  --   < > 0.84*  --  1.49*  --  1.39*  --   < > = values in this interval not displayed.  Estimated Creatinine Clearance: 27 ml/min (by C-G formula based on Cr of 1.42).   Medications:  Scheduled:  . aspirin EC  81 mg Oral Daily  . cholecalciferol  1,000 Units Oral Daily  . clopidogrel  75 mg Oral Q breakfast  . famotidine  20 mg Oral Daily  . ferrous sulfate  325 mg Oral Q breakfast  . furosemide  40 mg Oral Daily  . heart attack bouncing book   Does not apply Once  . isosorbide mononitrate  30 mg Oral Daily  . levofloxacin  750 mg Intravenous Q48H  . metoprolol tartrate  25 mg Oral BID  . mometasone-formoterol  2 puff Inhalation BID  . nitroGLYCERIN  0.3 mg Transdermal Daily  . pantoprazole  40 mg Oral Daily  . potassium chloride  20 mEq Oral BID  . simvastatin  20 mg Oral q1800  . tiotropium  18 mcg Inhalation Daily    Assessment: 79yo female on Heparin for R/O MI.  Heparin level therapeutic  this AM on 1000 units/hr.  No bleeding problems noted.    Goal of Therapy:  INR 2-3 Monitor platelets by anticoagulation protocol: Yes   Plan:  1.  Continue Heparin 1000 units/hr 2.  F/U in AM  Marisue Humble, PharmD Clinical Pharmacist [facility]

## 2013-07-06 LAB — CBC
MCHC: 33 g/dL (ref 30.0–36.0)
Platelets: 185 10*3/uL (ref 150–400)
RDW: 13.7 % (ref 11.5–15.5)

## 2013-07-06 LAB — HEPARIN LEVEL (UNFRACTIONATED): Heparin Unfractionated: <0.1 IU/mL — ABNORMAL LOW (ref 0.30–0.70)

## 2013-07-06 MED ORDER — ISOSORBIDE MONONITRATE ER 30 MG PO TB24: 60.0000 mg | ORAL_TABLET | Freq: Every day | ORAL | Status: DC

## 2013-07-06 NOTE — Progress Notes (Signed)
Phase I Cardiac Rehab  Pt ready for discharge, pt declined offer to ambulate in hallway.  Pt states "I'm ready to go home and waiting on my papers."  Pt education completed with patient and spouse.  Education included risk factors, diet, activity progression, medications.  Pt oriented to outpatient cardiac rehab however pt declined program is far from her home.  Understanding verbalized

## 2013-07-06 NOTE — Discharge Summary (Signed)
Patient ID: [name] MRN: [id number] DOB/AGE: 08-05-1926 79 y.o.  Admit date: 07/04/2013 Discharge date: 07/06/2013 Patient Active Problem List   Diagnosis Date Noted  . NSTEMI (non-ST elevated myocardial infarction) 07/04/2013  . COPD (chronic obstructive pulmonary disease) 07/04/2013  . Congestive heart failure   . Hyponatremia   . Acute respiratory failure 02/07/2013  . CHF (congestive heart failure) 02/07/2013  . Acute systolic heart failure 02/07/2013  . Dyspnea on exertion 06/07/2012  . Peripheral arterial disease 06/07/2012  . Coronary artery disease 06/07/2012  . Asthma 06/07/2012   Primary Discharge Diagnosis: NSTEMI Secondary Discharge Diagnosis: Acute disatolic heart failure  COPD  Peripheral arterial disease  Acute kidney injury with diuresis  Significant Diagnostic Studies: None  Consults: None  Hospital Course: She presented with progressive dyspnea and 7 chest pain developing over several days following the stress of an MRI scan. Troponin markers were elevated. Prior catheterization and demonstrated nonobstructive coronary disease. Diuresis and up titration of nitrates resolved the clinical syndrome. Conservative medical management was felt most prudent.  At the time of discharge, the patient is improved. She will go home with home health are in and physical therapy followup. She is being assessed to determine if home O2 as necessary. She will followup with her primary cardiologist in one week.  Creatinine rose to 1.42 with diuresis. Home Lasix dose was not changed at discharge to  Weight on admission, 156 pounds. Weight on discharge unchanged.  Discharge Exam: Blood pressure 111/45, pulse 77, temperature 98.1 F (36.7 C), temperature source Oral, resp. rate 22, height 5\' 2"  (1.575 m), weight 71 kg (156 lb 8.4 oz), SpO2 94.00%.    Lying comfortably in bed but still on oxygen.  Distant breath sounds. No rales.  No gallop is heard on cardiac  auscultation.  Extremities reveal no edema.   Labs:   Lab Results  Component Value Date   WBC 4.0 07/06/2013   HGB 11.7* 07/06/2013   HCT 35.5* 07/06/2013   MCV 87.9 07/06/2013   PLT 185 07/06/2013    Recent Labs Lab 07/04/13 0810 07/05/13 0440  NA 138 138  K 3.7 4.6  CL 100 101  CO2 26 21  BUN 22 20  CREATININE 1.12* 1.42*  CALCIUM 10.3 10.5  PROT 6.4  --   BILITOT 0.3  --   ALKPHOS 89  --   ALT 11  --   AST 22  --   GLUCOSE 100* 112*   Lab Results  Component Value Date   CKTOTAL 93 02/08/2013   CKMB 5.5* 02/08/2013   TROPONINI 1.39* 07/04/2013    Lab Results  Component Value Date   CHOL 177 07/05/2013   CHOL 148 07/02/2011   Lab Results  Component Value Date   HDL 33* 07/05/2013   HDL 32* 07/02/2011   Lab Results  Component Value Date   LDLCALC 69 07/05/2013   LDLCALC 81 07/02/2011   Lab Results  Component Value Date   TRIG 373* 07/05/2013   TRIG 175* 07/02/2011   Lab Results  Component Value Date   CHOLHDL 5.4 07/05/2013   CHOLHDL 4.6 07/02/2011   No results found for this basename: LDLDIRECT      Radiology: Increased interstitial prominence on admitting chest x-ray EKG: Nonspecific ST-T wave abnormality   FOLLOW UP PLANS AND APPOINTMENTS    Medication List         acetaminophen 325 MG tablet  Commonly known as:  TYLENOL  Take 650 mg by  mouth every 6 (six) hours as needed for pain.     albuterol (5 MG/ML) 0.5% nebulizer solution  Commonly known as:  PROVENTIL  Take 0.5 mLs (2.5 mg total) by nebulization 3 (three) times daily.     albuterol 108 (90 BASE) MCG/ACT inhaler  Commonly known as:  PROVENTIL HFA;VENTOLIN HFA  Inhale 2 puffs into the lungs every 6 (six) hours as needed for wheezing.     aspirin EC 81 MG tablet  Take 81 mg by mouth daily.     clopidogrel 75 MG tablet  Commonly known as:  PLAVIX  Take 75 mg by mouth daily.     ferrous sulfate 325 (65 FE) MG tablet  Take 325 mg by mouth daily with breakfast.      Fluticasone-Salmeterol 100-50 MCG/DOSE Aepb  Commonly known as:  ADVAIR  Inhale 1 puff into the lungs every 12 (twelve) hours.     furosemide 40 MG tablet  Commonly known as:  LASIX  Take 1 tablet (40 mg total) by mouth daily.     isosorbide mononitrate 30 MG 24 hr tablet  Commonly known as:  IMDUR  Take 2 tablets (60 mg total) by mouth daily.     metoprolol tartrate 25 MG tablet  Commonly known as:  LOPRESSOR  Take 25 mg by mouth 2 (two) times daily.     nitroGLYCERIN 0.4 MG SL tablet  Commonly known as:  NITROSTAT  Place 0.4 mg under the tongue every 5 (five) minutes as needed.     pantoprazole 40 MG tablet  Commonly known as:  PROTONIX  Take 40 mg by mouth daily.     potassium chloride 20 MEQ packet  Commonly known as:  KLOR-CON  Take 20 mEq by mouth 2 (two) times daily.     pravastatin 20 MG tablet  Commonly known as:  PRAVACHOL  Take 20 mg by mouth daily.     ranitidine 150 MG tablet  Commonly known as:  ZANTAC  Take 150 mg by mouth 2 (two) times daily.     tiotropium 18 MCG inhalation capsule  Commonly known as:  SPIRIVA  Place 18 mcg into inhaler and inhale daily.     Vitamin D 1000 UNITS capsule  Take 1,000 Units by mouth daily.           Follow-up Information   Follow up with Corky Crafts., MD In 1 week.   Specialty:  Cardiology   Contact information:   [address] [place] [address] [phone]       BRING ALL MEDICATIONS WITH YOU TO FOLLOW UP APPOINTMENTS  Time spent with patient to include physician time: 35 minutes Signed: Lesleigh Noe 07/06/2013, 8:58 AM

## 2013-07-07 ENCOUNTER — Emergency Department (HOSPITAL_COMMUNITY): Payer: Medicare HMO

## 2013-07-07 ENCOUNTER — Encounter (HOSPITAL_COMMUNITY): Payer: Self-pay | Admitting: Emergency Medicine

## 2013-07-07 ENCOUNTER — Inpatient Hospital Stay (HOSPITAL_COMMUNITY)
Admission: EM | Admit: 2013-07-07 | Discharge: 2013-07-11 | DRG: 246 | Disposition: A | Discharge status: Home Health | Payer: Medicare HMO | Attending: Interventional Cardiology | Admitting: Interventional Cardiology

## 2013-07-07 DIAGNOSIS — I5021 Acute systolic (congestive) heart failure: Secondary | ICD-10-CM

## 2013-07-07 DIAGNOSIS — F411 Generalized anxiety disorder: Secondary | ICD-10-CM | POA: Diagnosis present

## 2013-07-07 DIAGNOSIS — J9600 Acute respiratory failure, unspecified whether with hypoxia or hypercapnia: Secondary | ICD-10-CM | POA: Diagnosis present

## 2013-07-07 DIAGNOSIS — Z882 Allergy status to sulfonamides status: Secondary | ICD-10-CM

## 2013-07-07 DIAGNOSIS — I2582 Chronic total occlusion of coronary artery: Secondary | ICD-10-CM | POA: Diagnosis present

## 2013-07-07 DIAGNOSIS — I509 Heart failure, unspecified: Secondary | ICD-10-CM | POA: Diagnosis present

## 2013-07-07 DIAGNOSIS — I2510 Atherosclerotic heart disease of native coronary artery without angina pectoris: Secondary | ICD-10-CM | POA: Diagnosis present

## 2013-07-07 DIAGNOSIS — Z9849 Cataract extraction status, unspecified eye: Secondary | ICD-10-CM

## 2013-07-07 DIAGNOSIS — I252 Old myocardial infarction: Secondary | ICD-10-CM

## 2013-07-07 DIAGNOSIS — Z833 Family history of diabetes mellitus: Secondary | ICD-10-CM

## 2013-07-07 DIAGNOSIS — J4489 Other specified chronic obstructive pulmonary disease: Secondary | ICD-10-CM | POA: Diagnosis present

## 2013-07-07 DIAGNOSIS — J449 Chronic obstructive pulmonary disease, unspecified: Secondary | ICD-10-CM

## 2013-07-07 DIAGNOSIS — N3020 Other chronic cystitis without hematuria: Secondary | ICD-10-CM | POA: Diagnosis present

## 2013-07-07 DIAGNOSIS — Z836 Family history of other diseases of the respiratory system: Secondary | ICD-10-CM

## 2013-07-07 DIAGNOSIS — Z86711 Personal history of pulmonary embolism: Secondary | ICD-10-CM

## 2013-07-07 DIAGNOSIS — N189 Chronic kidney disease, unspecified: Secondary | ICD-10-CM | POA: Diagnosis present

## 2013-07-07 DIAGNOSIS — R4181 Age-related cognitive decline: Secondary | ICD-10-CM | POA: Diagnosis present

## 2013-07-07 DIAGNOSIS — I129 Hypertensive chronic kidney disease with stage 1 through stage 4 chronic kidney disease, or unspecified chronic kidney disease: Secondary | ICD-10-CM | POA: Diagnosis present

## 2013-07-07 DIAGNOSIS — I739 Peripheral vascular disease, unspecified: Secondary | ICD-10-CM | POA: Diagnosis present

## 2013-07-07 DIAGNOSIS — Z79899 Other long term (current) drug therapy: Secondary | ICD-10-CM

## 2013-07-07 DIAGNOSIS — K219 Gastro-esophageal reflux disease without esophagitis: Secondary | ICD-10-CM | POA: Diagnosis present

## 2013-07-07 DIAGNOSIS — Z7982 Long term (current) use of aspirin: Secondary | ICD-10-CM

## 2013-07-07 DIAGNOSIS — I5033 Acute on chronic diastolic (congestive) heart failure: Secondary | ICD-10-CM | POA: Diagnosis present

## 2013-07-07 DIAGNOSIS — I214 Non-ST elevation (NSTEMI) myocardial infarction: Principal | ICD-10-CM | POA: Diagnosis present

## 2013-07-07 DIAGNOSIS — Z86718 Personal history of other venous thrombosis and embolism: Secondary | ICD-10-CM

## 2013-07-07 DIAGNOSIS — Z7902 Long term (current) use of antithrombotics/antiplatelets: Secondary | ICD-10-CM

## 2013-07-07 DIAGNOSIS — E7800 Pure hypercholesterolemia, unspecified: Secondary | ICD-10-CM | POA: Diagnosis present

## 2013-07-07 DIAGNOSIS — E785 Hyperlipidemia, unspecified: Secondary | ICD-10-CM | POA: Diagnosis present

## 2013-07-07 DIAGNOSIS — Z9089 Acquired absence of other organs: Secondary | ICD-10-CM

## 2013-07-07 LAB — URINALYSIS, ROUTINE W REFLEX MICROSCOPIC
Bilirubin Urine: NEGATIVE
Hgb urine dipstick: NEGATIVE
Nitrite: NEGATIVE
Specific Gravity, Urine: 1.013 (ref 1.005–1.030)
Urobilinogen, UA: 0.2 mg/dL (ref 0.0–1.0)
pH: 6 (ref 5.0–8.0)

## 2013-07-07 LAB — CBC
HCT: 38.2 % (ref 36.0–46.0)
Hemoglobin: 12.9 g/dL (ref 12.0–15.0)
MCH: 29.9 pg (ref 26.0–34.0)
MCHC: 33.8 g/dL (ref 30.0–36.0)
MCV: 88.6 fL (ref 78.0–100.0)
RDW: 13.6 % (ref 11.5–15.5)

## 2013-07-07 LAB — POCT I-STAT 3, ART BLOOD GAS (G3+)
O2 Saturation: 99 %
TCO2: 26 mmol/L (ref 0–100)
pCO2 arterial: 40.8 mmHg (ref 35.0–45.0)
pH, Arterial: 7.394 (ref 7.350–7.450)
pO2, Arterial: 139 mmHg — ABNORMAL HIGH (ref 80.0–100.0)

## 2013-07-07 LAB — POCT I-STAT, CHEM 8
BUN: 25 mg/dL — ABNORMAL HIGH (ref 6–23)
Creatinine, Ser: 1.5 mg/dL — ABNORMAL HIGH (ref 0.50–1.10)
Glucose, Bld: 233 mg/dL — ABNORMAL HIGH (ref 70–99)
Hemoglobin: 12.9 g/dL (ref 12.0–15.0)
TCO2: 22 mmol/L (ref 0–100)

## 2013-07-07 MED ORDER — VANCOMYCIN HCL IN DEXTROSE 1-5 GM/200ML-% IV SOLN
1000.0000 mg | Freq: Once | INTRAVENOUS | Status: AC
  Administered 2013-07-07: 1000 mg via INTRAVENOUS
  Filled 2013-07-07: qty 200

## 2013-07-07 MED ORDER — HYDROMORPHONE HCL PF 1 MG/ML IJ SOLN: 1.0000 mg | INTRAMUSCULAR | Status: DC | PRN

## 2013-07-07 MED ORDER — VANCOMYCIN HCL 1000 MG IV SOLR: 15.0000 mg/kg | Freq: Once | INTRAVENOUS | Status: DC

## 2013-07-07 MED ORDER — ONDANSETRON HCL 4 MG/2ML IJ SOLN: 4.0000 mg | Freq: Three times a day (TID) | INTRAMUSCULAR | Status: DC | PRN

## 2013-07-07 MED ORDER — FUROSEMIDE 10 MG/ML IJ SOLN
40.0000 mg | Freq: Once | INTRAMUSCULAR | Status: AC
  Administered 2013-07-07: 40 mg via INTRAVENOUS
  Filled 2013-07-07: qty 4

## 2013-07-07 MED ORDER — PIPERACILLIN-TAZOBACTAM 3.375 G IVPB 30 MIN
3.3750 g | Freq: Once | INTRAVENOUS | Status: AC
  Administered 2013-07-07: 3.375 g via INTRAVENOUS
  Filled 2013-07-07: qty 50

## 2013-07-07 MED ORDER — ONDANSETRON HCL 4 MG/2ML IJ SOLN
INTRAMUSCULAR | Status: AC
  Filled 2013-07-07: qty 2

## 2013-07-07 MED ORDER — PIPERACILLIN-TAZOBACTAM 3.375 G IVPB: 3.3750 g | Freq: Once | INTRAVENOUS | Status: DC

## 2013-07-07 MED ORDER — ONDANSETRON HCL 4 MG/2ML IJ SOLN
4.0000 mg | Freq: Once | INTRAMUSCULAR | Status: AC
Start: 2013-07-07 — End: 2013-07-07
  Administered 2013-07-07: 4 mg via INTRAVENOUS

## 2013-07-07 NOTE — ED Notes (Signed)
Per EMS: Pt from home with respiratory distress. Patient was d/c from hospital yesterday following an MI and pneumonia. Pt reported substernal nonradiating CP. EMS placed pt on CPAP. Wheezing initially auscultated, improved after 3 Albuterol treatments. Pt able to speak in 3-4 words sentences at this time. Ax4 at this time. History of A-Fib.

## 2013-07-07 NOTE — ED Provider Notes (Signed)
CSN: [id number]     Arrival date & time 07/07/13  1957 History   First MD Initiated Contact with Patient 07/07/13 2007     Chief Complaint  Patient presents with  . Respiratory Distress   (Consider location/radiation/quality/duration/timing/severity/associated sxs/prior Treatment) Patient is a 79 y.o. female presenting with shortness of breath. The history is provided by the patient and the EMS personnel. No language interpreter was used.  Shortness of Breath Severity:  Severe Onset quality:  Sudden Duration: prior to arrival. Timing:  Constant Progression:  Worsening Chronicity:  New Context: URI   Context comment:  Recent hospitalization Relieved by:  Oxygen Worsened by:  Exertion and activity Ineffective treatments:  Inhaler Associated symptoms: chest pain, cough and vomiting   Associated symptoms: no abdominal pain, no fever and no sore throat   Risk factors: hx of PE/DVT   Risk factors comment:  Recent hospitalization   Past Medical History  Diagnosis Date  . DVT (deep venous thrombosis)   . Acid reflux   . Hypertension   . Hypercholesterolemia   . Anginal pain   . Asthma   . Hyponatremia   . Congestive heart failure   . PONV (postoperative nausea and vomiting)   . Myocardial infarction 2014; 07/03/2013  . COPD (chronic obstructive pulmonary disease)   . Pneumonia     "as a child and again in 2013" (07/04/2013)  . Chronic bronchitis     "once or twice/yr" (07/04/2013)  . Exertional shortness of breath   . Pernicious anemia   . History of blood transfusion     "cause my blood was low" (07/04/2013)  . WUJWJXBJ(478.2)     "probably weekly" (07/04/2013)  . Acoustic neuroma     "Dr. Suzanna Obey is testing for this in her right ear right now" (07/04/2013)  . Deafness in right ear   . Arthritis     "left knee; back" (07/04/2013)  . Lumbar compression fracture 2010; 2012    "fell; fell" (07/04/2013)  . Anxiety   . Chronic cystitis    Past Surgical History  Procedure  Laterality Date  . Abdominal surgery  1960's    "tumor removed" (07/04/2013)  . Inguinal hernia repair Right   . Vesicovaginal fistula closure w/ tah    . Breast biopsy Left 1980's  . Knee arthroscopy Left 1980's  . Cataract extraction, bilateral Bilateral ~ 2011  . Tonsillectomy  1930's  . Cholecystectomy    . Appendectomy    . Bladder suspension  05/16/2005    Hattie Perch 05/16/2005 (07/04/2013)  . Cardiac catheterization  2012  . Femoral-popliteal bypass graft Left 1990's   Family History  Problem Relation Age of Onset  . Emphysema Brother     2 brothers  . Emphysema Father   . Heart failure Mother   . Diabetes Mother    History  Substance Use Topics  . Smoking status: Never Smoker   . Smokeless tobacco: Never Used  . Alcohol Use: No   OB History   Grav Para Term Preterm Abortions TAB SAB Ect Mult Living                 Review of Systems  Constitutional: Negative for fever.  HENT: Negative for congestion, sore throat and rhinorrhea.   Respiratory: Positive for cough and shortness of breath.   Cardiovascular: Positive for chest pain.  Gastrointestinal: Positive for nausea and vomiting. Negative for abdominal pain and diarrhea.  Genitourinary: Negative for dysuria and hematuria.  Neurological: Negative for syncope and  light-headedness.  All other systems reviewed and are negative.    Allergies  Sulfa antibiotics  Home Medications   Current Outpatient Rx  Name  Route  Sig  Dispense  Refill  . acetaminophen (TYLENOL) 325 MG tablet   Oral   Take 650 mg by mouth every 6 (six) hours as needed for pain.         Marland Kitchen albuterol (PROVENTIL HFA;VENTOLIN HFA) 108 (90 BASE) MCG/ACT inhaler   Inhalation   Inhale 2 puffs into the lungs every 6 (six) hours as needed for wheezing.         Marland Kitchen albuterol (PROVENTIL) (5 MG/ML) 0.5% nebulizer solution   Nebulization   Take 0.5 mLs (2.5 mg total) by nebulization 3 (three) times daily.   20 mL      . aspirin EC 81 MG tablet    Oral   Take 81 mg by mouth daily.         . Cholecalciferol (VITAMIN D) 1000 UNITS capsule   Oral   Take 1,000 Units by mouth daily.         . clopidogrel (PLAVIX) 75 MG tablet   Oral   Take 75 mg by mouth daily.         . ferrous sulfate 325 (65 FE) MG tablet   Oral   Take 325 mg by mouth daily with breakfast.           . Fluticasone-Salmeterol (ADVAIR) 100-50 MCG/DOSE AEPB   Inhalation   Inhale 1 puff into the lungs every 12 (twelve) hours.         . furosemide (LASIX) 40 MG tablet   Oral   Take 1 tablet (40 mg total) by mouth daily.   30 tablet      . isosorbide mononitrate (IMDUR) 30 MG 24 hr tablet   Oral   Take 2 tablets (60 mg total) by mouth daily.   30 tablet   11   . metoprolol tartrate (LOPRESSOR) 25 MG tablet   Oral   Take 25 mg by mouth 2 (two) times daily.         . nitroGLYCERIN (NITROSTAT) 0.4 MG SL tablet   Sublingual   Place 0.4 mg under the tongue every 5 (five) minutes as needed.           . pantoprazole (PROTONIX) 40 MG tablet   Oral   Take 40 mg by mouth daily.         . potassium chloride (KLOR-CON) 20 MEQ packet   Oral   Take 20 mEq by mouth 2 (two) times daily.         . pravastatin (PRAVACHOL) 20 MG tablet   Oral   Take 20 mg by mouth daily.         . ranitidine (ZANTAC) 150 MG tablet   Oral   Take 150 mg by mouth 2 (two) times daily.           Marland Kitchen tiotropium (SPIRIVA) 18 MCG inhalation capsule   Inhalation   Place 18 mcg into inhaler and inhale daily.          BP 113/55  Pulse 101  Temp(Src) 97.7 F (36.5 C) (Axillary)  Resp 24  SpO2 97% Physical Exam  Nursing note and vitals reviewed. Constitutional: She is oriented to person, place, and time. She appears well-developed and well-nourished. No distress.  HENT:  Head: Normocephalic and atraumatic.  Eyes: EOM are normal. Pupils are equal, round, and reactive to light.  Neck: Normal range of motion. Neck supple.  Cardiovascular: Normal rate, regular  rhythm, normal heart sounds and intact distal pulses.  Exam reveals no gallop and no friction rub.   No murmur heard. Pulmonary/Chest: She is in respiratory distress. She has no wheezes. She has rales. She exhibits no tenderness.  tachypnea  Abdominal: Soft. Bowel sounds are normal. She exhibits distension. There is no tenderness. There is no rebound.  Musculoskeletal: Normal range of motion. She exhibits no edema and no tenderness.  Lymphadenopathy:    She has no cervical adenopathy.  Neurological: She is alert and oriented to person, place, and time.  Skin: Skin is warm. No rash noted. She is not diaphoretic.  Psychiatric: She has a normal mood and affect. Her behavior is normal.    ED Course  Procedures (including critical care time) Labs Review Labs Reviewed  CBC - Abnormal; Notable for the following:    WBC 12.0 (*)    All other components within normal limits  PRO B NATRIURETIC PEPTIDE - Abnormal; Notable for the following:    Pro B Natriuretic peptide (BNP) 1731.0 (*)    All other components within normal limits  POCT I-STAT, CHEM 8 - Abnormal; Notable for the following:    BUN 25 (*)    Creatinine, Ser 1.50 (*)    Glucose, Bld 233 (*)    Calcium, Ion 1.34 (*)    All other components within normal limits  POCT I-STAT TROPONIN I - Abnormal; Notable for the following:    Troponin i, poc 1.00 (*)    All other components within normal limits  CULTURE, EXPECTORATED SPUTUM-ASSESSMENT  URINALYSIS, ROUTINE W REFLEX MICROSCOPIC  BLOOD GAS, ARTERIAL   Imaging Review Dg Chest Port 1 View  07/07/2013   *RADIOLOGY REPORT*  Clinical Data: Short of breath  PORTABLE CHEST - 1 VIEW  Comparison: 07/04/2013  Findings: Mild cardiac enlargement.  Asymmetric airspace disease on the right with fluid in the right minor fissure.  This may represent fluid overload and edema.  Pneumonia also possible. Small left effusion is suspected.  There is underlying chronic lung disease.  IMPRESSION:  Asymmetric airspace disease on the right.  This may represent edema or pneumonia.   Original Report Authenticated By: Janeece Riggers, M.D.    MDM   1. NSTEMI (non-ST elevated myocardial infarction)   2. Acute respiratory failure   3. CHF (congestive heart failure)   4. COPD (chronic obstructive pulmonary disease)    8:22 PM Pt is a 79 y.o. female with pertinent PMHX of COPD,HTN,dCHF, recent admission for PNA  who presents to the ED with shortness of breath. Pt presented with shortness of breath sudden onset prior to arrival. Deneis fevers,endorses  cough, congestion, no runny nose. No sick contacts. Pain described as sharp in nature, off and on now resolved. Worse with coughing, relieved by oxygen.  4,81mg , ASA given to pt by husband at home this evening. Discharged yesterday after hospitalization for dCHF exacerbation. Remote history of negative cath.  On exam: AFVSS, initially tachypnic rales in the bases bilaterally. Diminished globally.  EKG personally reviewed by myself showed NSR 1st degree AV block Rate of 78, PR , QRS QT/QTC 369/437ms, left axis, without evidence of new ischemia. Comparison showed similar, indication: shortness of breath  CXR PA/LAT for shortness of breath showed unilateral opacity concerning for pneumonia possible aspiration given nausea and vomiting. Some element of overload.  Review of labs: Ua without evidence of UTI, BUN cr elevated 25/1.5. Otherwise no other  electrolyte abnormalities. iStat troponin 1.00. CBC showed leukocytosis, H&H 12.9/38.2. BNP 1731. Given elevated white count and concern for hospital acquired bacterial pneumonia for recent hosptialization will start Vanc and Zosyn in the ED. Plan to discuss case with cardiology. Given previous cath that showed non-obstructive CAD and in the setting pneumonia, cardiology feels pt's symptoms more likely due to pneumonia. Likely pneumonia is also driving heart failure also. Plan to consult hospitalist for  admission for further evaluation and management. Will obtain ABG and give 40mg  IV lasix  On re-evaluation pt's work of breathing decreased improved after lasix.  The patient appears reasonably stabilized for admission considering the current resources, flow, and capabilities available in the ED at this time, and I doubt any other [facility] requiring further screening and/or treatment in the ED prior to admission.   Plan for admission to hospitalist for further evaluation and management. PT transferred to step down VSS.  Labs, EKG and imaging reviewed by myself and considered in medical decision making if ordered.  Imaging interpreted by radiology. Pt was discussed with my attending, Dr. Nicole Cella, MD 07/08/13 [phone]

## 2013-07-07 NOTE — ED Notes (Signed)
Pt alert, NAD, calm, interactive, skin W&D, resps e/u, speaking in clearly, tolerating NRB, family at [facility] x2, abx infusing to L hand, VSS. "feels better".

## 2013-07-07 NOTE — Progress Notes (Signed)
Patient came in via EMS on CPAP.  RT at bedside.  Patient began to vomit so RT immediately removed CPAP and placed patient on 6L [place].  Patients sats on 89% on that.  RT then placed patient on non-rebreather mask and sats came up to 97%.  Patient is tachypnic and mildly labored with her respirations.  MD at bedside and is ok with leaving patient off of BIPAP for now.  Patient states that she is more comfortable now that she has vomited.  RT will continue to monitor patient.

## 2013-07-07 NOTE — Care Management Note (Unsigned)
    Page 1 of 1   07/07/2013     5:10:14 PM   CARE MANAGEMENT NOTE 07/07/2013  Patient:  [name], [name]   Account Number:  [id number]  Date Initiated:  07/07/2013  Documentation initiated by:  [facility]  Subjective/Objective Assessment:     Action/Plan:   Anticipated DC Date:     Anticipated DC Plan:  HOME W HOME HEALTH SERVICES      DC Planning Services  CM consult      Choice offered to / List presented to:             Status of service:  Completed, signed off Medicare Important Message given?   (If response is "NO", the following Medicare IM given date fields will be blank) Date Medicare IM given:   Date Additional Medicare IM given:    Discharge Disposition:  HOME/SELF CARE  Per UR Regulation:    If discussed at Long Length of Stay Meetings, dates discussed:    Comments:  07/07/13 17:00 CM spoke with pt's husband and pt concerning home health.  HH was refused.  Freddy Jaksch, BSN, CM

## 2013-07-07 NOTE — H&P (Addendum)
[facility] History and Physical  [name] JXB:[id number] DOB: December 08, 1925 DOA: 07/07/2013  Referring physician: ER physician. PCP: [name], MD  Specialists: Dr. Eldridge Dace. Cardiologist.  Chief Complaint: Shortness of breath.  HPI: [name] is a 79 y.o. female who was discharged home yesterday after being treated for non-ST elevation MI and treated medically presented to the ER because of shortness of breath. Patient states that this evening around 5 PM patient became acutely short of breath and started having epigastric chest and left arm discomfort. Patient also had episode of nausea and vomiting. Patient was brought to the ER and was found to be acutely short of breath and has been placed on 100% nonrebreather. Chest x-ray was showing asymmetric edema concerning for CHF versus pneumonia. EKG were showing nonspecific findings and troponins are elevated. On-call cardiologist Dr.Aitsebaomo was consulted and at this time requested hospitalist admission. Presently patient had received Lasix and antibiotics and had good diuresis. Patient presently feels better. Denies any chest pain at this time. Patient denies any abdominal pain fever chills has had mild productive cough. On exam patient has bilateral wheeze and crepitations. Denies any diarrhea.  Review of Systems: As presented in the history of presenting illness, rest negative.  Past Medical History  Diagnosis Date  . DVT (deep venous thrombosis)   . Acid reflux   . Hypertension   . Hypercholesterolemia   . Anginal pain   . Asthma   . Hyponatremia   . Congestive heart failure   . PONV (postoperative nausea and vomiting)   . Myocardial infarction 2014; 07/03/2013  . COPD (chronic obstructive pulmonary disease)   . Pneumonia     "as a child and again in 2013" (07/04/2013)  . Chronic bronchitis     "once or twice/yr" (07/04/2013)  . Exertional shortness of breath   . Pernicious anemia   . History of blood  transfusion     "cause my blood was low" (07/04/2013)  . ZHYQMVHQ(469.6)     "probably weekly" (07/04/2013)  . Acoustic neuroma     "Dr. Suzanna Obey is testing for this in her right ear right now" (07/04/2013)  . Deafness in right ear   . Arthritis     "left knee; back" (07/04/2013)  . Lumbar compression fracture 2010; 2012    "fell; fell" (07/04/2013)  . Anxiety   . Chronic cystitis    Past Surgical History  Procedure Laterality Date  . Abdominal surgery  1960's    "tumor removed" (07/04/2013)  . Inguinal hernia repair Right   . Vesicovaginal fistula closure w/ tah    . Breast biopsy Left 1980's  . Knee arthroscopy Left 1980's  . Cataract extraction, bilateral Bilateral ~ 2011  . Tonsillectomy  1930's  . Cholecystectomy    . Appendectomy    . Bladder suspension  05/16/2005    Hattie Perch 05/16/2005 (07/04/2013)  . Cardiac catheterization  2012  . Femoral-popliteal bypass graft Left 1990's   Social History:  reports that she has never smoked. She has never used smokeless tobacco. She reports that she does not drink alcohol or use illicit drugs. Home. where does patient live-- Can do ADLs. Can patient participate in ADLs?  Allergies  Allergen Reactions  . Sulfa Antibiotics Other (See Comments)    unknown    Family History  Problem Relation Age of Onset  . Emphysema Brother     2 brothers  . Emphysema Father   . Heart failure Mother   . Diabetes Mother  Prior to Admission medications   Medication Sig Start Date End Date Taking? Authorizing Provider  acetaminophen (TYLENOL) 325 MG tablet Take 650 mg by mouth every 6 (six) hours as needed for pain.   Yes Historical Provider, MD  albuterol (PROVENTIL HFA;VENTOLIN HFA) 108 (90 BASE) MCG/ACT inhaler Inhale 2 puffs into the lungs every 6 (six) hours as needed for wheezing.   Yes Historical Provider, MD  albuterol (PROVENTIL) (5 MG/ML) 0.5% nebulizer solution Take 0.5 mLs (2.5 mg total) by nebulization 3 (three) times daily.  02/28/13  Yes Sorin Luanne Bras, MD  aspirin EC 81 MG tablet Take 81 mg by mouth daily.   Yes Historical Provider, MD  Cholecalciferol (VITAMIN D) 1000 UNITS capsule Take 1,000 Units by mouth daily.   Yes Historical Provider, MD  clopidogrel (PLAVIX) 75 MG tablet Take 75 mg by mouth daily.   Yes Historical Provider, MD  ferrous sulfate 325 (65 FE) MG tablet Take 325 mg by mouth daily with breakfast.     Yes Historical Provider, MD  Fluticasone-Salmeterol (ADVAIR) 100-50 MCG/DOSE AEPB Inhale 1 puff into the lungs every 12 (twelve) hours.   Yes Historical Provider, MD  furosemide (LASIX) 40 MG tablet Take 1 tablet (40 mg total) by mouth daily. 02/28/13  Yes Sorin Luanne Bras, MD  isosorbide mononitrate (IMDUR) 30 MG 24 hr tablet Take 2 tablets (60 mg total) by mouth daily. 07/06/13  Yes Lyn Records III, MD  metoprolol tartrate (LOPRESSOR) 25 MG tablet Take 25 mg by mouth 2 (two) times daily.   Yes Historical Provider, MD  nitroGLYCERIN (NITROSTAT) 0.4 MG SL tablet Place 0.4 mg under the tongue every 5 (five) minutes as needed.     Yes Historical Provider, MD  potassium chloride (KLOR-CON) 20 MEQ packet Take 20 mEq by mouth 2 (two) times daily.   Yes Historical Provider, MD  pravastatin (PRAVACHOL) 20 MG tablet Take 20 mg by mouth daily.   Yes Historical Provider, MD  ranitidine (ZANTAC) 150 MG tablet Take 150 mg by mouth 2 (two) times daily.     Yes Historical Provider, MD  tiotropium (SPIRIVA) 18 MCG inhalation capsule Place 18 mcg into inhaler and inhale daily.   Yes Historical Provider, MD  pantoprazole (PROTONIX) 40 MG tablet Take 40 mg by mouth daily.    Historical Provider, MD   Physical Exam: Filed Vitals:   07/07/13 2115 07/07/13 2130 07/07/13 2200 07/07/13 2230  BP: 127/61 108/58 121/56 116/57  Pulse: 100 103 92 99  Temp:      TempSrc:      Resp: 24 22 21 18   Height:      Weight:      SpO2: 100% 100% 99% 99%     General:  Well-developed well-nourished.  Eyes: Anicteric no pallor.  ENT:  No discharge from the ears eyes nose mouth.  Neck: No mass felt.  Cardiovascular: S1-S2 heard.  Respiratory: Bilateral expiratory wheeze and crepitations.  Abdomen: Soft nontender bowel sounds present.  Skin: No rash.  Musculoskeletal: No edema.  Psychiatric: Appears normal.  Neurologic: Alert awake oriented to time place and person. Moves all extremities.  Labs on Admission:  Basic Metabolic Panel:  Recent Labs Lab 07/04/13 0019 07/04/13 0810 07/05/13 0440 07/07/13 2047  NA 135 138 138 136  K 4.5 3.7 4.6 4.0  CL 100 100 101 105  CO2 19 26 21   --   GLUCOSE 141* 100* 112* 233*  BUN 22 22 20  25*  CREATININE 1.12* 1.12* 1.42* 1.50*  CALCIUM  10.3 10.3 10.5  --   MG  --  2.5  --   --    Liver Function Tests:  Recent Labs Lab 07/04/13 0810  AST 22  ALT 11  ALKPHOS 89  BILITOT 0.3  PROT 6.4  ALBUMIN 3.7   No results found for this basename: LIPASE, AMYLASE,  in the last 168 hours No results found for this basename: AMMONIA,  in the last 168 hours CBC:  Recent Labs Lab 07/04/13 0019 07/04/13 1121 07/05/13 0440 07/06/13 0525 07/07/13 2038 07/07/13 2047  WBC 9.0 8.1 5.6 4.0 12.0*  --   HGB 13.4 13.1 12.0 11.7* 12.9 12.9  HCT 40.6 39.5 37.4 35.5* 38.2 38.0  MCV 87.7 86.4 88.2 87.9 88.6  --   PLT 240 233 190 185 210  --    Cardiac Enzymes:  Recent Labs Lab 07/04/13 0100 07/04/13 0810 07/04/13 1455 07/04/13 2320  TROPONINI 1.22* 0.84* 1.49* 1.39*    BNP (last 3 results)  Recent Labs  07/04/13 0019 07/04/13 0810 07/07/13 2038  PROBNP 748.2* 1436.0* 1731.0*   CBG: No results found for this basename: GLUCAP,  in the last 168 hours  Radiological Exams on Admission: Dg Chest Port 1 View  07/07/2013   *RADIOLOGY REPORT*  Clinical Data: Short of breath  PORTABLE CHEST - 1 VIEW  Comparison: 07/04/2013  Findings: Mild cardiac enlargement.  Asymmetric airspace disease on the right with fluid in the right minor fissure.  This may represent fluid  overload and edema.  Pneumonia also possible. Small left effusion is suspected.  There is underlying chronic lung disease.  IMPRESSION: Asymmetric airspace disease on the right.  This may represent edema or pneumonia.   Original Report Authenticated By: Janeece Riggers, M.D.    EKG: Independently reviewed. Normal sinus rhythm with nonspecific ST changes.  Assessment/Plan Principal Problem:   Acute respiratory failure Active Problems:   CHF (congestive heart failure)   NSTEMI (non-ST elevated myocardial infarction)   COPD (chronic obstructive pulmonary disease)   1. Acute respiratory failure - most likely secondary to CHF. Possible aspiration pneumonia. At this time patient did receive Lasix 40 mg IV and has had good output. Patient also simply be started on vancomycin and Zosyn. Check procalcitonin and if normal may discontinue antibiotics. Closely follow intake output and metabolic panel. Admit to step down unit. 2. Non-ST elevation MI - patient has been placed on IV heparin. Continue aspirin and Plavix. Cycle cardiac markers. Further recommendations per cardiologist. 3. COPD - patient has a mild wheezing. Have placed patient on nebulizer and Pulmicort. 4. Hyperlipidemia - continue statins. 5. Probable chronic kidney disease - follow intake output and metabolic panel. 6. Hyperglycemia - recent hemoglobin A1c was 5.8 this month. 7. History of peripheral arterial disease status post bypass.    Code Status: Full code.  Family Communication: Patient's husband and daughter at the bedside.  Disposition Plan: Admit to inpatient.    Kreig Parson N. [facility] Pager [phone].  If 7PM-7AM, please contact night-coverage [email or web address] Password [email or web address] 07/07/2013, 11:01 PM

## 2013-07-08 LAB — CBC WITH DIFFERENTIAL/PLATELET
Basophils Absolute: 0 10*3/uL (ref 0.0–0.1)
Basophils Relative: 0 % (ref 0–1)
Eosinophils Absolute: 0.1 10*3/uL (ref 0.0–0.7)
Eosinophils Relative: 1 % (ref 0–5)
MCH: 28.9 pg (ref 26.0–34.0)
MCHC: 32.8 g/dL (ref 30.0–36.0)
MCV: 88.2 fL (ref 78.0–100.0)
Platelets: 199 10*3/uL (ref 150–400)
RDW: 13.7 % (ref 11.5–15.5)
WBC: 8.3 10*3/uL (ref 4.0–10.5)

## 2013-07-08 LAB — LIPASE, BLOOD: Lipase: 32 U/L (ref 11–59)

## 2013-07-08 LAB — HEPATIC FUNCTION PANEL
Albumin: 3.4 g/dL — ABNORMAL LOW (ref 3.5–5.2)
Indirect Bilirubin: 0.4 mg/dL (ref 0.3–0.9)
Total Protein: 6.3 g/dL (ref 6.0–8.3)

## 2013-07-08 LAB — CBC
MCHC: 33.6 g/dL (ref 30.0–36.0)
RDW: 13.8 % (ref 11.5–15.5)

## 2013-07-08 LAB — COMPREHENSIVE METABOLIC PANEL
ALT: 16 U/L (ref 0–35)
AST: 49 U/L — ABNORMAL HIGH (ref 0–37)
Alkaline Phosphatase: 79 U/L (ref 39–117)
CO2: 27 mEq/L (ref 19–32)
Calcium: 10.7 mg/dL — ABNORMAL HIGH (ref 8.4–10.5)
GFR calc non Af Amer: 34 mL/min — ABNORMAL LOW (ref >90)
Potassium: 4.4 mEq/L (ref 3.5–5.1)
Sodium: 136 mEq/L (ref 135–145)
Total Protein: 6.3 g/dL (ref 6.0–8.3)

## 2013-07-08 LAB — HEPARIN LEVEL (UNFRACTIONATED)
Heparin Unfractionated: 0.79 IU/mL — ABNORMAL HIGH (ref 0.30–0.70)
Heparin Unfractionated: 0.43 IU/mL (ref 0.30–0.70)

## 2013-07-08 LAB — TROPONIN I
Troponin I: 3.53 ng/mL (ref <0.30)
Troponin I: 7.85 ng/mL (ref <0.30)

## 2013-07-08 LAB — PROCALCITONIN: Procalcitonin: <0.1 ng/mL

## 2013-07-08 LAB — PROTIME-INR: INR: 0.99 (ref 0.00–1.49)

## 2013-07-08 MED ORDER — ALBUTEROL SULFATE (5 MG/ML) 0.5% IN NEBU
2.5000 mg | INHALATION_SOLUTION | Freq: Four times a day (QID) | RESPIRATORY_TRACT | Status: DC
  Administered 2013-07-08 – 2013-07-11 (×12): 2.5 mg via RESPIRATORY_TRACT
  Filled 2013-07-08 (×14): qty 0.5

## 2013-07-08 MED ORDER — FUROSEMIDE 10 MG/ML IJ SOLN
40.0000 mg | Freq: Two times a day (BID) | INTRAMUSCULAR | Status: DC
  Administered 2013-07-09: 40 mg via INTRAVENOUS
  Filled 2013-07-08 (×3): qty 4

## 2013-07-08 MED ORDER — FAMOTIDINE 20 MG PO TABS
20.0000 mg | ORAL_TABLET | Freq: Two times a day (BID) | ORAL | Status: DC
  Administered 2013-07-08 (×2): 20 mg via ORAL
  Filled 2013-07-08 (×3): qty 1

## 2013-07-08 MED ORDER — BUDESONIDE 0.25 MG/2ML IN SUSP
0.2500 mg | Freq: Two times a day (BID) | RESPIRATORY_TRACT | Status: DC
  Administered 2013-07-08 – 2013-07-11 (×6): 0.25 mg via RESPIRATORY_TRACT
  Filled 2013-07-08 (×12): qty 2

## 2013-07-08 MED ORDER — CLOPIDOGREL BISULFATE 75 MG PO TABS
75.0000 mg | ORAL_TABLET | Freq: Every day | ORAL | Status: DC
  Administered 2013-07-08 – 2013-07-09 (×2): 75 mg via ORAL
  Filled 2013-07-08 (×3): qty 1

## 2013-07-08 MED ORDER — ACETAMINOPHEN 325 MG PO TABS
650.0000 mg | ORAL_TABLET | Freq: Four times a day (QID) | ORAL | Status: DC | PRN
  Administered 2013-07-09: 650 mg via ORAL
  Filled 2013-07-08: qty 2

## 2013-07-08 MED ORDER — ISOSORBIDE MONONITRATE ER 60 MG PO TB24
60.0000 mg | ORAL_TABLET | Freq: Every day | ORAL | Status: DC
  Administered 2013-07-08 – 2013-07-11 (×3): 60 mg via ORAL
  Filled 2013-07-08 (×5): qty 1

## 2013-07-08 MED ORDER — FERROUS SULFATE 325 (65 FE) MG PO TABS
325.0000 mg | ORAL_TABLET | Freq: Every day | ORAL | Status: DC
  Administered 2013-07-08 – 2013-07-11 (×4): 325 mg via ORAL
  Filled 2013-07-08 (×6): qty 1

## 2013-07-08 MED ORDER — IPRATROPIUM BROMIDE 0.02 % IN SOLN
0.5000 mg | Freq: Four times a day (QID) | RESPIRATORY_TRACT | Status: DC
  Administered 2013-07-08 – 2013-07-11 (×12): 0.5 mg via RESPIRATORY_TRACT
  Filled 2013-07-08 (×14): qty 2.5

## 2013-07-08 MED ORDER — NITROGLYCERIN 0.4 MG SL SUBL: 0.4000 mg | SUBLINGUAL_TABLET | SUBLINGUAL | Status: DC | PRN

## 2013-07-08 MED ORDER — POTASSIUM CHLORIDE 20 MEQ PO PACK
20.0000 meq | PACK | Freq: Two times a day (BID) | ORAL | Status: DC
  Filled 2013-07-08: qty 1

## 2013-07-08 MED ORDER — SIMVASTATIN 10 MG PO TABS
10.0000 mg | ORAL_TABLET | Freq: Every day | ORAL | Status: DC
  Administered 2013-07-08 – 2013-07-09 (×2): 10 mg via ORAL
  Filled 2013-07-08 (×5): qty 1

## 2013-07-08 MED ORDER — POTASSIUM CHLORIDE CRYS ER 20 MEQ PO TBCR
20.0000 meq | EXTENDED_RELEASE_TABLET | Freq: Two times a day (BID) | ORAL | Status: DC
  Administered 2013-07-08 – 2013-07-11 (×7): 20 meq via ORAL
  Filled 2013-07-08 (×10): qty 1

## 2013-07-08 MED ORDER — FUROSEMIDE 10 MG/ML IJ SOLN: 80.0000 mg | Freq: Two times a day (BID) | INTRAMUSCULAR | Status: DC

## 2013-07-08 MED ORDER — SODIUM CHLORIDE 0.9 % IJ SOLN
3.0000 mL | Freq: Two times a day (BID) | INTRAMUSCULAR | Status: DC
  Administered 2013-07-08 – 2013-07-11 (×3): 3 mL via INTRAVENOUS

## 2013-07-08 MED ORDER — HEPARIN BOLUS VIA INFUSION
3000.0000 [IU] | Freq: Once | INTRAVENOUS | Status: AC
  Administered 2013-07-08: 3000 [IU] via INTRAVENOUS
  Filled 2013-07-08: qty 3000

## 2013-07-08 MED ORDER — PANTOPRAZOLE SODIUM 40 MG PO TBEC
40.0000 mg | DELAYED_RELEASE_TABLET | Freq: Every day | ORAL | Status: DC
  Administered 2013-07-08 – 2013-07-11 (×3): 40 mg via ORAL
  Filled 2013-07-08 (×2): qty 1

## 2013-07-08 MED ORDER — ALBUTEROL SULFATE (5 MG/ML) 0.5% IN NEBU: 2.5000 mg | INHALATION_SOLUTION | RESPIRATORY_TRACT | Status: DC | PRN

## 2013-07-08 MED ORDER — ONDANSETRON HCL 4 MG PO TABS: 4.0000 mg | ORAL_TABLET | Freq: Four times a day (QID) | ORAL | Status: DC | PRN

## 2013-07-08 MED ORDER — HEPARIN (PORCINE) IN NACL 100-0.45 UNIT/ML-% IJ SOLN
950.0000 [IU]/h | INTRAMUSCULAR | Status: DC
  Administered 2013-07-08 (×2): 950 [IU]/h via INTRAVENOUS
  Administered 2013-07-08: 1000 [IU]/h via INTRAVENOUS
  Administered 2013-07-10: 950 [IU]/h via INTRAVENOUS
  Filled 2013-07-08 (×5): qty 250

## 2013-07-08 MED ORDER — DEXTROSE 5 % IV SOLN: 1.0000 g | INTRAVENOUS | Status: DC

## 2013-07-08 MED ORDER — SODIUM CHLORIDE 0.9 % IJ SOLN: 3.0000 mL | Freq: Two times a day (BID) | INTRAMUSCULAR | Status: DC

## 2013-07-08 MED ORDER — FUROSEMIDE 10 MG/ML IJ SOLN
40.0000 mg | Freq: Two times a day (BID) | INTRAMUSCULAR | Status: DC
  Administered 2013-07-08: 40 mg via INTRAVENOUS
  Filled 2013-07-08 (×3): qty 4

## 2013-07-08 MED ORDER — ALUM & MAG HYDROXIDE-SIMETH 200-200-20 MG/5ML PO SUSP
15.0000 mL | ORAL | Status: DC | PRN
  Administered 2013-07-11: 30 mL via ORAL
  Filled 2013-07-08: qty 30

## 2013-07-08 MED ORDER — ACETAMINOPHEN 650 MG RE SUPP: 650.0000 mg | Freq: Four times a day (QID) | RECTAL | Status: DC | PRN

## 2013-07-08 MED ORDER — ASPIRIN EC 325 MG PO TBEC
325.0000 mg | DELAYED_RELEASE_TABLET | Freq: Every day | ORAL | Status: DC
  Administered 2013-07-08 – 2013-07-09 (×2): 325 mg via ORAL
  Filled 2013-07-08 (×3): qty 1

## 2013-07-08 MED ORDER — DEXTROSE 5 % IV SOLN
1.0000 g | Freq: Once | INTRAVENOUS | Status: AC
  Administered 2013-07-08: 1 g via INTRAVENOUS
  Filled 2013-07-08: qty 1

## 2013-07-08 MED ORDER — VANCOMYCIN HCL IN DEXTROSE 750-5 MG/150ML-% IV SOLN
750.0000 mg | INTRAVENOUS | Status: DC
  Filled 2013-07-08: qty 150

## 2013-07-08 MED ORDER — FAMOTIDINE 20 MG PO TABS
20.0000 mg | ORAL_TABLET | Freq: Two times a day (BID) | ORAL | Status: DC
  Administered 2013-07-08 – 2013-07-11 (×5): 20 mg via ORAL
  Filled 2013-07-08 (×8): qty 1

## 2013-07-08 MED ORDER — METOPROLOL TARTRATE 25 MG PO TABS
25.0000 mg | ORAL_TABLET | Freq: Two times a day (BID) | ORAL | Status: DC
  Administered 2013-07-08 – 2013-07-11 (×7): 25 mg via ORAL
  Filled 2013-07-08 (×10): qty 1

## 2013-07-08 MED ORDER — ONDANSETRON HCL 4 MG/2ML IJ SOLN
4.0000 mg | Freq: Four times a day (QID) | INTRAMUSCULAR | Status: DC | PRN
  Administered 2013-07-09: 4 mg via INTRAVENOUS
  Filled 2013-07-08: qty 2

## 2013-07-08 NOTE — Progress Notes (Signed)
ANTICOAGULATION/ANTIBIOTIC CONSULT NOTE - Initial Consult  Pharmacy Consult for heparin and vancomycin/cefepime Indication: chest pain/ACS and r/o PNA  Allergies  Allergen Reactions  . Sulfa Antibiotics Other (See Comments)    unknown    Patient Measurements: Height: 5\' 2"  (157.5 cm) Weight: 156 lb 8.4 oz (70.999 kg) IBW/kg (Calculated) : 50.1 Heparin Dosing Weight: 65kg  Vital Signs: Temp: 97.7 F (36.5 C) (08/31 2003) Temp src: Axillary (08/31 2003) BP: 113/60 mmHg (08/31 2330) Pulse Rate: 95 (08/31 2330)  Labs:  Recent Labs  07/05/13 0440 07/06/13 0525 07/07/13 2038 07/07/13 2047  HGB 12.0 11.7* 12.9 12.9  HCT 37.4 35.5* 38.2 38.0  PLT 190 185 210  --   HEPARINUNFRC 0.53 <0.10*  --   --   CREATININE 1.42*  --   --  1.50*    Estimated Creatinine Clearance: 25.3 ml/min (by C-G formula based on Cr of 1.5).   Medical History: Past Medical History  Diagnosis Date  . DVT (deep venous thrombosis)   . Acid reflux   . Hypertension   . Hypercholesterolemia   . Anginal pain   . Asthma   . Hyponatremia   . Congestive heart failure   . PONV (postoperative nausea and vomiting)   . Myocardial infarction 2014; 07/03/2013  . COPD (chronic obstructive pulmonary disease)   . Pneumonia     "as a child and again in 2013" (07/04/2013)  . Chronic bronchitis     "once or twice/yr" (07/04/2013)  . Exertional shortness of breath   . Pernicious anemia   . History of blood transfusion     "cause my blood was low" (07/04/2013)  . ZOXWRUEA(540.9)     "probably weekly" (07/04/2013)  . Acoustic neuroma     "Dr. Suzanna Obey is testing for this in her right ear right now" (07/04/2013)  . Deafness in right ear   . Arthritis     "left knee; back" (07/04/2013)  . Lumbar compression fracture 2010; 2012    "fell; fell" (07/04/2013)  . Anxiety   . Chronic cystitis     Medications:  Prescriptions prior to admission  Medication Sig Dispense Refill  . acetaminophen (TYLENOL) 325 MG  tablet Take 650 mg by mouth every 6 (six) hours as needed for pain.      Marland Kitchen albuterol (PROVENTIL HFA;VENTOLIN HFA) 108 (90 BASE) MCG/ACT inhaler Inhale 2 puffs into the lungs every 6 (six) hours as needed for wheezing.      Marland Kitchen albuterol (PROVENTIL) (5 MG/ML) 0.5% nebulizer solution Take 0.5 mLs (2.5 mg total) by nebulization 3 (three) times daily.  20 mL    . aspirin EC 81 MG tablet Take 81 mg by mouth daily.      . Cholecalciferol (VITAMIN D) 1000 UNITS capsule Take 1,000 Units by mouth daily.      . clopidogrel (PLAVIX) 75 MG tablet Take 75 mg by mouth daily.      . ferrous sulfate 325 (65 FE) MG tablet Take 325 mg by mouth daily with breakfast.        . Fluticasone-Salmeterol (ADVAIR) 100-50 MCG/DOSE AEPB Inhale 1 puff into the lungs every 12 (twelve) hours.      . furosemide (LASIX) 40 MG tablet Take 1 tablet (40 mg total) by mouth daily.  30 tablet    . isosorbide mononitrate (IMDUR) 30 MG 24 hr tablet Take 2 tablets (60 mg total) by mouth daily.  30 tablet  11  . metoprolol tartrate (LOPRESSOR) 25 MG tablet Take 25 mg by  mouth 2 (two) times daily.      . nitroGLYCERIN (NITROSTAT) 0.4 MG SL tablet Place 0.4 mg under the tongue every 5 (five) minutes as needed.        . potassium chloride (KLOR-CON) 20 MEQ packet Take 20 mEq by mouth 2 (two) times daily.      . pravastatin (PRAVACHOL) 20 MG tablet Take 20 mg by mouth daily.      . ranitidine (ZANTAC) 150 MG tablet Take 150 mg by mouth 2 (two) times daily.        Marland Kitchen tiotropium (SPIRIVA) 18 MCG inhalation capsule Place 18 mcg into inhaler and inhale daily.      . pantoprazole (PROTONIX) 40 MG tablet Take 40 mg by mouth daily.       Scheduled:  . albuterol  2.5 mg Nebulization Q6H  . aspirin EC  325 mg Oral Daily  . budesonide (PULMICORT) nebulizer solution  0.25 mg Nebulization BID  . clopidogrel  75 mg Oral Daily  . famotidine  20 mg Oral BID  . ferrous sulfate  325 mg Oral Q breakfast  . furosemide  40 mg Intravenous Q12H  . ipratropium   0.5 mg Nebulization Q6H  . isosorbide mononitrate  60 mg Oral Daily  . metoprolol tartrate  25 mg Oral BID  . pantoprazole  40 mg Oral Daily  . potassium chloride  20 mEq Oral BID  . simvastatin  10 mg Oral q1800  . sodium chloride  3 mL Intravenous Q12H  . sodium chloride  3 mL Intravenous Q12H    Assessment: 79yo female presents to ED via EMS w/ respiratory distress after being discharged day prior for MI/PNA, vomited while wearing CPAP, initial troponin elevated, to begin heparin and IV ABX.  Goal of Therapy:  Heparin level 0.3-0.7 units/ml Vancomycin trough 15-20 Monitor platelets by anticoagulation protocol: Yes   Plan:  Was recently therapeutic on heparin at 1000 units/hr so will give bolus of 3000 units and begin gtt at 1000 units/hr and monitor heparin levels and CBC; rec'd vanc 1g in ED, will continue with vancomycin 750mg  IV Q24H and cefepime 1g IV Q24H and monitor CBC, Cx, levels prn.  Vernard Gambles, PharmD, BCPS  07/08/2013,12:18 AM

## 2013-07-08 NOTE — Progress Notes (Signed)
Noted b/p  88/50 and recheck b/p 101/59/  Has order for Lasix 80mg  Iv. Dr. Sharon Seller advised and orders given.

## 2013-07-08 NOTE — Progress Notes (Signed)
[facility] Progress Note [place] TEAM 1 - Stepdown/ICU TEAM   [name]:[id number] DOB: [birth year] DOA: 07/07/2013 PCP: [name], MD  Admit HPI / Brief Narrative: 79 y.o. female who was discharged home 8/30 after being treated medically for non-ST elevation MI presented to the ER because of shortness of breath. Patient states that this evening around 5 PM patient became acutely short of breath and started having epigastric chest and left arm discomfort. Patient also had episode of nausea and vomiting. Patient was brought to the ER and was found to be acutely short of breath and has been placed on 100% nonrebreather. Chest x-ray was showing asymmetric edema concerning for CHF versus pneumonia. EKG were showing nonspecific findings and troponins are elevated. On-call cardiologist Dr.Aitsebaomo was consulted and at this time requested hospitalist admission. Presently patient had received Lasix and antibiotics and had good diuresis. Patient presently feels better. Denies any chest pain at this time. Patient denies any abdominal pain fever chills has had mild productive cough. On exam patient has bilateral wheeze and crepitations. Denies any diarrhea.  Assessment/Plan:  Acute respiratory failure due to acute on chronic diastolic heart failure exacerbated by ischemia Has improved w/ diuresis - follow Is/Os and daily weights - EF was 40-50% via TTE April 2014 w/ grade 2 DD - consider repeat TTE unless cardiac cath completed  Non-ST elevation MI Medical tx for now - Cardiology following - possible cardiac cath  COPD Well compensated at present   Hyperlipidemia continue statins  HTN Reasonably controlled at this time - cont to follow   Chronic kidney disease Baseline appears to be GFR ~50-60 / crt ~ 1.0-1.2 - watch w/ diuresis   Hyperglycemia hemoglobin A1c was 5.8 this month  History of peripheral arterial disease status post bypass  Hx of DVT Not on chronic  anticoag   Code Status: FULL Family Communication: spoke w/ pt and daughter at bedside Disposition Plan: SDU  Consultants: [facility]  Procedures: none  Antibiotics: none  DVT prophylaxis: IV heparin   HPI/Subjective: Pt is much less sob after diuresis.  C/o chronic indigestion.  Denis SSCP, n/v, or abdom pain.     Objective: Blood pressure 106/64, pulse 91, temperature 98.1 F (36.7 C), temperature source Oral, resp. rate 21, height 5\' 2"  (1.575 m), weight 69.3 kg (152 lb 12.5 oz), SpO2 100.00%.  Intake/Output Summary (Last 24 hours) at 07/08/13 1152 Last data filed at 07/08/13 0600  Gross per 24 hour  Intake  51.83 ml  Output      0 ml  Net  51.83 ml   Exam: General: No acute respiratory distress Lungs: mild bibasilar crackles - no wheeze  Cardiovascular: Regular rate and rhythm without murmur gallop or rub normal S1 and S2 Abdomen: Nontender, nondistended, soft, bowel sounds positive, no rebound, no ascites, no appreciable mass Extremities: No significant cyanosis, clubbing, or edema bilateral lower extremities  Data Reviewed: Basic Metabolic Panel:  Recent Labs Lab 07/04/13 0019 07/04/13 0810 07/05/13 0440 07/07/13 2047 07/08/13 0600  NA 135 138 138 136 136  K 4.5 3.7 4.6 4.0 4.4  CL 100 100 101 105 98  CO2 19 26 21   --  27  GLUCOSE 141* 100* 112* 233* 120*  BUN 22 22 20  25* 25*  CREATININE 1.12* 1.12* 1.42* 1.50* 1.39*  CALCIUM 10.3 10.3 10.5  --  10.7*  MG  --  2.5  --   --   --    Liver Function Tests:  Recent Labs  Lab 07/04/13 0810 07/08/13 0053 07/08/13 0600  AST 22 38* 49*  ALT 11 15 16   ALKPHOS 89 80 79  BILITOT 0.3 0.5 0.5  PROT 6.4 6.3 6.3  ALBUMIN 3.7 3.4* 3.5    Recent Labs Lab 07/08/13 0053  LIPASE 32   CBC:  Recent Labs Lab 07/05/13 0440 07/06/13 0525 07/07/13 2038 07/07/13 2047 07/08/13 0600 07/08/13 0850  WBC 5.6 4.0 12.0*  --  8.3 8.3  NEUTROABS  --   --   --   --  6.3  --   HGB 12.0 11.7* 12.9 12.9  11.5* 12.3  HCT 37.4 35.5* 38.2 38.0 35.1* 36.6  MCV 88.2 87.9 88.6  --  88.2 88.4  PLT 190 185 210  --  199 205   Cardiac Enzymes:  Recent Labs Lab 07/04/13 0810 07/04/13 1455 07/04/13 2320 07/08/13 0053 07/08/13 0600  TROPONINI 0.84* 1.49* 1.39* 3.53* 7.85*   BNP (last 3 results)  Recent Labs  07/04/13 0019 07/04/13 0810 07/07/13 2038  PROBNP 748.2* 1436.0* 1731.0*   CBG: No results found for this basename: GLUCAP,  in the last 168 hours  Recent Results (from the past 240 hour(s))  MRSA PCR SCREENING     Status: None   Collection Time    07/04/13  3:44 AM      Result Value Range Status   MRSA by PCR NEGATIVE  NEGATIVE Final   Comment:            The GeneXpert MRSA Assay (FDA     approved for NASAL specimens     only), is one component of a     comprehensive MRSA colonization     surveillance program. It is not     intended to diagnose MRSA     infection nor to guide or     monitor treatment for     MRSA infections.  MRSA PCR SCREENING     Status: None   Collection Time    07/08/13 12:03 AM      Result Value Range Status   MRSA by PCR NEGATIVE  NEGATIVE Final   Comment:            The GeneXpert MRSA Assay (FDA     approved for NASAL specimens     only), is one component of a     comprehensive MRSA colonization     surveillance program. It is not     intended to diagnose MRSA     infection nor to guide or     monitor treatment for     MRSA infections.     Studies:  Recent x-ray studies have been reviewed in detail by the Attending Physician  Scheduled Meds:  Scheduled Meds: . albuterol  2.5 mg Nebulization Q6H  . aspirin EC  325 mg Oral Daily  . budesonide (PULMICORT) nebulizer solution  0.25 mg Nebulization BID  . [START ON 07/09/2013] ceFEPime (MAXIPIME) IV  1 g Intravenous Q24H  . clopidogrel  75 mg Oral Daily  . famotidine  20 mg Oral BID  . ferrous sulfate  325 mg Oral Q breakfast  . furosemide  80 mg Intravenous Q12H  . ipratropium  0.5  mg Nebulization Q6H  . isosorbide mononitrate  60 mg Oral Daily  . metoprolol tartrate  25 mg Oral BID  . pantoprazole  40 mg Oral Daily  . potassium chloride SA  20 mEq Oral BID  . simvastatin  10 mg Oral q1800  . sodium chloride  3 mL Intravenous Q12H  . sodium chloride  3 mL Intravenous Q12H  . vancomycin  750 mg Intravenous Q24H    Time spent on care of this patient: 35 mins   [facility] T  [facility] Office  [phone] Pager - Text Page per Loretha Stapler as per below:  On-Call/Text Page:      [email or web address]      password [email or web address]  If 7PM-7AM, please contact night-coverage [email or web address] Password [email or web address] 07/08/2013, 11:52 AM   LOS: 1 day

## 2013-07-08 NOTE — Progress Notes (Signed)
Dr. Sharon Seller paged regarding trop of 7.85. Order placed for trop to be done q 6hrs.

## 2013-07-08 NOTE — Progress Notes (Signed)
UR Completed.  [name] Jane [phone] 07/08/2013  

## 2013-07-08 NOTE — Progress Notes (Signed)
2200 Maren Reamer NP notified of B/P 100-104 /58-68. Pt due for Lopressor 25mg  PO 2210 Rec'd return call from Maren Reamer NP. B/P now 109/58 Instructed to administer Lopressor.

## 2013-07-08 NOTE — Progress Notes (Addendum)
ANTICOAGULATION CONSULT NOTE - Follow Up Consult  Pharmacy Consult for heparin Indication: chest pain/ACS  Allergies  Allergen Reactions  . Sulfa Antibiotics Other (See Comments)    unknown    Patient Measurements: Height: 5\' 2"  (157.5 cm) Weight: 152 lb 12.5 oz (69.3 kg) IBW/kg (Calculated) : 50.1 Heparin Dosing Weight:    Vital Signs: Temp: 98.1 F (36.7 C) (09/01 0806) Temp src: Oral (09/01 0806) BP: 106/64 mmHg (09/01 0943) Pulse Rate: 91 (09/01 0943)  Labs:  Recent Labs  07/06/13 0525 07/07/13 2038 07/07/13 2047 07/08/13 0053 07/08/13 0600 07/08/13 0850  HGB 11.7* 12.9 12.9  --  11.5* 12.3  HCT 35.5* 38.2 38.0  --  35.1* 36.6  PLT 185 210  --   --  199 205  LABPROT  --   --   --   --  12.9  --   INR  --   --   --   --  0.99  --   HEPARINUNFRC <0.10*  --   --   --   --  0.79*  CREATININE  --   --  1.50*  --  1.39*  --   TROPONINI  --   --   --  3.53* 7.85*  --     Estimated Creatinine Clearance: 27 ml/min (by C-G formula based on Cr of 1.39).  Assessment: 79yo female presents to ED via EMS w/ respiratory distress after being discharged day prior for MI/PNA, vomited while wearing CPAP, initial troponin elevated. Chest x-ray was showing asymmetric edema concerning for CHF versus pneumonia  Anticoagulation: h/o DVT, elevated troponins. Heparin level 0.79 slightly greater than goal may be due to residual bolus effect.  Infectious Disease: Respiratory distress/PNA. Afebrile. WBC 8.3. Scr 1.39 with estimated CrC. 27. Vanco 9/1>> Cefepime 9/1>>  Cardiovascular: HTN, HLD, CHF, CAD, NSTEMI. VSS Meds: ASA 325mg , Plavix, IV Lasix, Imdur, metoprolol, K+, Zocor   Gastrointestinal / Nutrition: gERD on po PPI  Neurology: anxiety, chronic pain from arthritis & lumbar compression fx.  Nephrology: chronic cystitis. CrCl 27.  Pulmonary: asthma, COPD, chronic bronchitis on albuterol/atroven/budesonide  Hematology / Oncology: pernicious anemia on FESO4  PTA  Medication Issues: Vit D, Advair,    Goal of Therapy:  Heparin level 0.3-0.7 units/ml Monitor platelets by anticoagulation protocol: Yes   Plan:  Decrease heparin to 950 units/hr and recheck in 6 hrs.   Crystal S. Merilynn Finland, PharmD, BCPS Clinical Staff Pharmacist Pager [phone]  Misty Stanley Stillinger 07/08/2013,10:06 AM  Addendum:  Anti-Xa level is rechecked this evening at 1700 (6.5 hrs after rate decrease). No bleeding noted per chart  Plan: Continue heparin infusion at current rate, we will f/u AM anti-xa level and cbc.  Bayard Hugger, PharmD, BCPS  Clinical Pharmacist  Pager: [phone]

## 2013-07-08 NOTE — Progress Notes (Addendum)
Patient Name: [name] Date of Encounter: 07/08/2013    SUBJECTIVE: Dr. Sharon Seller notified me of the patient's presence. The patient feels fine this morning. She developed recurrent severe chest pain that led to admission. This was more severe than on the prior admission. He was consensus that her age conservative medical therapy would be attempted rather than invasive approach.  She returns now with a significant increase in troponin.  Currently pain free.  TELEMETRY:  NSR: Filed Vitals:   07/08/13 0400 07/08/13 0500 07/08/13 0806 07/08/13 0943  BP: 122/60 116/57 156/131 106/64  Pulse: 82 76 83 91  Temp:   98.1 F (36.7 C)   TempSrc:   Oral   Resp: 14 14 21    Height:      Weight:      SpO2: 97% 99% 100%     Intake/Output Summary (Last 24 hours) at 07/08/13 1014 Last data filed at 07/08/13 0600  Gross per 24 hour  Intake  51.83 ml  Output      0 ml  Net  51.83 ml    LABS: Basic Metabolic Panel:  Recent Labs  47/82/95 2047 07/08/13 0600  NA 136 136  K 4.0 4.4  CL 105 98  CO2  --  27  GLUCOSE 233* 120*  BUN 25* 25*  CREATININE 1.50* 1.39*  CALCIUM  --  10.7*   CBC:  Recent Labs  07/08/13 0600 07/08/13 0850  WBC 8.3 8.3  NEUTROABS 6.3  --   HGB 11.5* 12.3  HCT 35.1* 36.6  MCV 88.2 88.4  PLT 199 205   BNP (last 3 results)  Recent Labs  07/04/13 0019 07/04/13 0810 07/07/13 2038  PROBNP 748.2* 1436.0* 1731.0*   Cardiac Enzymes: Results for PALIN, TRISTAN (MRN [id number]) as of 07/08/2013 10:14  Ref. Range 07/04/2013 23:20 07/07/2013 20:38 07/07/2013 20:45 07/08/2013 00:53 07/08/2013 06:00  Troponin I Latest Range: <0.30 ng/mL 1.39 (HH)   3.53 (HH) 7.85 (HH)   Radiology/Studies:  Study Result    *RADIOLOGY REPORT*  Clinical Data: Short of breath  PORTABLE CHEST - 1 VIEW  Comparison: 07/04/2013  Findings: Mild cardiac enlargement. Asymmetric airspace disease on  the right with fluid in the right minor fissure. This may  represent fluid  overload and edema. Pneumonia also possible.  Small left effusion is suspected. There is underlying chronic lung  disease.  IMPRESSION:  Asymmetric airspace disease on the right. This may represent edema  or pneumonia.  Original Report Authenticated By: Janeece Riggers, M.D.   ECG: Not repeated since the prior admission. Poor R wave progression was noted.  Physical Exam: Blood pressure 106/64, pulse 91, temperature 98.1 F (36.7 C), temperature source Oral, resp. rate 21, height 5\' 2"  (1.575 m), weight 69.3 kg (152 lb 12.5 oz), SpO2 100.00%. Weight change:     rales in the bases.  Cardiac exam reveals regular rhythm. One of 6 systolic murmur.  Abdomen is soft.  Extremities no edema.   ASSESSMENT:  1. Non-ST elevation myocardial infarction  2. Acute on chronic diastolic heart failure exacerbated by ischemia  3. COPD  4. Chronic kidney disease   5. I doubt PNA   Plan:  1. Aggressive diuresis  2. Anticoagulation  3. We'll keep patient n.p.o. and discussed with Dr. Eldridge Dace about possible invasive approach  Greater than 40 minutes spent with patient and reviewing data Signed, Lesleigh Noe 07/08/2013, 10:14 AM

## 2013-07-09 LAB — BASIC METABOLIC PANEL
CO2: 27 mEq/L (ref 19–32)
Calcium: 10.1 mg/dL (ref 8.4–10.5)
Creatinine, Ser: 1.5 mg/dL — ABNORMAL HIGH (ref 0.50–1.10)
GFR calc non Af Amer: 31 mL/min — ABNORMAL LOW (ref >90)
Glucose, Bld: 113 mg/dL — ABNORMAL HIGH (ref 70–99)

## 2013-07-09 LAB — CBC
MCH: 28.9 pg (ref 26.0–34.0)
MCHC: 32.6 g/dL (ref 30.0–36.0)
MCV: 88.6 fL (ref 78.0–100.0)
Platelets: 181 10*3/uL (ref 150–400)
RBC: 3.6 MIL/uL — ABNORMAL LOW (ref 3.87–5.11)

## 2013-07-09 MED ORDER — ONDANSETRON HCL 4 MG/2ML IJ SOLN: 4.0000 mg | Freq: Four times a day (QID) | INTRAMUSCULAR | Status: DC | PRN

## 2013-07-09 MED ORDER — SODIUM CHLORIDE 0.9 % IJ SOLN: 3.0000 mL | INTRAMUSCULAR | Status: DC | PRN

## 2013-07-09 MED ORDER — DIAZEPAM 2 MG PO TABS: 2.0000 mg | ORAL_TABLET | ORAL | Status: DC

## 2013-07-09 MED ORDER — SODIUM CHLORIDE 0.9 % IV SOLN: INTRAVENOUS | Status: DC

## 2013-07-09 MED ORDER — SODIUM CHLORIDE 0.9 % IJ SOLN: 3.0000 mL | Freq: Two times a day (BID) | INTRAMUSCULAR | Status: DC

## 2013-07-09 MED ORDER — ACETAMINOPHEN 325 MG PO TABS: 650.0000 mg | ORAL_TABLET | ORAL | Status: DC | PRN

## 2013-07-09 MED ORDER — SODIUM CHLORIDE 0.9 % IV SOLN: 250.0000 mL | INTRAVENOUS | Status: DC | PRN

## 2013-07-09 MED ORDER — ASPIRIN 81 MG PO CHEW
324.0000 mg | CHEWABLE_TABLET | ORAL | Status: AC
  Administered 2013-07-10: 324 mg via ORAL
  Filled 2013-07-09: qty 4

## 2013-07-09 NOTE — Progress Notes (Signed)
Spoke with Dr Isabel Caprice to ask if he will take over care on this patient. He has kindly agreed. COPD is stable, cont Spiriva and Nebs. Follows with Dr Shelle Iron. Consult pulm if any issues.   Calvert Cantor, MD

## 2013-07-09 NOTE — Progress Notes (Signed)
Pharmacist Heart Failure Core Measure Documentation  Assessment: [name] has an EF documented as 40-50% on 02/07/13 by ECHO.  Rationale: Heart failure patients with left ventricular systolic dysfunction (LVSD) and an EF < 40% should be prescribed an angiotensin converting enzyme inhibitor (ACEI) or angiotensin receptor blocker (ARB) at discharge unless a contraindication is documented in the medical record.  This patient is not currently on an ACEI or ARB for HF.  This note is being placed in the record in order to provide documentation that a contraindication to the use of these agents is present for this encounter.  ACE Inhibitor or Angiotensin Receptor Blocker is contraindicated (specify all that apply)  []   ACEI allergy AND ARB allergy []   Angioedema []   Moderate or severe aortic stenosis []   Hyperkalemia []   Hypotension []   Renal artery stenosis [x]   Worsening renal function, preexisting renal disease or dysfunction   Gardner Candle 07/09/2013 1:49 PM

## 2013-07-09 NOTE — ED Provider Notes (Signed)
I saw and evaluated the patient, reviewed the resident's note and I agree with the findings and plan. Pt with resp distress, increased WOB, improved after CPAP, no on oxygen.  Still mild increased WOB.  Possible pneumonia, will tx with abx, elevated troponin.  Admitted to hospitalist, cardiology notified.  CRITICAL CARE Performed by: Fern Canova Total critical care time: 45 Critical care time was exclusive of separately billable procedures and treating other patients. Critical care was necessary to treat or prevent imminent or life-threatening deterioration. Critical care was time spent personally by me on the following activities: development of treatment plan with patient and/or surrogate as well as nursing, discussions with consultants, evaluation of patient's response to treatment, examination of patient, obtaining history from patient or surrogate, ordering and performing treatments and interventions, ordering and review of laboratory studies, ordering and review of radiographic studies, pulse oximetry and re-evaluation of patient's condition.   Rolan Bucco, MD 07/09/13 1406

## 2013-07-09 NOTE — Progress Notes (Signed)
ANTICOAGULATION CONSULT NOTE - Follow Up Consult  Pharmacy Consult for heparin Indication: chest pain/ACS  Allergies  Allergen Reactions  . Sulfa Antibiotics Other (See Comments)    unknown    Patient Measurements: Height: 5\' 2"  (157.5 cm) Weight: 152 lb 12.5 oz (69.3 kg) IBW/kg (Calculated) : 50.1 Heparin Dosing Weight:    Vital Signs: Temp: 98.3 F (36.8 C) (09/02 0700) Temp src: Axillary (09/02 0700) BP: 109/56 mmHg (09/02 0700) Pulse Rate: 90 (09/02 0700)  Labs:  Recent Labs  07/07/13 2047  07/08/13 0600 07/08/13 0850 07/08/13 1200 07/08/13 1700 07/09/13 0045  HGB 12.9  --  11.5* 12.3  --   --  10.4*  HCT 38.0  --  35.1* 36.6  --   --  31.9*  PLT  --   --  199 205  --   --  181  LABPROT  --   --  12.9  --   --   --   --   INR  --   --  0.99  --   --   --   --   HEPARINUNFRC  --   --   --  0.79*  --  0.43 0.47  CREATININE 1.50*  --  1.39*  --   --   --  1.50*  TROPONINI  --   < > 7.85*  --  8.72* 8.82* 7.07*  < > = values in this interval not displayed.  Estimated Creatinine Clearance: 25 ml/min (by C-G formula based on Cr of 1.5).  Assessment: 79yo female presents to ED via EMS w/ respiratory distress after being discharged day prior for MI/PNA, vomited while wearing CPAP, initial troponin elevated. Chest x-ray was showing asymmetric edema concerning for CHF versus pneumonia  Anticoagulation: h/o DVT, elevated troponins. Heparin level therapeutic on 950 units/hr.  No bleeding or complications noted, CBC with slight trend down.  Awaiting cath.  Goal of Therapy:  Heparin level 0.3-0.7 units/ml Monitor platelets by anticoagulation protocol: Yes   Plan:  Continue IV heparin at current rate. Continue daily heparin level and CBC. F/u plans for anticoagulation after cath lab.  Tad Moore, BCPS  Clinical Pharmacist Pager [phone]  07/09/2013 9:48 AM

## 2013-07-09 NOTE — Progress Notes (Signed)
Patient Name: [name] Date of Encounter: 07/09/2013    SUBJECTIVE:  She developed recurrent severe chest pain that led to admission.  It was more severe than on the prior admission. The was consensus that her age conservative medical therapy would be attempted rather than invasive approach last week  She returned now with a significant increase in troponin, after coming in with CHF last week.  Currently pain free. Breathing better.  TELEMETRY:  NSR: Filed Vitals:   07/09/13 0500 07/09/13 0600 07/09/13 0700 07/09/13 0744  BP: 118/56 134/63 109/56   Pulse: 83 89 90   Temp:   98.3 F (36.8 C)   TempSrc:   Axillary   Resp: 22 26 27    Height:      Weight:      SpO2: 97% 99% 100% 100%    Intake/Output Summary (Last 24 hours) at 07/09/13 0916 Last data filed at 07/09/13 0746  Gross per 24 hour  Intake 358.18 ml  Output   3075 ml  Net -2716.82 ml    LABS: Basic Metabolic Panel:  Recent Labs  16/10/96 0600 07/09/13 0045  NA 136 134*  K 4.4 4.5  CL 98 99  CO2 27 27  GLUCOSE 120* 113*  BUN 25* 29*  CREATININE 1.39* 1.50*  CALCIUM 10.7* 10.1   CBC:  Recent Labs  07/08/13 0600 07/08/13 0850 07/09/13 0045  WBC 8.3 8.3 6.5  NEUTROABS 6.3  --   --   HGB 11.5* 12.3 10.4*  HCT 35.1* 36.6 31.9*  MCV 88.2 88.4 88.6  PLT 199 205 181   BNP (last 3 results)  Recent Labs  07/04/13 0019 07/04/13 0810 07/07/13 2038  PROBNP 748.2* 1436.0* 1731.0*   Cardiac Enzymes: Results for [name], [name] (MRN [id number]) as of 07/08/2013 10:14  Ref. Range 07/04/2013 23:20 07/07/2013 20:38 07/07/2013 20:45 07/08/2013 00:53 07/08/2013 06:00  Troponin I Latest Range: <0.30 ng/mL 1.39 (HH)   3.53 (HH) 7.85 (HH)   Radiology/Studies:  Study Result    *RADIOLOGY REPORT*  Clinical Data: Short of breath  PORTABLE CHEST - 1 VIEW  Comparison: 07/04/2013  Findings: Mild cardiac enlargement. Asymmetric airspace disease on  the right with fluid in the right minor fissure. This may   represent fluid overload and edema. Pneumonia also possible.  Small left effusion is suspected. There is underlying chronic lung  disease.  IMPRESSION:  Asymmetric airspace disease on the right. This may represent edema  or pneumonia.  Original Report Authenticated By: Janeece Riggers, M.D.   ECG: Not repeated since the prior admission. Poor R wave progression was noted.  Physical Exam: Blood pressure 109/56, pulse 90, temperature 98.3 F (36.8 C), temperature source Axillary, resp. rate 27, height 5\' 2"  (1.575 m), weight 69.3 kg (152 lb 12.5 oz), SpO2 100.00%. Weight change:     rales in the bases.  Cardiac exam reveals regular rhythm. One of 6 systolic murmur.  Abdomen is soft.  Extremities no edema.   ASSESSMENT:  1. Non-ST elevation myocardial infarction  2. Acute on chronic diastolic heart failure exacerbated by ischemia  3. COPD  4. Chronic kidney disease      Plan:  1. She had Aggressive diuresis.  Styop lasix for now given Cr went from 1.1 to 1.5.  2. Anticoagulation- continue with heparin  3. Plan for cath in AM if Cr stable.  Regardless of findings, she would not consider CABG.  i think this is reasonable.  Discussed at length with patient and daughter.  Risks  and benefits explained to patient and she is agreeable.   Signed, Lance Muss S. 07/09/2013, 9:16 AM

## 2013-07-10 ENCOUNTER — Encounter (HOSPITAL_COMMUNITY)
Admission: EM | Disposition: A | Discharge status: Home Health | Payer: Medicare HMO | Source: Home / Self Care | Attending: Interventional Cardiology

## 2013-07-10 ENCOUNTER — Encounter (HOSPITAL_COMMUNITY)
Admission: EM | Disposition: A | Discharge status: Home Health | Payer: Self-pay | Source: Home / Self Care | Attending: Interventional Cardiology

## 2013-07-10 ENCOUNTER — Encounter (HOSPITAL_COMMUNITY): Payer: Self-pay | Admitting: General Practice

## 2013-07-10 HISTORY — PX: LEFT HEART CATHETERIZATION WITH CORONARY ANGIOGRAM: SHX5451

## 2013-07-10 HISTORY — PX: CORONARY ANGIOPLASTY WITH STENT PLACEMENT: SHX49

## 2013-07-10 HISTORY — PX: PERCUTANEOUS CORONARY STENT INTERVENTION (PCI-S): SHX5485

## 2013-07-10 LAB — BASIC METABOLIC PANEL
BUN: 28 mg/dL — ABNORMAL HIGH (ref 6–23)
Creatinine, Ser: 1.23 mg/dL — ABNORMAL HIGH (ref 0.50–1.10)
GFR calc Af Amer: 45 mL/min — ABNORMAL LOW (ref >90)
GFR calc non Af Amer: 39 mL/min — ABNORMAL LOW (ref >90)

## 2013-07-10 LAB — CBC
HCT: 34 % — ABNORMAL LOW (ref 36.0–46.0)
MCHC: 32.4 g/dL (ref 30.0–36.0)
MCV: 87.9 fL (ref 78.0–100.0)
RDW: 13.8 % (ref 11.5–15.5)

## 2013-07-10 LAB — POCT ACTIVATED CLOTTING TIME
Activated Clotting Time: 247 seconds
Activated Clotting Time: 170 seconds
Activated Clotting Time: 355 seconds

## 2013-07-10 LAB — HEPARIN LEVEL (UNFRACTIONATED): Heparin Unfractionated: 0.48 IU/mL (ref 0.30–0.70)

## 2013-07-10 SURGERY — PERCUTANEOUS CORONARY STENT INTERVENTION (PCI-S)
Anesthesia: LOCAL

## 2013-07-10 SURGERY — LEFT HEART CATHETERIZATION WITH CORONARY ANGIOGRAM
Anesthesia: LOCAL

## 2013-07-10 SURGERY — LEFT HEART CATHETERIZATION WITH CORONARY ANGIOGRAM
Anesthesia: Moderate Sedation

## 2013-07-10 MED ORDER — NITROGLYCERIN 0.2 MG/ML ON CALL CATH LAB
INTRAVENOUS | Status: AC
  Filled 2013-07-10: qty 1

## 2013-07-10 MED ORDER — BIVALIRUDIN 250 MG IV SOLR
INTRAVENOUS | Status: AC
  Filled 2013-07-10: qty 250

## 2013-07-10 MED ORDER — LIDOCAINE HCL (PF) 1 % IJ SOLN
INTRAMUSCULAR | Status: AC
  Filled 2013-07-10: qty 30

## 2013-07-10 MED ORDER — CLOPIDOGREL BISULFATE 75 MG PO TABS
75.0000 mg | ORAL_TABLET | Freq: Every day | ORAL | Status: DC
  Administered 2013-07-11: 10:00:00 75 mg via ORAL
  Filled 2013-07-10: qty 1

## 2013-07-10 MED ORDER — ACETAMINOPHEN 325 MG PO TABS: 650.0000 mg | ORAL_TABLET | ORAL | Status: DC | PRN

## 2013-07-10 MED ORDER — SODIUM CHLORIDE 0.9 % IV SOLN: INTRAVENOUS | Status: DC

## 2013-07-10 MED ORDER — VERAPAMIL HCL 2.5 MG/ML IV SOLN
INTRAVENOUS | Status: AC
  Filled 2013-07-10: qty 2

## 2013-07-10 MED ORDER — ONDANSETRON HCL 4 MG/2ML IJ SOLN: 4.0000 mg | Freq: Four times a day (QID) | INTRAMUSCULAR | Status: DC | PRN

## 2013-07-10 MED ORDER — HEPARIN (PORCINE) IN NACL 2-0.9 UNIT/ML-% IJ SOLN
INTRAMUSCULAR | Status: AC
  Filled 2013-07-10: qty 1000

## 2013-07-10 MED ORDER — FENTANYL CITRATE 0.05 MG/ML IJ SOLN
INTRAMUSCULAR | Status: AC
  Filled 2013-07-10: qty 2

## 2013-07-10 MED ORDER — SODIUM CHLORIDE 0.9 % IV SOLN: 1.0000 mL/kg/h | INTRAVENOUS | Status: AC

## 2013-07-10 MED ORDER — TRAZODONE 25 MG HALF TABLET
25.0000 mg | ORAL_TABLET | Freq: Every evening | ORAL | Status: DC | PRN
  Filled 2013-07-10: qty 1

## 2013-07-10 MED ORDER — ACETAMINOPHEN 325 MG PO TABS
650.0000 mg | ORAL_TABLET | ORAL | Status: DC | PRN
  Administered 2013-07-10 – 2013-07-11 (×2): 650 mg via ORAL
  Filled 2013-07-10 (×2): qty 2

## 2013-07-10 MED ORDER — ASPIRIN 81 MG PO CHEW: 81.0000 mg | CHEWABLE_TABLET | Freq: Every day | ORAL | Status: DC

## 2013-07-10 MED ORDER — ASPIRIN 81 MG PO CHEW
81.0000 mg | CHEWABLE_TABLET | Freq: Every day | ORAL | Status: DC
  Administered 2013-07-11: 10:00:00 81 mg via ORAL
  Filled 2013-07-10: qty 1

## 2013-07-10 NOTE — Progress Notes (Signed)
Had a long discussion with the family post diagnostic catheterization.  This showed an angiogram syndrome and diagram as well.  I think it is likely that she is having ischemia in her distal anterior and apical territory do to compromised collateral flow from the circumflex and RCA territories.  Given that she has three-vessel disease, we did discuss bypass surgery.  The daughter and the patient are adamant that they would not consider bypass surgery due to her overall frail condition.  I explained that angioplasty would also have increased risk, particularly in the RCA distribution.  After much consideration, we decided on attempting angioplasty in the circumflex distribution in the OM.  This appears to be a straighter vessel and the circumflex appears to give more collaterals to the distal LAD system compared to the RCA.  Everyone is aware of the risks of the procedure including MI and death.  All questions were answered.  We'll proceed later this afternoon.

## 2013-07-10 NOTE — CV Procedure (Addendum)
PROCEDURE:  Left heart catheterization with selective coronary angiography, left ventriculogram.  Right subclavian angiogram.  INDICATIONS:  NSTEMI  The risks, benefits, and details of the procedure were explained to the patient.  The patient verbalized understanding and wanted to proceed.  Informed written consent was obtained.  PROCEDURE TECHNIQUE:  After Xylocaine anesthesia a 42F sheath was placed in the right radial artery.  The JR 4 would not engage the right coronary artery.  Therefore we switched to the JL 3.5 catheter and engaged the left main.  We tried to get a no torque right into the ascending aorta but due to tortuosity in the right subclavian, this was not possible.   Through the no torque right catheter, We took an angiogram showing a significant loop in the right subclavian.  The wire was switched out for an Amplatz superstiff wire.  We tried to advance this catheter over this wire and the patient had arm pain.  We therefore stopped are tabs from the right radial artery.  The right groin was inflated with lidocaine.  The right femoral artery was accessed with a single anterior needle wall stick.     Right coronary angiography was done using a no torque catheter.  Left ventriculography was done using a pigtail catheter.  The sheath was left in and additional heparin was given in the event that PCI would be needed later in the day.    CONTRAST:  Total of 90 cc.  COMPLICATIONS:  None.    HEMODYNAMICS:  Aortic pressure was 120/53; LV pressure was 120/13; LVEDP 18.  There was no gradient between the left ventricle and aorta.    ANGIOGRAPHIC DATA:   The left main coronary artery is patent.  The left anterior descending artery is occluded in the mid segment.  There is a 90% proximal LAD lesion.  The mid to distal LAD filled by left to left collaterals.  There is a large, branching first diagonal which has moderate ostial disease.  The lower pole of this large diagonal is likely occluded as  well.  The left circumflex artery is large vessel.  In the proximal OM1, there is a diffuse, 90% stenosis.  The OM1 is a large branching vessel and supplies the left to left collaterals to the LAD.  The right coronary artery is  a large dominant vessel.  In the proximal to mid RCA, there is a diffuse stenosis up to 90%.  There is moderate calcification.  It is significant for tortuosity as well.  The PDA is a large vessel.  It appears widely patent.  The posterior lateral artery is a medium-sized vessel which is patent.  LEFT VENTRICULOGRAM:  Left ventricular angiogram was done in the 30 RAO projection and revealed  apical hypokinesis as well as distal anterior hypokinesis.  The  estimated ejection fraction is   40%.  LVEDP was 18 mmHg.  IMPRESSIONS:  1. Normal left main coronary artery. 2. Occluded left anterior descending artery and its branches. 3. 95% lesion in the OM1 branch of the left circumflex artery and its branches. 4. Diffuse 90% mid right coronary artery lesion. 5. Mildly decreased left ventricular systolic function.  LVEDP 18 mmHg.  Ejection fraction 40%. 6.  Subclavian angiogram revealed severe tortuosity in the right subclavian which prohibits catheter advancement from the right radial.  RECOMMENDATION:  Will discuss options with the family including CVTS referral.  We discussed this 3 procedure and they felt she was not a bypass surgery candidate.  This is reasonable  given that she is somewhat frail.  However, knowing now there is three-vessel disease in that the right coronary artery would be difficult to treat percutaneously, I would like to confirm with the family that there still not in favor of getting a CVTS opinion.

## 2013-07-10 NOTE — CV Procedure (Signed)
PROCEDURE:  PCI OM1  INDICATIONS:  NSTEMI  The risks, benefits, and details of the procedure were explained to the patient.  The patient verbalized understanding and wanted to proceed.  Informed written consent was obtained.  PROCEDURE TECHNIQUE:  After Xylocaine anesthesia a 64F sheath was exchanged in the right femoral artery.   Left coronary angiography was done using a CLS 3.5 guide catheter.  Angiomax was used for anticoagulation.  An ACT was used to check that the angiomax was therapeutic.  A 2.5 x 12 balloon was used to predilate.  A 2.5 x 16 Promus DES was deployed and postdilated with a 3.0 x 12 [place] balloon.  There was an excellent angiographic result.  The AV continuation branch was patent.  IC NTG given several times during the case. She tolerated the procedure well.   CONTRAST:  Total of 70 cc.  COMPLICATIONS:  None.     ANGIOGRAPHIC DATA:    The OM1 has a 90% proximal stenosis.    IMPRESSIONS:  1. Successful PCI with a 2.5 x 16 Promus DES to the OM1, postdilated to 3.08 mm proximally.   RECOMMENDATION:  Continue aspirin and plavix for at least a year.  Aggressive secondary prevention.  Would consider PCI of RCA only if she has sx refractory to medical therapy.

## 2013-07-10 NOTE — Progress Notes (Addendum)
Site area: right groin  Site Prior to Removal:  Level 0  Pressure Applied For 20 MINUTES    Minutes Beginning at 1742  Manual:   yes  Patient Status During Pull:  stable  Post Pull Groin Site:  Level 0  Post Pull Instructions Given:  yes  Post Pull Pulses Present:  yes  Dressing Applied:  yes  Comments:  Pulled byTonya Harrleson RN  And I supervised the pull.   TR BAND REMOVAL  LOCATION:  right radial  DEFLATED PER PROTOCOL:  yes  TIME BAND OFF / DRESSING APPLIED:   1915   SITE UPON ARRIVAL:   Level 0  SITE AFTER BAND REMOVAL:  Level 0  REVERSE ALLEN'S TEST:    positive  CIRCULATION SENSATION AND MOVEMENT:  Within Normal Limits  yes  COMMENTS:

## 2013-07-10 NOTE — Progress Notes (Signed)
ANTICOAGULATION CONSULT NOTE - Follow Up Consult  Pharmacy Consult for heparin Indication: chest pain/ACS  Allergies  Allergen Reactions  . Sulfa Antibiotics Other (See Comments)    unknown    Patient Measurements: Height: 5\' 2"  (157.5 cm) Weight: 154 lb 12.2 oz (70.2 kg) IBW/kg (Calculated) : 50.1  Vital Signs: Temp: 98.1 F (36.7 C) (09/03 0739) Temp src: Oral (09/03 0739) BP: 119/62 mmHg (09/03 0739) Pulse Rate: 77 (09/03 0739)  Labs:  Recent Labs  07/08/13 0600  07/08/13 0850 07/08/13 1200 07/08/13 1700 07/09/13 0045 07/10/13 0415  HGB 11.5*  --  12.3  --   --  10.4* 11.0*  HCT 35.1*  --  36.6  --   --  31.9* 34.0*  PLT 199  --  205  --   --  181 188  LABPROT 12.9  --   --   --   --   --   --   INR 0.99  --   --   --   --   --   --   HEPARINUNFRC  --   < > 0.79*  --  0.43 0.47 0.48  CREATININE 1.39*  --   --   --   --  1.50* 1.23*  TROPONINI 7.85*  --   --  8.72* 8.82* 7.07*  --   < > = values in this interval not displayed.  Estimated Creatinine Clearance: 30.7 ml/min (by C-G formula based on Cr of 1.23).  Assessment: 79yo female presents to ED via EMS w/ respiratory distress after being discharged day prior for MI/PNA, vomited while wearing CPAP, initial troponin elevated. Chest x-ray was showing asymmetric edema concerning for CHF versus pneumonia  Anticoagulation: h/o DVT, elevated troponins. Heparin level therapeutic on 950 units/hr.  No bleeding or complications noted, CBC with slight trend down.  Awaiting cath this morning.  Goal of Therapy:  Heparin level 0.3-0.7 units/ml Monitor platelets by anticoagulation protocol: Yes   Plan:  Continue IV heparin at current rate. Continue daily heparin level and CBC. F/u plans post-cath  Sheppard Coil PharmD., BCPS Clinical Pharmacist Pager [phone] 07/10/2013 9:28 AM

## 2013-07-10 NOTE — Progress Notes (Signed)
Patient Name: [name] Date of Encounter: 07/10/2013    SUBJECTIVE:  She developed recurrent severe chest pain that led to admission.  It was more severe than on the prior admission. The was consensus that her age conservative medical therapy would be attempted rather than invasive approach last week  She returned now with a significant increase in troponin, after coming in with CHF last week.  Currently pain free. Breathing better.  TELEMETRY:  NSR: Filed Vitals:   07/09/13 2131 07/10/13 0025 07/10/13 0457 07/10/13 0739  BP:  115/53 119/53 119/62  Pulse:  73 85 77  Temp:  97.9 F (36.6 C) 98.7 F (37.1 C) 98.1 F (36.7 C)  TempSrc:  Oral Oral Oral  Resp:  22 12 21   Height:      Weight: 70.2 kg (154 lb 12.2 oz)  70.2 kg (154 lb 12.2 oz)   SpO2:  100% 98% 100%    Intake/Output Summary (Last 24 hours) at 07/10/13 0816 Last data filed at 07/10/13 0800  Gross per 24 hour  Intake    953 ml  Output   1475 ml  Net   -522 ml    LABS: Basic Metabolic Panel:  Recent Labs  16/10/96 0045 07/10/13 0415  NA 134* 141  K 4.5 4.5  CL 99 104  CO2 27 27  GLUCOSE 113* 110*  BUN 29* 28*  CREATININE 1.50* 1.23*  CALCIUM 10.1 10.6*   CBC:  Recent Labs  07/08/13 0600  07/09/13 0045 07/10/13 0415  WBC 8.3  < > 6.5 6.2  NEUTROABS 6.3  --   --   --   HGB 11.5*  < > 10.4* 11.0*  HCT 35.1*  < > 31.9* 34.0*  MCV 88.2  < > 88.6 87.9  PLT 199  < > 181 188  < > = values in this interval not displayed. BNP (last 3 results)  Recent Labs  07/04/13 0019 07/04/13 0810 07/07/13 2038  PROBNP 748.2* 1436.0* 1731.0*   Cardiac Enzymes: Results for LAJUANDA, PENICK (MRN [id number]) as of 07/08/2013 10:14  Ref. Range 07/04/2013 23:20 07/07/2013 20:38 07/07/2013 20:45 07/08/2013 00:53 07/08/2013 06:00  Troponin I Latest Range: <0.30 ng/mL 1.39 (HH)   3.53 (HH) 7.85 (HH)   Radiology/Studies:  Study Result    *RADIOLOGY REPORT*  Clinical Data: Short of breath  PORTABLE CHEST - 1  VIEW  Comparison: 07/04/2013  Findings: Mild cardiac enlargement. Asymmetric airspace disease on  the right with fluid in the right minor fissure. This may  represent fluid overload and edema. Pneumonia also possible.  Small left effusion is suspected. There is underlying chronic lung  disease.  IMPRESSION:  Asymmetric airspace disease on the right. This may represent edema  or pneumonia.  Original Report Authenticated By: Janeece Riggers, M.D.   ECG: Not repeated since the prior admission. Poor R wave progression was noted.  Physical Exam: Blood pressure 119/62, pulse 77, temperature 98.1 F (36.7 C), temperature source Oral, resp. rate 21, height 5\' 2"  (1.575 m), weight 70.2 kg (154 lb 12.2 oz), SpO2 100.00%. Weight change:     rales in the bases.  Cardiac exam reveals regular rhythm. One of 6 systolic murmur.  Abdomen is soft.  Extremities no edema.   ASSESSMENT:  1. Non-ST elevation myocardial infarction  2. Acute on chronic diastolic heart failure exacerbated by ischemia  3. COPD  4. Chronic kidney disease      Plan:  1. She had Aggressive diuresis.  Stopped lasix for now  given Cr went from 1.1 to 1.5.   Down to 1.2 today.  Breathing stable.  2. Anticoagulation- continue with heparin  3. Plan for cathsince Cr stable.  Regardless of findings, she would not consider CABG.  i think this is reasonable.  Discussed at length with patient and daughter.  Risks and benefits explained to patient and she is agreeable.   Signed, Yanelie Abraha S. 07/10/2013, 8:16 AM

## 2013-07-11 LAB — BASIC METABOLIC PANEL
CO2: 24 mEq/L (ref 19–32)
Chloride: 101 mEq/L (ref 96–112)
Glucose, Bld: 98 mg/dL (ref 70–99)
Potassium: 4.7 mEq/L (ref 3.5–5.1)
Sodium: 134 mEq/L — ABNORMAL LOW (ref 135–145)

## 2013-07-11 LAB — CBC
Hemoglobin: 10.5 g/dL — ABNORMAL LOW (ref 12.0–15.0)
RBC: 3.69 MIL/uL — ABNORMAL LOW (ref 3.87–5.11)

## 2013-07-11 MED ORDER — FUROSEMIDE 10 MG/ML IJ SOLN
40.0000 mg | Freq: Once | INTRAMUSCULAR | Status: AC
  Administered 2013-07-11: 40 mg via INTRAVENOUS
  Filled 2013-07-11: qty 4

## 2013-07-11 MED FILL — Sodium Chloride IV Soln 0.9%: INTRAVENOUS | Qty: 50 | Status: AC

## 2013-07-11 NOTE — Care Management Note (Signed)
    Page 1 of 2   07/11/2013     11:17:54 AM   Johns MANAGEMENT NOTE 07/11/2013  Patient:  [name], [name]   Account Number:  [id number]  Date Initiated:  07/09/2013  Documentation initiated by:  [facility]  Subjective/Objective Assessment:   Admitted with recurrent CP and SOB - NSTEMI.//home with spouse     Action/Plan:   cardiac cath//home with home health   Anticipated DC Date:  07/12/2013   Anticipated DC Plan:  HOME W HOME HEALTH SERVICES      DC Planning Services  CM consult      [facility] Choice  HOME HEALTH   Choice offered to / List presented to:  C-3 Spouse        HH arranged  HH-2 PT      [facility] agency  [facility]   Status of service:  In process, will continue to follow Medicare Important Message given?   (If response is "NO", the following Medicare IM given date fields will be blank) Date Medicare IM given:   Date Additional Medicare IM given:    Discharge Disposition:    Per UR Regulation:  Reviewed for med. necessity/level of Johns/duration of stay  If discussed at Long Length of Stay Meetings, dates discussed:    Comments:  07/11/13 1040 Kristi Cohn, RN, BSN, [name] [phone] Spoke with pt and husband at bedside regarding discharge planning.  Offered pt and husband  list of home health agencies to choose from.  PT chose [facility] to render services.  [name] of [name] notified.  Pt currently has two rolling walkers at home and states she does fine in the shower because she uses the side rails. No additional DMEs identifed at this time.  Contact:  [name], [name] [phone]                 [name],[name] Daughter     [phone]

## 2013-07-11 NOTE — Progress Notes (Signed)
CARDIAC REHAB PHASE I   PRE:  Rate/Rhythm: 72SR  BP:  Supine:   Sitting: 138/62  Standing:    SaO2: 95%RA  MODE:  Ambulation: 160 ft   POST:  Rate/Rhythm: 112  BP:  Supine:   Sitting: 161/89  Standing:    SaO2: 96%RA during walk [phone] Pt very deconditioned. Walked 160 ft on RA with gait belt use, rolling walker and asst x 2. Tired easily. C/o legs and feet hurting but denied CP. Took several standing rest breaks. Not safe due to leaning on walker and getting it out too far. Would recommend PT consult. Do not feel pt ready for discharge at this time.   Luetta Nutting, RN BSN  07/11/2013 8:21 AM

## 2013-07-11 NOTE — Evaluation (Signed)
Physical Therapy Evaluation Patient Details Name: [name] MRN: [id number] DOB: [birth year] Today's Date: 07/11/2013 Time: 4098-1191 PT Time Calculation (min): 13 min  PT Assessment / Plan / Recommendation History of Present Illness  79 y.o. female admitted to [facility] on 07/07/13 who was discharged home yesterday after being treated for non-ST elevation MI and treated medically presented to the ER because of shortness of breath. Patient states that this evening around 5 PM patient became acutely short of breath and started having epigastric chest and left arm discomfort. Patient also had episode of nausea and vomiting. Patient was brought to the ER and was found to be acutely short of breath and has been placed on 100% nonrebreather. Chest x-ray was showing asymmetric edema concerning for CHF versus pneumonia. EKG were showing nonspecific findings and troponins are elevated.  NSETMI dx and pt s/p PCI stenting on 07/10/13.    Clinical Impression  I am familiar with this pt from her last admission.  She continues to have leg weakness, pain, and is at high risk for falls.  She has supportive husband and plenty of assist at discharge.  Recommend HHPT and 24 hour assist.  I reinforced to her husband that she needs someone with her when she is up moving around.  PT to follow acutely for deficits listed below.  Schedule set up with cardiac rehab phase 1.      PT Assessment  Patient needs continued PT services    Follow Up Recommendations  Home health PT;Supervision for mobility/OOB    Does the patient have the potential to tolerate intense rehabilitation     NA  Barriers to Discharge Other (comment) (None) None    Equipment Recommendations  None recommended by PT    Recommendations for Other Services Other (comment) (None)   Frequency Min 3X/week    Precautions / Restrictions Precautions Precautions: Fall   Pertinent Vitals/Pain O2 sats mid to upper 90s on RA with gait.  No DOE with gait.         Mobility  Bed Mobility Bed Mobility: Sit to Supine Sit to Supine: 6: Modified independent (Device/Increase time);With rail;HOB flat Details for Bed Mobility Assistance: pt used railing for leverage to get back into the bed.  Transfers Transfers: Sit to Stand;Stand to Sit Sit to Stand: 5: Supervision;With upper extremity assist;From bed Stand to Sit: 5: Supervision;With upper extremity assist;To bed Details for Transfer Assistance: reliance on hands for balance and stability during transitions.  Ambulation/Gait Ambulation/Gait Assistance: 4: Min assist Ambulation Distance (Feet): 150 Feet Assistive device: Rolling walker Ambulation/Gait Assistance Details: Min assist to support trunk. Pt reporting left leg cramping while walking making her gait antalgic requiring heavier min assist second half of walk.   Gait Pattern: Step-through pattern;Shuffle;Trunk flexed;Decreased step length - left;Decreased stance time - left;Antalgic Gait velocity: less than 1.8 ft/sec indicating risk for recurrent falls General Gait Details: No DOE with gait today, O2 sats mid to upper 90s on RA with gait.         PT Diagnosis: Difficulty walking;Abnormality of gait;Generalized weakness  PT Problem List: Decreased strength;Decreased activity tolerance;Decreased balance;Decreased mobility;Decreased knowledge of use of DME;Obesity PT Treatment Interventions: DME instruction;Gait training;Functional mobility training;Therapeutic activities;Therapeutic exercise;Balance training;Neuromuscular re-education;Patient/family education     PT Goals(Current goals can be found in the care plan section) Acute Rehab PT Goals Patient Stated Goal: to get back home PT Goal Formulation: With patient/family Time For Goal Achievement: 08/02/13 Potential to Achieve Goals: Good  Visit Information  Last PT Received  On: 07/11/13 Assistance Needed: +1 History of Present Illness: 79 y.o. female admitted to [facility] on 07/07/13  who was discharged home yesterday after being treated for non-ST elevation MI and treated medically presented to the ER because of shortness of breath. Patient states that this evening around 5 PM patient became acutely short of breath and started having epigastric chest and left arm discomfort. Patient also had episode of nausea and vomiting. Patient was brought to the ER and was found to be acutely short of breath and has been placed on 100% nonrebreather. Chest x-ray was showing asymmetric edema concerning for CHF versus pneumonia. EKG were showing nonspecific findings and troponins are elevated.  NSETMI dx and pt s/p PCI stenting on 07/10/13.         Prior Functioning  Home Living Family/patient expects to be discharged to:: Private residence Living Arrangements: Spouse/significant other Available Help at Discharge: Family;Personal care attendant;Available 24 hours/day (spouse works, Comptroller is there when he is not home) Type of Home: House Home Access: Level entry Home Layout: One level Home Equipment: Environmental consultant - 4 wheels (home O2 per pt report only at night. ) Prior Function Level of Independence: Independent with assistive device(s) Comments: sitter there during the day, but doesn't cook or clean or do anything  but "sit there and stare at me" Communication Communication: HOH Dominant Hand: Right    Cognition  Cognition Arousal/Alertness: Awake/alert Behavior During Therapy: WFL for tasks assessed/performed Overall Cognitive Status: Within Functional Limits for tasks assessed Memory: Decreased short-term memory (maybe some mild memory deficits, she did not remeber me )    Extremity/Trunk Assessment Upper Extremity Assessment Upper Extremity Assessment: Overall WFL for tasks assessed Lower Extremity Assessment Lower Extremity Assessment: Generalized weakness Cervical / Trunk Assessment Cervical / Trunk Assessment: Kyphotic      End of Session PT - End of Session Activity Tolerance:  Patient limited by fatigue;Patient limited by pain Patient left: in bed;with call bell/phone within reach;with family/visitor present Nurse Communication: Other (comment) (need for HHPT)    Travontae Freiberger B. Sheryll Dymek, PT, DPT [phone]   07/11/2013, 11:10 AM

## 2013-07-11 NOTE — Discharge Summary (Signed)
Patient ID: [name] MRN: [id number] DOB/AGE: January 22, 1926 79 y.o.  Admit date: 07/07/2013 Discharge date: 07/11/2013  Primary Discharge Diagnosis non-STEMI Secondary Discharge Diagnosis CAD, hypertension, pulmonary edema, systolic heart failure  Significant Diagnostic Studies: angiography: Cardiac catheterization revealing severe three-vessel disease.  The patient and family did not want to pursue bypass surgery.  2.5 x 16 Promus drug-eluting stent placed into the large OM1 which provides collaterals to the LAD territory.  Consults: None  Hospital Course: 29 or old woman with known coronary artery disease since 2012.  She was found at that time to have an LAD occlusion.  The distal LAD territory was filled by collaterals.  A week ago, she had chest discomfort and a mildly increased troponin.  Due to her age and her overall frailty, she was managed medically.  She had some fluid overload as well and was diureses.  She felt well at discharge but a day later came back to the hospital with shortness of breath and even more severe chest pain.  Her troponin increased even higher.  Therefore, after diuresis and discussion with her family, she is brought to the cath lab.  She had three-vessel disease and her procedure was stopped.  Another discussion occurred with the family and they ultimately elected for PCI of the more straightforward OM1 vessel.  The right coronary artery has a long diffuse stenosis and would be more technically challenging to fix percutaneously.  The LAD is occluded for several years.  The patient tolerated the angioplasty well.  She walked with cardiac rehabilitation the next day.  She had no angina but was found to be weak in the legs.  Cardiac rehabilitation recommend physical therapy.  We got a physical therapy evaluation on morning of discharge.  She received some IV Lasix as well due to the fact that this was held before the cath.  She was set up with home health PT and deemed  ready for discharge.   Discharge Exam: Blood pressure 161/89, pulse 74, temperature 98.1 F (36.7 C), temperature source Oral, resp. rate 18, height 5\' 2"  (1.575 m), weight 69 kg (152 lb 1.9 oz), SpO2 98.00%.   [place]/18 No JVD RRR, S1, S2, 2/6 systolic murmur Bibasilar crackles Soft nontender No edema, no right groin hematoma, no right wrist hematoma Labs:   Lab Results  Component Value Date   WBC 6.6 07/11/2013   HGB 10.5* 07/11/2013   HCT 32.4* 07/11/2013   MCV 87.8 07/11/2013   PLT 174 07/11/2013    Recent Labs Lab 07/08/13 0600  07/11/13 0555  NA 136  < > 134*  K 4.4  < > 4.7  CL 98  < > 101  CO2 27  < > 24  BUN 25*  < > 25*  CREATININE 1.39*  < > 1.05  CALCIUM 10.7*  < > 10.6*  PROT 6.3  --   --   BILITOT 0.5  --   --   ALKPHOS 79  --   --   ALT 16  --   --   AST 49*  --   --   GLUCOSE 120*  < > 98  < > = values in this interval not displayed. Lab Results  Component Value Date   CKTOTAL 93 02/08/2013   CKMB 5.5* 02/08/2013   TROPONINI 7.07* 07/09/2013    Lab Results  Component Value Date   CHOL 177 07/05/2013   CHOL 148 07/02/2011   Lab Results  Component Value Date   HDL  33* 07/05/2013   HDL 32* 07/02/2011   Lab Results  Component Value Date   LDLCALC 69 07/05/2013   LDLCALC 81 07/02/2011   Lab Results  Component Value Date   TRIG 373* 07/05/2013   TRIG 175* 07/02/2011   Lab Results  Component Value Date   CHOLHDL 5.4 07/05/2013   CHOLHDL 4.6 07/02/2011   No results found for this basename: LDLDIRECT      Radiology: Airspace disease in the right lung concerning for pulmonary edema versus pneumonia on 8/31 EKG: Normal sinus rhythm, inferolateral ST-T wave changes.  FOLLOW UP PLANS AND APPOINTMENTS    Medication List         acetaminophen 325 MG tablet  Commonly known as:  TYLENOL  Take 650 mg by mouth every 6 (six) hours as needed for pain.     albuterol (5 MG/ML) 0.5% nebulizer solution  Commonly known as:  PROVENTIL  Take 0.5 mLs (2.5 mg total) by  nebulization 3 (three) times daily.     albuterol 108 (90 BASE) MCG/ACT inhaler  Commonly known as:  PROVENTIL HFA;VENTOLIN HFA  Inhale 2 puffs into the lungs every 6 (six) hours as needed for wheezing.     aspirin EC 81 MG tablet  Take 81 mg by mouth daily.     clopidogrel 75 MG tablet  Commonly known as:  PLAVIX  Take 75 mg by mouth daily.     ferrous sulfate 325 (65 FE) MG tablet  Take 325 mg by mouth daily with breakfast.     Fluticasone-Salmeterol 100-50 MCG/DOSE Aepb  Commonly known as:  ADVAIR  Inhale 1 puff into the lungs every 12 (twelve) hours.     furosemide 40 MG tablet  Commonly known as:  LASIX  Take 1 tablet (40 mg total) by mouth daily.     isosorbide mononitrate 30 MG 24 hr tablet  Commonly known as:  IMDUR  Take 2 tablets (60 mg total) by mouth daily.     metoprolol tartrate 25 MG tablet  Commonly known as:  LOPRESSOR  Take 25 mg by mouth 2 (two) times daily.     nitroGLYCERIN 0.4 MG SL tablet  Commonly known as:  NITROSTAT  Place 0.4 mg under the tongue every 5 (five) minutes as needed.     pantoprazole 40 MG tablet  Commonly known as:  PROTONIX  Take 40 mg by mouth daily.     potassium chloride 20 MEQ packet  Commonly known as:  KLOR-CON  Take 20 mEq by mouth 2 (two) times daily.     pravastatin 20 MG tablet  Commonly known as:  PRAVACHOL  Take 20 mg by mouth daily.     ranitidine 150 MG tablet  Commonly known as:  ZANTAC  Take 150 mg by mouth 2 (two) times daily.     tiotropium 18 MCG inhalation capsule  Commonly known as:  SPIRIVA  Place 18 mcg into inhaler and inhale daily.     Vitamin D 1000 UNITS capsule  Take 1,000 Units by mouth daily.           Follow-up Information   Follow up with Corky Crafts., MD In 2 weeks.   Specialty:  Cardiology   Contact information:   [address] [place] [address] [phone]       BRING ALL MEDICATIONS WITH YOU TO FOLLOW UP APPOINTMENTS  Time spent with  patient to include physician time: 35 minutes, going over therapy options with the patient and  family along with coordinating home health  Signed: Alec Mcphee S. 07/11/2013, 11:45 AM

## 2013-08-07 ENCOUNTER — Encounter (HOSPITAL_COMMUNITY): Payer: Self-pay

## 2013-08-07 ENCOUNTER — Emergency Department (HOSPITAL_COMMUNITY): Payer: Medicare HMO

## 2013-08-07 ENCOUNTER — Inpatient Hospital Stay (HOSPITAL_COMMUNITY)
Admission: EM | Admit: 2013-08-07 | Discharge: 2013-08-10 | DRG: 291 | Disposition: A | Discharge status: Home / Self Care | Payer: Medicare HMO | Attending: Internal Medicine | Admitting: Internal Medicine

## 2013-08-07 DIAGNOSIS — I214 Non-ST elevation (NSTEMI) myocardial infarction: Secondary | ICD-10-CM | POA: Diagnosis present

## 2013-08-07 DIAGNOSIS — H9190 Unspecified hearing loss, unspecified ear: Secondary | ICD-10-CM | POA: Diagnosis present

## 2013-08-07 DIAGNOSIS — K219 Gastro-esophageal reflux disease without esophagitis: Secondary | ICD-10-CM | POA: Diagnosis present

## 2013-08-07 DIAGNOSIS — I739 Peripheral vascular disease, unspecified: Secondary | ICD-10-CM

## 2013-08-07 DIAGNOSIS — J45909 Unspecified asthma, uncomplicated: Secondary | ICD-10-CM

## 2013-08-07 DIAGNOSIS — I2510 Atherosclerotic heart disease of native coronary artery without angina pectoris: Secondary | ICD-10-CM | POA: Diagnosis present

## 2013-08-07 DIAGNOSIS — D510 Vitamin B12 deficiency anemia due to intrinsic factor deficiency: Secondary | ICD-10-CM | POA: Diagnosis present

## 2013-08-07 DIAGNOSIS — E7800 Pure hypercholesterolemia, unspecified: Secondary | ICD-10-CM | POA: Diagnosis present

## 2013-08-07 DIAGNOSIS — Z7902 Long term (current) use of antithrombotics/antiplatelets: Secondary | ICD-10-CM

## 2013-08-07 DIAGNOSIS — I10 Essential (primary) hypertension: Secondary | ICD-10-CM | POA: Diagnosis present

## 2013-08-07 DIAGNOSIS — I5021 Acute systolic (congestive) heart failure: Principal | ICD-10-CM | POA: Diagnosis present

## 2013-08-07 DIAGNOSIS — J9600 Acute respiratory failure, unspecified whether with hypoxia or hypercapnia: Secondary | ICD-10-CM | POA: Diagnosis present

## 2013-08-07 DIAGNOSIS — I509 Heart failure, unspecified: Secondary | ICD-10-CM | POA: Diagnosis present

## 2013-08-07 DIAGNOSIS — Z9861 Coronary angioplasty status: Secondary | ICD-10-CM

## 2013-08-07 DIAGNOSIS — Z79899 Other long term (current) drug therapy: Secondary | ICD-10-CM

## 2013-08-07 DIAGNOSIS — F411 Generalized anxiety disorder: Secondary | ICD-10-CM | POA: Diagnosis present

## 2013-08-07 DIAGNOSIS — J4489 Other specified chronic obstructive pulmonary disease: Secondary | ICD-10-CM | POA: Diagnosis present

## 2013-08-07 DIAGNOSIS — E871 Hypo-osmolality and hyponatremia: Secondary | ICD-10-CM

## 2013-08-07 DIAGNOSIS — J449 Chronic obstructive pulmonary disease, unspecified: Secondary | ICD-10-CM | POA: Diagnosis present

## 2013-08-07 LAB — TROPONIN I
Troponin I: <0.3 ng/mL (ref <0.30)
Troponin I: <0.3 ng/mL (ref <0.30)
Troponin I: <0.3 ng/mL (ref <0.30)
Troponin I: <0.3 ng/mL (ref <0.30)

## 2013-08-07 LAB — CBC WITH DIFFERENTIAL/PLATELET
Basophils Relative: 0 % (ref 0–1)
Eosinophils Absolute: 0.1 10*3/uL (ref 0.0–0.7)
Eosinophils Relative: 1 % (ref 0–5)
HCT: 35.2 % — ABNORMAL LOW (ref 36.0–46.0)
Hemoglobin: 11.6 g/dL — ABNORMAL LOW (ref 12.0–15.0)
Lymphs Abs: 0.7 10*3/uL (ref 0.7–4.0)
MCH: 28.4 pg (ref 26.0–34.0)
MCV: 86.3 fL (ref 78.0–100.0)
Monocytes Relative: 8 % (ref 3–12)
RBC: 4.08 MIL/uL (ref 3.87–5.11)
RDW: 14 % (ref 11.5–15.5)

## 2013-08-07 LAB — COMPREHENSIVE METABOLIC PANEL
Alkaline Phosphatase: 88 U/L (ref 39–117)
BUN: 26 mg/dL — ABNORMAL HIGH (ref 6–23)
Calcium: 9.5 mg/dL (ref 8.4–10.5)
Chloride: 100 mEq/L (ref 96–112)
GFR calc Af Amer: 45 mL/min — ABNORMAL LOW (ref >90)
GFR calc non Af Amer: 39 mL/min — ABNORMAL LOW (ref >90)
Glucose, Bld: 153 mg/dL — ABNORMAL HIGH (ref 70–99)
Sodium: 144 mEq/L (ref 135–145)
Total Protein: 6.5 g/dL (ref 6.0–8.3)

## 2013-08-07 MED ORDER — FERROUS SULFATE 325 (65 FE) MG PO TABS
325.0000 mg | ORAL_TABLET | Freq: Every day | ORAL | Status: DC
  Administered 2013-08-07 – 2013-08-10 (×4): 325 mg via ORAL
  Filled 2013-08-07 (×5): qty 1

## 2013-08-07 MED ORDER — FUROSEMIDE 10 MG/ML IJ SOLN
40.0000 mg | Freq: Every day | INTRAMUSCULAR | Status: DC
  Administered 2013-08-07: 40 mg via INTRAVENOUS
  Filled 2013-08-07: qty 4

## 2013-08-07 MED ORDER — HEPARIN SODIUM (PORCINE) 5000 UNIT/ML IJ SOLN
5000.0000 [IU] | Freq: Three times a day (TID) | INTRAMUSCULAR | Status: DC
  Administered 2013-08-07 – 2013-08-10 (×10): 5000 [IU] via SUBCUTANEOUS
  Filled 2013-08-07 (×13): qty 1

## 2013-08-07 MED ORDER — ONDANSETRON HCL 4 MG/2ML IJ SOLN
4.0000 mg | Freq: Four times a day (QID) | INTRAMUSCULAR | Status: DC | PRN
  Administered 2013-08-07: 20:00:00 4 mg via INTRAVENOUS
  Filled 2013-08-07 (×2): qty 2

## 2013-08-07 MED ORDER — FAMOTIDINE 20 MG PO TABS
20.0000 mg | ORAL_TABLET | Freq: Every day | ORAL | Status: DC
  Administered 2013-08-07 – 2013-08-10 (×4): 20 mg via ORAL
  Filled 2013-08-07 (×4): qty 1

## 2013-08-07 MED ORDER — INFLUENZA VAC SPLIT QUAD 0.5 ML IM SUSP
0.5000 mL | INTRAMUSCULAR | Status: AC
  Administered 2013-08-08: 0.5 mL via INTRAMUSCULAR
  Filled 2013-08-07: qty 0.5

## 2013-08-07 MED ORDER — TIOTROPIUM BROMIDE MONOHYDRATE 18 MCG IN CAPS
18.0000 ug | ORAL_CAPSULE | Freq: Every day | RESPIRATORY_TRACT | Status: DC
  Administered 2013-08-08 – 2013-08-10 (×3): 18 ug via RESPIRATORY_TRACT
  Filled 2013-08-07 (×2): qty 5

## 2013-08-07 MED ORDER — PANTOPRAZOLE SODIUM 40 MG PO TBEC
40.0000 mg | DELAYED_RELEASE_TABLET | Freq: Every day | ORAL | Status: DC
  Administered 2013-08-07 – 2013-08-10 (×4): 40 mg via ORAL
  Filled 2013-08-07 (×4): qty 1

## 2013-08-07 MED ORDER — ALBUTEROL SULFATE (5 MG/ML) 0.5% IN NEBU
INHALATION_SOLUTION | RESPIRATORY_TRACT | Status: AC
  Filled 2013-08-07: qty 0.5

## 2013-08-07 MED ORDER — SIMVASTATIN 10 MG PO TABS
10.0000 mg | ORAL_TABLET | Freq: Every day | ORAL | Status: DC
  Administered 2013-08-07 – 2013-08-09 (×3): 10 mg via ORAL
  Filled 2013-08-07 (×4): qty 1

## 2013-08-07 MED ORDER — ALBUTEROL SULFATE (5 MG/ML) 0.5% IN NEBU
2.5000 mg | INHALATION_SOLUTION | Freq: Three times a day (TID) | RESPIRATORY_TRACT | Status: DC
  Administered 2013-08-08 – 2013-08-10 (×7): 2.5 mg via RESPIRATORY_TRACT
  Filled 2013-08-07 (×10): qty 0.5

## 2013-08-07 MED ORDER — FUROSEMIDE 10 MG/ML IJ SOLN
40.0000 mg | Freq: Two times a day (BID) | INTRAMUSCULAR | Status: DC
  Administered 2013-08-07 – 2013-08-08 (×2): 40 mg via INTRAVENOUS
  Filled 2013-08-07 (×4): qty 4

## 2013-08-07 MED ORDER — ALBUTEROL SULFATE (5 MG/ML) 0.5% IN NEBU: 2.5000 mg | INHALATION_SOLUTION | Freq: Three times a day (TID) | RESPIRATORY_TRACT | Status: DC

## 2013-08-07 MED ORDER — ALBUTEROL SULFATE (5 MG/ML) 0.5% IN NEBU
2.5000 mg | INHALATION_SOLUTION | RESPIRATORY_TRACT | Status: DC
  Administered 2013-08-07 (×5): 2.5 mg via RESPIRATORY_TRACT
  Filled 2013-08-07 (×4): qty 0.5

## 2013-08-07 MED ORDER — IPRATROPIUM BROMIDE 0.02 % IN SOLN
RESPIRATORY_TRACT | Status: AC
  Filled 2013-08-07: qty 2.5

## 2013-08-07 MED ORDER — POTASSIUM CHLORIDE CRYS ER 20 MEQ PO TBCR
20.0000 meq | EXTENDED_RELEASE_TABLET | Freq: Two times a day (BID) | ORAL | Status: DC
  Administered 2013-08-07 – 2013-08-08 (×3): 20 meq via ORAL
  Filled 2013-08-07 (×4): qty 1

## 2013-08-07 MED ORDER — FUROSEMIDE 10 MG/ML IJ SOLN
40.0000 mg | Freq: Once | INTRAMUSCULAR | Status: AC
  Administered 2013-08-07: 40 mg via INTRAVENOUS
  Filled 2013-08-07: qty 4

## 2013-08-07 MED ORDER — METOPROLOL TARTRATE 25 MG PO TABS
25.0000 mg | ORAL_TABLET | Freq: Two times a day (BID) | ORAL | Status: DC
  Administered 2013-08-07 – 2013-08-10 (×7): 25 mg via ORAL
  Filled 2013-08-07 (×8): qty 1

## 2013-08-07 MED ORDER — MOMETASONE FURO-FORMOTEROL FUM 100-5 MCG/ACT IN AERO
2.0000 | INHALATION_SPRAY | Freq: Two times a day (BID) | RESPIRATORY_TRACT | Status: DC
  Administered 2013-08-07 – 2013-08-10 (×7): 2 via RESPIRATORY_TRACT
  Filled 2013-08-07: qty 8.8

## 2013-08-07 MED ORDER — VITAMIN D3 25 MCG (1000 UNIT) PO TABS
1000.0000 [IU] | ORAL_TABLET | Freq: Every day | ORAL | Status: DC
  Administered 2013-08-07 – 2013-08-10 (×4): 1000 [IU] via ORAL
  Filled 2013-08-07 (×4): qty 1

## 2013-08-07 MED ORDER — ISOSORBIDE MONONITRATE ER 60 MG PO TB24
60.0000 mg | ORAL_TABLET | Freq: Every day | ORAL | Status: DC
  Administered 2013-08-07 – 2013-08-10 (×4): 60 mg via ORAL
  Filled 2013-08-07 (×4): qty 1

## 2013-08-07 MED ORDER — IPRATROPIUM BROMIDE 0.02 % IN SOLN
0.5000 mg | RESPIRATORY_TRACT | Status: DC
  Administered 2013-08-07 (×2): 0.5 mg via RESPIRATORY_TRACT
  Filled 2013-08-07: qty 2.5

## 2013-08-07 MED ORDER — NITROGLYCERIN 2 % TD OINT
1.0000 [in_us] | TOPICAL_OINTMENT | Freq: Four times a day (QID) | TRANSDERMAL | Status: DC
  Administered 2013-08-07 – 2013-08-10 (×12): 1 [in_us] via TOPICAL
  Filled 2013-08-07: qty 30

## 2013-08-07 MED ORDER — ACETAMINOPHEN 325 MG PO TABS
650.0000 mg | ORAL_TABLET | Freq: Four times a day (QID) | ORAL | Status: DC | PRN
  Administered 2013-08-08: 650 mg via ORAL
  Filled 2013-08-07: qty 2

## 2013-08-07 MED ORDER — ASPIRIN EC 81 MG PO TBEC
81.0000 mg | DELAYED_RELEASE_TABLET | Freq: Every day | ORAL | Status: DC
  Administered 2013-08-07 – 2013-08-10 (×4): 81 mg via ORAL
  Filled 2013-08-07 (×4): qty 1

## 2013-08-07 MED ORDER — CLOPIDOGREL BISULFATE 75 MG PO TABS
75.0000 mg | ORAL_TABLET | Freq: Every day | ORAL | Status: DC
  Administered 2013-08-07 – 2013-08-10 (×4): 75 mg via ORAL
  Filled 2013-08-07 (×5): qty 1

## 2013-08-07 MED ORDER — IBUPROFEN 800 MG PO TABS: 800.0000 mg | ORAL_TABLET | Freq: Once | ORAL | Status: DC

## 2013-08-07 MED ORDER — ALBUTEROL SULFATE HFA 108 (90 BASE) MCG/ACT IN AERS: 2.0000 | INHALATION_SPRAY | Freq: Four times a day (QID) | RESPIRATORY_TRACT | Status: DC | PRN

## 2013-08-07 MED ORDER — SODIUM CHLORIDE 0.9 % IJ SOLN
3.0000 mL | Freq: Two times a day (BID) | INTRAMUSCULAR | Status: DC
  Administered 2013-08-07 – 2013-08-10 (×7): 3 mL via INTRAVENOUS

## 2013-08-07 NOTE — Progress Notes (Signed)
Patient admitted to 4E via stretcher from ED.  A & O x4.  Patient placed on tele. RN will continue to monitor. Louretta Parma, RN

## 2013-08-07 NOTE — Progress Notes (Signed)
Pt have some N/V. MD paged for Zofran.

## 2013-08-07 NOTE — Progress Notes (Signed)
[facility] PROGRESS NOTE Interim History: 79 y.o. female with hx of severe CAD, s/p NSTEMI one month ago, culminated in a heart catheterization showing triple vessel disease, presents to the ER with DOE, orthopnea, and found to be in CHF. After her cath last month, discussion concerning CABG ensued, but family didn't want it. She was subsequently had stent placed, and has been compliant with her meds. She also has COPD, and has home oxygen to use PRN. Evaluation in the ER included a BNP of 2400, Cr of 1.2, normal WBC with Hb of 11.6 g/DL, and CXR showed vascular congestion with patchy infiltrate and a small left pleural effusion. Her EKG showed SR with no acute ST-T changes.    Intake/Output Summary (Last 24 hours) at 08/07/13 1254 Last data filed at 08/07/13 1208  Gross per 24 hour  Intake    240 ml  Output    925 ml  Net   -685 ml       Filed Weights   08/07/13 0651  Weight: 69.037 kg (152 lb 3.2 oz)        Recent Labs  07/04/13 0810 07/07/13 2038 08/07/13 0340  PROBNP 1436.0* 1731.0* 2804.0*     Assessment/Plan: Acute respiratory failure /Acute systolic heart failure - weight on last admission 69 kg. Estimated dry weight 65.5 kg. - on IV lasix, metoprolol and imdur. Will need ACE as an outpatient. - b-me tin amd, replete K.   - due to none complaint with diet  COPD (chronic obstructive pulmonary disease): - stable.  Coronary artery disease - cont ASA and plavix.   Code Status: full Family Communication: none  Disposition Plan: inpatinet   Consultants:  none  Procedures: ECHO: none  Antibiotics:  none  HPI/Subjective: No complains.  Objective: Filed Vitals:   08/07/13 0651 08/07/13 0844 08/07/13 1010 08/07/13 1212  BP: 142/68  134/60   Pulse: 92  93   Temp: 98.5 F (36.9 C)     TempSrc: Oral     Resp: 21     Height: 5\' 1"  (1.549 m)     Weight: 69.037 kg (152 lb 3.2 oz)     SpO2: 98% 97%  97%     Exam:  General: Alert, awake,  oriented x3, in no acute distress.  HEENT: No bruits, no goiter. +JVD Heart: Regular rate and rhythm, without murmurs, rubs, gallops.  Lungs: Good air movement, bilateral air movement.  Abdomen: Soft, nontender, nondistended, positive bowel sounds.  Neuro: Grossly intact, nonfocal.   Data Reviewed: Basic Metabolic Panel:  Recent Labs Lab 08/07/13 0340  NA 144  K 3.5  CL 100  CO2 32  GLUCOSE 153*  BUN 26*  CREATININE 1.23*  CALCIUM 9.5   Liver Function Tests:  Recent Labs Lab 08/07/13 0340  AST 16  ALT 10  ALKPHOS 88  BILITOT 0.4  PROT 6.5  ALBUMIN 3.6   No results found for this basename: LIPASE, AMYLASE,  in the last 168 hours No results found for this basename: AMMONIA,  in the last 168 hours CBC:  Recent Labs Lab 08/07/13 0340  WBC 8.4  NEUTROABS 7.0  HGB 11.6*  HCT 35.2*  MCV 86.3  PLT 192   Cardiac Enzymes:  Recent Labs Lab 08/07/13 0340 08/07/13 0652  TROPONINI <0.30 <0.30   BNP (last 3 results)  Recent Labs  07/04/13 0810 07/07/13 2038 08/07/13 0340  PROBNP 1436.0* 1731.0* 2804.0*   CBG: No results found for this basename: GLUCAP,  in the last 168  hours  No results found for this or any previous visit (from the past 240 hour(s)).   Studies: Dg Chest 2 View  08/07/2013   CLINICAL DATA:  Shortness of breast.  EXAM: CHEST  2 VIEW  COMPARISON:  07/07/2013  FINDINGS: Heart is borderline in size. The the patchy perihilar and lower lobe airspace opacities. Cannot exclude edema. Small left pleural effusion. No acute bony abnormality.  IMPRESSION: Cardiomegaly with vascular congestion. Patchy bilateral perihilar and lower lobe opacities could represent edema and/or atelectasis. Small left effusion.   Electronically Signed   By: Charlett Nose M.D.   On: 08/07/2013 04:24    Scheduled Meds: . albuterol  2.5 mg Nebulization Q4H  . albuterol      . aspirin EC  81 mg Oral Daily  . cholecalciferol  1,000 Units Oral Daily  . clopidogrel  75 mg  Oral Q breakfast  . famotidine  20 mg Oral Daily  . ferrous sulfate  325 mg Oral Q breakfast  . furosemide  40 mg Intravenous Daily  . heparin  5,000 Units Subcutaneous Q8H  . ipratropium      . isosorbide mononitrate  60 mg Oral Daily  . metoprolol tartrate  25 mg Oral BID  . mometasone-formoterol  2 puff Inhalation BID  . nitroGLYCERIN  1 inch Topical Q6H  . pantoprazole  40 mg Oral Daily  . potassium chloride SA  20 mEq Oral BID  . simvastatin  10 mg Oral q1800  . sodium chloride  3 mL Intravenous Q12H  . tiotropium  18 mcg Inhalation Daily   Continuous Infusions:    Marinda Elk  [facility] Pager [phone]. If 8PM-8AM, please contact night-coverage at [email or web address], password [facility] 08/07/2013, 12:54 PM  LOS: 0 days

## 2013-08-07 NOTE — Progress Notes (Signed)
Patient evaluated for community based chronic disease management services with [facility] Care Management Program as a benefit of patient's [employer]. Spoke with patient and daughter Efraim Kaufmann [facility] RN [facility] Liaison) at bedside to explain [facility] Care Management services.  She indicated that she would like to have some education on her diet because she struggles with compliance.  She has a scale, can obtain medicines, and is able to get to all appointments without difficulty.  In addition, her daughter would like her to develop a better understanding of how and when to intervene on her CHF symptoms.  Patient will receive a post discharge transition of care call and will be evaluated for monthly home visits for assessments and disease process education.  Left contact information and [facility] literature at bedside. Made inpatient Case Manager aware that [facility] Care Management following. Of note, [facility] Care Management services does not replace or interfere with any services that are arranged by inpatient case management or social work.  For additional questions or referrals please contact Anibal Henderson BSN RN [facility] Liaison at [phone].

## 2013-08-07 NOTE — ED Provider Notes (Signed)
CSN: [id number]     Arrival date & time 08/07/13  7829 History   First MD Initiated Contact with Patient 08/07/13 [phone]     Chief Complaint  Patient presents with  . Shortness of Breath   (Consider location/radiation/quality/duration/timing/severity/associated sxs/prior Treatment) HPI Comments: Patient with past medical history significant for coronary artery disease, COPD, CHF. She presents tonight by EMS with complaints of shortness of breath which started abruptly 2 hours prior to arrival. She was recently hospitalized for similar complaints and was found to have a non-ST elevation MI. Heart cath was performed and revealed 3 vessel disease for which the decision was made to manage medically.  Patient is a 79 y.o. female presenting with shortness of breath. The history is provided by the patient.  Shortness of Breath Severity:  Moderate Onset quality:  Sudden Duration:  2 hours Timing:  Constant Progression:  Worsening Chronicity:  Recurrent Relieved by:  Nothing Worsened by:  Nothing tried Ineffective treatments:  None tried   Past Medical History  Diagnosis Date  . DVT (deep venous thrombosis)   . Acid reflux   . Hypertension   . Hypercholesterolemia   . Anginal pain   . Asthma   . Hyponatremia   . Congestive heart failure   . PONV (postoperative nausea and vomiting)   . Myocardial infarction 2014; 07/03/2013  . COPD (chronic obstructive pulmonary disease)   . Pneumonia     "as a child and again in 2013" (07/04/2013)  . Chronic bronchitis     "once or twice/yr" (07/04/2013)  . Exertional shortness of breath   . Pernicious anemia   . History of blood transfusion     "cause my blood was low" (07/04/2013)  . HYQMVHQI(696.2)     "probably weekly" (07/04/2013)  . Acoustic neuroma     "Dr. Suzanna Obey is testing for this in her right ear right now" (07/04/2013)  . Deafness in right ear   . Arthritis     "left knee; back" (07/04/2013)  . Lumbar compression fracture 2010; 2012     "fell; fell" (07/04/2013)  . Anxiety   . Chronic cystitis    Past Surgical History  Procedure Laterality Date  . Abdominal surgery  1960's    "tumor removed" (07/04/2013)  . Inguinal hernia repair Right   . Vesicovaginal fistula closure w/ tah    . Breast biopsy Left 1980's  . Knee arthroscopy Left 1980's  . Cataract extraction, bilateral Bilateral ~ 2011  . Tonsillectomy  1930's  . Cholecystectomy    . Appendectomy    . Bladder suspension  05/16/2005    Hattie Perch 05/16/2005 (07/04/2013)  . Femoral-popliteal bypass graft Left 1990's  . Cardiac catheterization  2012  . Coronary angioplasty with stent placement  07/10/2013    "1" (07/10/2013)   Family History  Problem Relation Age of Onset  . Emphysema Brother     2 brothers  . Emphysema Father   . Heart failure Mother   . Diabetes Mother    History  Substance Use Topics  . Smoking status: Never Smoker   . Smokeless tobacco: Never Used  . Alcohol Use: No   OB History   Grav Para Term Preterm Abortions TAB SAB Ect Mult Living                 Review of Systems  Respiratory: Positive for shortness of breath.   All other systems reviewed and are negative.    Allergies  Sulfa antibiotics  Home Medications  Current Outpatient Rx  Name  Route  Sig  Dispense  Refill  . acetaminophen (TYLENOL) 325 MG tablet   Oral   Take 650 mg by mouth every 6 (six) hours as needed for pain.         Marland Kitchen albuterol (PROVENTIL HFA;VENTOLIN HFA) 108 (90 BASE) MCG/ACT inhaler   Inhalation   Inhale 2 puffs into the lungs every 6 (six) hours as needed for wheezing.         Marland Kitchen albuterol (PROVENTIL) (5 MG/ML) 0.5% nebulizer solution   Nebulization   Take 0.5 mLs (2.5 mg total) by nebulization 3 (three) times daily.   20 mL      . aspirin EC 81 MG tablet   Oral   Take 81 mg by mouth daily.         . Cholecalciferol (VITAMIN D) 1000 UNITS capsule   Oral   Take 1,000 Units by mouth daily.         . clopidogrel (PLAVIX) 75 MG  tablet   Oral   Take 75 mg by mouth daily.         . ferrous sulfate 325 (65 FE) MG tablet   Oral   Take 325 mg by mouth daily with breakfast.           . Fluticasone-Salmeterol (ADVAIR) 100-50 MCG/DOSE AEPB   Inhalation   Inhale 1 puff into the lungs every 12 (twelve) hours.         . furosemide (LASIX) 40 MG tablet   Oral   Take 1 tablet (40 mg total) by mouth daily.   30 tablet      . isosorbide mononitrate (IMDUR) 30 MG 24 hr tablet   Oral   Take 2 tablets (60 mg total) by mouth daily.   30 tablet   11   . metoprolol tartrate (LOPRESSOR) 25 MG tablet   Oral   Take 25 mg by mouth 2 (two) times daily.         . nitroGLYCERIN (NITROSTAT) 0.4 MG SL tablet   Sublingual   Place 0.4 mg under the tongue every 5 (five) minutes as needed.           . pantoprazole (PROTONIX) 40 MG tablet   Oral   Take 40 mg by mouth daily.         . potassium chloride (KLOR-CON) 20 MEQ packet   Oral   Take 20 mEq by mouth 2 (two) times daily.         . pravastatin (PRAVACHOL) 20 MG tablet   Oral   Take 20 mg by mouth daily.         . ranitidine (ZANTAC) 150 MG tablet   Oral   Take 150 mg by mouth 2 (two) times daily.           Marland Kitchen tiotropium (SPIRIVA) 18 MCG inhalation capsule   Inhalation   Place 18 mcg into inhaler and inhale daily.          BP 150/60  Pulse 82  Resp 23  SpO2 98% Physical Exam  Nursing note and vitals reviewed. Constitutional: She is oriented to person, place, and time. She appears well-developed and well-nourished. No distress.  HENT:  Head: Normocephalic and atraumatic.  Neck: Normal range of motion. Neck supple.  Cardiovascular: Normal rate and regular rhythm.  Exam reveals no gallop and no friction rub.   No murmur heard. Pulmonary/Chest: Effort normal. No respiratory distress. She has no wheezes.  There are  rales in the bilateral bases.  Abdominal: Soft. Bowel sounds are normal. She exhibits no distension. There is no tenderness.   Musculoskeletal: Normal range of motion.  Neurological: She is alert and oriented to person, place, and time.  Skin: Skin is warm and dry. She is not diaphoretic.    ED Course  Procedures (including critical care time) Labs Review Labs Reviewed  CBC WITH DIFFERENTIAL  COMPREHENSIVE METABOLIC PANEL  TROPONIN I  PRO B NATRIURETIC PEPTIDE   Imaging Review No results found.   Date: 08/07/2013  Rate: 82  Rhythm: normal sinus rhythm  QRS Axis: left  Intervals: normal  ST/T Wave abnormalities: nonspecific T wave changes  Conduction Disutrbances:none  Narrative Interpretation:   Old EKG Reviewed: unchanged    MDM  No diagnosis found. Patient with history of copd, chf, cad.  Presents with acute onset dyspnea.  Recently hospitalized for same.  She was placed on bipap in the ambulance en route to the ED, but switched over to NRB.  She is now feeling better.  Oxygen turned down to 6L, but when oxygen is turned lower she desaturates.  Workup reveals an elevated bnp.  She was given lasix and duoneb in the ED with good results.  She continues to have an oxygen requirement, will consult internal medicine for admission.  Her troponin is negative and ekg is unchanged, does not appear at this time to be a cardiac event.      Geoffery Lyons, MD 08/07/13 [phone]

## 2013-08-07 NOTE — ED Notes (Signed)
Patient brought in by [facility], found patient unable to breath or catch breath, started on 4 liters of O2 and Cpap,

## 2013-08-07 NOTE — H&P (Signed)
[facility] History and Physical  [name] ZOX:[id number] DOB: 16-Feb-1926    PCP:   [name], MD   Chief Complaint: shortness of breath and orthopnea.  HPI: [name] is an 79 y.o. female with hx of severe CAD, s/p NSTEMI one month ago, culminated in a heart catheterization showing triple vessel disease, presents to the ER with DOE, orthopnea, and found to be in CHF.  After her cath last month, discussion concerning CABG ensued, but family didn't want it.  She was subsequently had stent placed, and has been compliant with her meds.  She also has COPD, and has home oxygen to use PRN.  Evaluation in the ER included a BNP of 2400, Cr of 1.2, normal WBC with Hb of 11.6 g/DL, and CXR showed vascular congestion with patchy infiltrate and a small left pleural effusion.  Her EKG showed SR with no acute ST-T changes.  She was given IV Lasix and had significant improvement, but still has some desat (85% RA), along with persistent bibasilar rales.  Rewiew of Systems:  Constitutional: Negative for malaise, fever and chills. No significant weight loss or weight gain Eyes: Negative for eye pain, redness and discharge, diplopia, visual changes, or flashes of light. ENMT: Negative for ear pain, hoarseness, nasal congestion, sinus pressure and sore throat. No headaches; tinnitus, drooling, or problem swallowing. Cardiovascular: Negative for chest pain, palpitations, diaphoresis. Respiratory: Negative for hemoptysis,  and stridor. No pleuritic chestpain. Gastrointestinal: Negative for nausea, vomiting, diarrhea, constipation, abdominal pain, melena, blood in stool, hematemesis, jaundice and rectal bleeding.    Genitourinary: Negative for frequency, dysuria, incontinence,flank pain and hematuria; Musculoskeletal: Negative for back pain and neck pain. Negative for swelling and trauma.;  Skin: . Negative for pruritus, rash, abrasions, bruising and skin lesion.; ulcerations Neuro:  Negative for headache, lightheadedness and neck stiffness. Negative for weakness, altered level of consciousness , altered mental status, extremity weakness, burning feet, involuntary movement, seizure and syncope.  Psych: negative for anxiety, depression, insomnia, tearfulness, panic attacks, hallucinations, paranoia, suicidal or homicidal ideation    Past Medical History  Diagnosis Date  . DVT (deep venous thrombosis)   . Acid reflux   . Hypertension   . Hypercholesterolemia   . Anginal pain   . Asthma   . Hyponatremia   . Congestive heart failure   . PONV (postoperative nausea and vomiting)   . Myocardial infarction 2014; 07/03/2013  . COPD (chronic obstructive pulmonary disease)   . Pneumonia     "as a child and again in 2013" (07/04/2013)  . Chronic bronchitis     "once or twice/yr" (07/04/2013)  . Exertional shortness of breath   . Pernicious anemia   . History of blood transfusion     "cause my blood was low" (07/04/2013)  . WJXBJYNW(295.6)     "probably weekly" (07/04/2013)  . Acoustic neuroma     "Dr. Suzanna Obey is testing for this in her right ear right now" (07/04/2013)  . Deafness in right ear   . Arthritis     "left knee; back" (07/04/2013)  . Lumbar compression fracture 2010; 2012    "fell; fell" (07/04/2013)  . Anxiety   . Chronic cystitis     Past Surgical History  Procedure Laterality Date  . Abdominal surgery  1960's    "tumor removed" (07/04/2013)  . Inguinal hernia repair Right   . Vesicovaginal fistula closure w/ tah    . Breast biopsy Left 1980's  . Knee arthroscopy Left 1980's  .  Cataract extraction, bilateral Bilateral ~ 2011  . Tonsillectomy  1930's  . Cholecystectomy    . Appendectomy    . Bladder suspension  05/16/2005    Hattie Perch 05/16/2005 (07/04/2013)  . Femoral-popliteal bypass graft Left 1990's  . Cardiac catheterization  2012  . Coronary angioplasty with stent placement  07/10/2013    "1" (07/10/2013)    Medications:  HOME MEDS: Prior to  Admission medications   Medication Sig Start Date End Date Taking? Authorizing Provider  acetaminophen (TYLENOL) 325 MG tablet Take 650 mg by mouth every 6 (six) hours as needed for pain.   Yes Historical Provider, MD  albuterol (PROVENTIL HFA;VENTOLIN HFA) 108 (90 BASE) MCG/ACT inhaler Inhale 2 puffs into the lungs every 6 (six) hours as needed for wheezing.   Yes Historical Provider, MD  albuterol (PROVENTIL) (5 MG/ML) 0.5% nebulizer solution Take 0.5 mLs (2.5 mg total) by nebulization 3 (three) times daily. 02/28/13  Yes Sorin Luanne Bras, MD  aspirin EC 81 MG tablet Take 81 mg by mouth daily.   Yes Historical Provider, MD  Cholecalciferol (VITAMIN D) 1000 UNITS capsule Take 1,000 Units by mouth daily.   Yes Historical Provider, MD  clopidogrel (PLAVIX) 75 MG tablet Take 75 mg by mouth daily.   Yes Historical Provider, MD  ferrous sulfate 325 (65 FE) MG tablet Take 325 mg by mouth daily with breakfast.     Yes Historical Provider, MD  Fluticasone-Salmeterol (ADVAIR) 100-50 MCG/DOSE AEPB Inhale 1 puff into the lungs every 12 (twelve) hours.   Yes Historical Provider, MD  furosemide (LASIX) 40 MG tablet Take 1 tablet (40 mg total) by mouth daily. 02/28/13  Yes Sorin Luanne Bras, MD  isosorbide mononitrate (IMDUR) 30 MG 24 hr tablet Take 2 tablets (60 mg total) by mouth daily. 07/06/13  Yes Lyn Records III, MD  metoprolol tartrate (LOPRESSOR) 25 MG tablet Take 25 mg by mouth 2 (two) times daily.   Yes Historical Provider, MD  nitroGLYCERIN (NITROSTAT) 0.4 MG SL tablet Place 0.4 mg under the tongue every 5 (five) minutes as needed.     Yes Historical Provider, MD  pantoprazole (PROTONIX) 40 MG tablet Take 40 mg by mouth daily.   Yes Historical Provider, MD  potassium chloride (KLOR-CON) 20 MEQ packet Take 20 mEq by mouth 2 (two) times daily.   Yes Historical Provider, MD  pravastatin (PRAVACHOL) 20 MG tablet Take 20 mg by mouth daily.   Yes Historical Provider, MD  ranitidine (ZANTAC) 150 MG tablet Take 150 mg  by mouth 2 (two) times daily.     Yes Historical Provider, MD  tiotropium (SPIRIVA) 18 MCG inhalation capsule Place 18 mcg into inhaler and inhale daily.   Yes Historical Provider, MD     Allergies:  Allergies  Allergen Reactions  . Sulfa Antibiotics Other (See Comments)    unknown    Social History:   reports that she has never smoked. She has never used smokeless tobacco. She reports that she does not drink alcohol or use illicit drugs.  Family History: Family History  Problem Relation Age of Onset  . Emphysema Brother     2 brothers  . Emphysema Father   . Heart failure Mother   . Diabetes Mother      Physical Exam: Filed Vitals:   08/07/13 0341 08/07/13 0415 08/07/13 0430 08/07/13 0445  BP:  120/57 135/57 129/60  Pulse:  84 85 87  Resp:  19 27 30   SpO2: 98% 94% 95% 96%   Blood  pressure 129/60, pulse 87, resp. rate 30, SpO2 96.00%.  GEN:  Pleasant  patient lying in the stretcher in no acute distress; cooperative with exam. PSYCH:  alert and oriented x4; does not appear anxious or depressed; affect is appropriate. HEENT: Mucous membranes pink and anicteric; PERRLA; EOM intact; no cervical lymphadenopathy nor thyromegaly or carotid bruit; no JVD; There were no stridor. Neck is very supple. Breasts:: Not examined CHEST WALL: No tenderness CHEST: Normal respiration, bibisilar rales, minimal wheezing. HEART: Regular rate and rhythm.  There are no murmur, rub, or gallops.   BACK: No kyphosis or scoliosis; no CVA tenderness ABDOMEN: soft and non-tender; no masses, no organomegaly, normal abdominal bowel sounds; no pannus; no intertriginous candida. There is no rebound and no distention. Rectal Exam: Not done EXTREMITIES: No bone or joint deformity; age-appropriate arthropathy of the hands and knees; no edema; no ulcerations.  There is no calf tenderness. Genitalia: not examined PULSES: 2+ and symmetric SKIN: Normal hydration no rash or ulceration CNS: Cranial nerves 2-12  grossly intact no focal lateralizing neurologic deficit.  Speech is fluent; uvula elevated with phonation, facial symmetry and tongue midline. DTR are normal bilaterally, cerebella exam is intact, barbinski is negative and strengths are equaled bilaterally.  No sensory loss.   Labs on Admission:  Basic Metabolic Panel:  Recent Labs Lab 08/07/13 0340  NA 144  K 3.5  CL 100  CO2 32  GLUCOSE 153*  BUN 26*  CREATININE 1.23*  CALCIUM 9.5   Liver Function Tests:  Recent Labs Lab 08/07/13 0340  AST 16  ALT 10  ALKPHOS 88  BILITOT 0.4  PROT 6.5  ALBUMIN 3.6   No results found for this basename: LIPASE, AMYLASE,  in the last 168 hours No results found for this basename: AMMONIA,  in the last 168 hours CBC:  Recent Labs Lab 08/07/13 0340  WBC 8.4  NEUTROABS 7.0  HGB 11.6*  HCT 35.2*  MCV 86.3  PLT 192   Cardiac Enzymes:  Recent Labs Lab 08/07/13 0340  TROPONINI <0.30    CBG: No results found for this basename: GLUCAP,  in the last 168 hours   Radiological Exams on Admission: Dg Chest 2 View  08/07/2013   CLINICAL DATA:  Shortness of breast.  EXAM: CHEST  2 VIEW  COMPARISON:  07/07/2013  FINDINGS: Heart is borderline in size. The the patchy perihilar and lower lobe airspace opacities. Cannot exclude edema. Small left pleural effusion. No acute bony abnormality.  IMPRESSION: Cardiomegaly with vascular congestion. Patchy bilateral perihilar and lower lobe opacities could represent edema and/or atelectasis. Small left effusion.   Electronically Signed   By: Charlett Nose M.D.   On: 08/07/2013 04:24    EKG: Independently reviewed. SR with nonspecific ST-T changes.   Assessment/Plan Present on Admission:  . Acute systolic heart failure . Acute respiratory failure . Congestive heart failure . COPD (chronic obstructive pulmonary disease) . NSTEMI (non-ST elevated myocardial infarction) . Coronary artery disease  PLAN:  Her shortness of breath is likely a  combination of CHF and COPD, but I think more of heart failure than COPD exacerbation.  She has severe ischemic CAD (triple vessel), but from the recent admission, it seems she is a candidate for medical treatment.  Will continue to diurese her more, cycle her troponins, along with treating her COPD.  I have continued her home meds, and added NTP tonight.  Dr Johann Capers is her cardiologist, and I would consult him later today.  She is stable, full  code, and will be admitted to telemetry under [facility] service.  Thank you for allowing to partake in the care of your nice patient.  Other plans as per orders.  Code Status: FULL Unk Lightning, MD. [facility] Pager [phone] 7pm to 7am.  08/07/2013, 6:07 AM

## 2013-08-07 NOTE — Progress Notes (Signed)
Utilization Review Completed.   Cannon Arreola, RN, BSN Nurse Case Manager  [phone]  

## 2013-08-08 DIAGNOSIS — I5021 Acute systolic (congestive) heart failure: Principal | ICD-10-CM

## 2013-08-08 DIAGNOSIS — R0609 Other forms of dyspnea: Secondary | ICD-10-CM

## 2013-08-08 DIAGNOSIS — E871 Hypo-osmolality and hyponatremia: Secondary | ICD-10-CM

## 2013-08-08 LAB — BASIC METABOLIC PANEL
Calcium: 9.8 mg/dL (ref 8.4–10.5)
GFR calc Af Amer: 45 mL/min — ABNORMAL LOW (ref >90)
GFR calc non Af Amer: 39 mL/min — ABNORMAL LOW (ref >90)
Glucose, Bld: 117 mg/dL — ABNORMAL HIGH (ref 70–99)
Potassium: 3.3 mEq/L — ABNORMAL LOW (ref 3.5–5.1)
Sodium: 139 mEq/L (ref 135–145)

## 2013-08-08 MED ORDER — POTASSIUM CHLORIDE CRYS ER 20 MEQ PO TBCR: 40.0000 meq | EXTENDED_RELEASE_TABLET | Freq: Two times a day (BID) | ORAL | Status: DC

## 2013-08-08 MED ORDER — FUROSEMIDE 40 MG PO TABS
40.0000 mg | ORAL_TABLET | Freq: Every day | ORAL | Status: DC
  Filled 2013-08-08: qty 1

## 2013-08-08 MED ORDER — FUROSEMIDE 10 MG/ML IJ SOLN
40.0000 mg | Freq: Two times a day (BID) | INTRAMUSCULAR | Status: AC
  Administered 2013-08-08 (×2): 40 mg via INTRAVENOUS

## 2013-08-08 MED ORDER — FUROSEMIDE 40 MG PO TABS
40.0000 mg | ORAL_TABLET | Freq: Every day | ORAL | Status: DC
  Administered 2013-08-09 – 2013-08-10 (×2): 40 mg via ORAL
  Filled 2013-08-08 (×2): qty 1

## 2013-08-08 MED ORDER — POTASSIUM CHLORIDE CRYS ER 20 MEQ PO TBCR
40.0000 meq | EXTENDED_RELEASE_TABLET | Freq: Two times a day (BID) | ORAL | Status: AC
  Administered 2013-08-08 (×2): 40 meq via ORAL
  Filled 2013-08-08: qty 2

## 2013-08-08 MED ORDER — FUROSEMIDE 10 MG/ML IJ SOLN: 40.0000 mg | Freq: Once | INTRAMUSCULAR | Status: DC

## 2013-08-08 MED ORDER — FUROSEMIDE 10 MG/ML IJ SOLN: 60.0000 mg | Freq: Once | INTRAMUSCULAR | Status: DC

## 2013-08-08 NOTE — Evaluation (Signed)
Physical Therapy Evaluation Patient Details Name: [name] MRN: [id number] DOB: [birth year] Today's Date: 08/08/2013 Time: 8657-8469 PT Time Calculation (min): 27 min  PT Assessment / Plan / Recommendation History of Present Illness  79 y.o. female with hx of severe CAD, s/p NSTEMI one month ago, culminated in a heart catheterization showing triple vessel disease, presents to the ER with DOE, orthopnea, and found to be in CHF. After her cath last month, discussion concerning CABG ensued, but family didn't want it. She was subsequently had stent placed, and has been compliant with her meds. She also has COPD, and has home oxygen to use PRN.  Clinical Impression  Pt admitted with the above. Pt currently with functional limitations due to the deficits listed below (see PT Problem List).  Pt will benefit from skilled PT to increase their independence and safety with mobility to allow discharge to the venue listed below.     PT Assessment  Patient needs continued PT services    Follow Up Recommendations  No PT follow up;Supervision - Intermittent    Equipment Recommendations  None recommended by PT    Frequency Min 3X/week    Pertinent Vitals/Pain Sa02 maintained > 90% on RA with mobility.       Mobility  Bed Mobility Bed Mobility: Supine to Sit;Sitting - Scoot to Edge of Bed Supine to Sit: 6: Modified independent (Device/Increase time) Sitting - Scoot to Edge of Bed: 6: Modified independent (Device/Increase time) Transfers Transfers: Sit to Stand;Stand to Sit Sit to Stand: 5: Supervision;From bed;From toilet Stand to Sit: 5: Supervision;To chair/3-in-1;To toilet Details for Transfer Assistance: Supervision for safety.  Cues for hand placement Ambulation/Gait Ambulation/Gait Assistance: 5: Supervision;6: Modified independent (Device/Increase time) Ambulation Distance (Feet): 150 Feet Assistive device: Rolling walker Ambulation/Gait Assistance Details: Supervision for  safety during turns and around obstacles.  Cues for body position within RW. Gait Pattern: Step-through pattern    Exercises     PT Diagnosis: Difficulty walking  PT Problem List: Decreased activity tolerance;Decreased balance;Decreased mobility;Decreased knowledge of use of DME;Cardiopulmonary status limiting activity PT Treatment Interventions: DME instruction;Gait training;Functional mobility training;Therapeutic activities;Therapeutic exercise;Balance training;Patient/family education     PT Goals(Current goals can be found in the care plan section) Acute Rehab PT Goals Patient Stated Goal: To get all the fluid off so I don't have to come back.  PT Goal Formulation: With patient Time For Goal Achievement: 08/15/13 Potential to Achieve Goals: Good  Visit Information  Last PT Received On: 08/08/13 Assistance Needed: +1 PT/OT Co-Evaluation/Treatment: Yes History of Present Illness: 79 y.o. female with hx of severe CAD, s/p NSTEMI one month ago, culminated in a heart catheterization showing triple vessel disease, presents to the ER with DOE, orthopnea, and found to be in CHF. After her cath last month, discussion concerning CABG ensued, but family didn't want it. She was subsequently had stent placed, and has been compliant with her meds. She also has COPD, and has home oxygen to use PRN.       Prior Functioning  Home Living Family/patient expects to be discharged to:: Private residence Living Arrangements: Spouse/significant other Available Help at Discharge: Family;Personal care attendant;Available 24 hours/day Type of Home: House Home Access: Level entry Home Layout: One level Home Equipment: Walker - 4 wheels Additional Comments: uses 4 wheeled walker when shopping Prior Function Level of Independence: Independent with assistive device(s) Comments: sitter there during the day, but doesn't cook or clean or do anything  but "sit there and stare at  me" Communication  Communication: HOH Dominant Hand: Right    Cognition  Cognition Arousal/Alertness: Awake/alert Behavior During Therapy: WFL for tasks assessed/performed Overall Cognitive Status: Within Functional Limits for tasks assessed    Extremity/Trunk Assessment Upper Extremity Assessment Upper Extremity Assessment: Overall WFL for tasks assessed Lower Extremity Assessment Lower Extremity Assessment: Overall WFL for tasks assessed Cervical / Trunk Assessment Cervical / Trunk Assessment: Normal   Balance Balance Balance Assessed: Yes Static Standing Balance Static Standing - Balance Support: During functional activity Static Standing - Level of Assistance: 5: Stand by assistance Static Standing - Comment/# of Minutes: ~1 minute to wash hands  End of Session    GP     Ciarah Peace 08/08/2013, 4:55 PM  Jake Shark, PT DPT [phone]

## 2013-08-08 NOTE — Evaluation (Signed)
Occupational Therapy Evaluation Patient Details Name: [name] MRN: [id number] DOB: 1926/04/06 Today's Date: 08/08/2013 Time: 1478-2956 OT Time Calculation (min): 15 min  OT Assessment / Plan / Recommendation History of present illness 79 y.o. female with hx of severe CAD, s/p NSTEMI one month ago, culminated in a heart catheterization showing triple vessel disease, presents to the ER with DOE, orthopnea, and found to be in CHF. After her cath last month, discussion concerning CABG ensued, but family didn't want it. She was subsequently had stent placed, and has been compliant with her meds. She also has COPD, and has home oxygen to use PRN.   Clinical Impression   Pt admitted with above.  Pt performing ADLs and functional mobility at supervision level.  No further acute OT needs at this time. Will sign off.    OT Assessment  Patient does not need any further OT services    Follow Up Recommendations  No OT follow up;Supervision - Intermittent    Barriers to Discharge      Equipment Recommendations  None recommended by OT    Recommendations for Other Services    Frequency       Precautions / Restrictions     Pertinent Vitals/Pain SpO2 94% at rest on 2L O2 nasal cannula.  SpO2 on RA >90% during activity.    ADL  Grooming: Performed;Wash/dry hands;Supervision/safety Where Assessed - Grooming: Unsupported standing Upper Body Bathing: Simulated;Supervision/safety Where Assessed - Upper Body Bathing: Unsupported sitting Lower Body Bathing: Simulated;Supervision/safety Where Assessed - Lower Body Bathing: Unsupported sit to stand Upper Body Dressing: Performed;Supervision/safety Where Assessed - Upper Body Dressing: Unsupported standing Lower Body Dressing: Performed;Supervision/safety Where Assessed - Lower Body Dressing: Unsupported sit to stand Toilet Transfer: Performed;Supervision/safety Toilet Transfer Method: Sit to Barista: Regular height  toilet Toileting - Clothing Manipulation and Hygiene: Performed;Supervision/safety Where Assessed - Engineer, mining and Hygiene: Sit to stand from 3-in-1 or toilet Equipment Used: Gait belt;Rolling walker Transfers/Ambulation Related to ADLs: supervision with RW ADL Comments: Pt reports she feels at baseline.     OT Diagnosis:    OT Problem List:   OT Treatment Interventions:     OT Goals(Current goals can be found in the care plan section)    Visit Information  Last OT Received On: 08/08/13 Assistance Needed: +1 History of Present Illness: 79 y.o. female with hx of severe CAD, s/p NSTEMI one month ago, culminated in a heart catheterization showing triple vessel disease, presents to the ER with DOE, orthopnea, and found to be in CHF. After her cath last month, discussion concerning CABG ensued, but family didn't want it. She was subsequently had stent placed, and has been compliant with her meds. She also has COPD, and has home oxygen to use PRN.       Prior Functioning     Home Living Family/patient expects to be discharged to:: Private residence Living Arrangements: Spouse/significant other Available Help at Discharge: Family;Personal care attendant;Available 24 hours/day Type of Home: House Home Access: Level entry Home Layout: One level Home Equipment: Shower seat - built in Additional Comments: uses 4 wheeled walker when shopping Prior Function Level of Independence: Independent with assistive device(s) Comments: sitter there during the day, but doesn't cook or clean or do anything  but "sit there and stare at me" Communication Communication: HOH Dominant Hand: Right         Vision/Perception     Cognition  Cognition Arousal/Alertness: Awake/alert Behavior During Therapy: WFL for tasks assessed/performed Overall Cognitive  Status: Within Functional Limits for tasks assessed    Extremity/Trunk Assessment Upper Extremity Assessment Upper  Extremity Assessment: Overall WFL for tasks assessed Lower Extremity Assessment Lower Extremity Assessment: Overall WFL for tasks assessed Cervical / Trunk Assessment Cervical / Trunk Assessment: Normal     Mobility Bed Mobility Bed Mobility: Supine to Sit;Sitting - Scoot to Edge of Bed Supine to Sit: 6: Modified independent (Device/Increase time) Sitting - Scoot to Edge of Bed: 6: Modified independent (Device/Increase time) Transfers Transfers: Sit to Stand;Stand to Sit Sit to Stand: 5: Supervision;From bed;From toilet Stand to Sit: 5: Supervision;To toilet Details for Transfer Assistance: Supervision for safety.  Cues for hand placement     Exercise     Balance Balance Balance Assessed: Yes Static Standing Balance Static Standing - Balance Support: During functional activity Static Standing - Level of Assistance: 5: Stand by assistance Static Standing - Comment/# of Minutes: ~1 minute to wash hands   End of Session OT - End of Session Equipment Utilized During Treatment: Gait belt;Rolling walker Activity Tolerance: Patient tolerated treatment well Patient left:  (walking with PT) Nurse Communication: Mobility status  GO   08/08/2013 Cipriano Mile OTR/L Pager [phone] Office [phone]   Cipriano Mile 08/08/2013, 4:13 PM

## 2013-08-08 NOTE — Progress Notes (Signed)
[facility] PROGRESS NOTE Interim History: 79 y.o. female with hx of severe CAD, s/p NSTEMI one month ago, culminated in a heart catheterization showing triple vessel disease, presents to the ER with DOE, orthopnea, and found to be in CHF. After her cath last month, discussion concerning CABG ensued, but family didn't want it. She was subsequently had stent placed, and has been compliant with her meds. She also has COPD, and has home oxygen to use PRN. Evaluation in the ER included a BNP of 2400, Cr of 1.2, normal WBC with Hb of 11.6 g/DL, and CXR showed vascular congestion with patchy infiltrate and a small left pleural effusion. Her EKG showed SR with no acute ST-T changes  Filed Weights   08/07/13 0651 08/08/13 0516  Weight: 69.037 kg (152 lb 3.2 oz) 68.7 kg (151 lb 7.3 oz)       Recent Labs  07/04/13 0810 07/07/13 2038 08/07/13 0340  PROBNP 1436.0* 1731.0* 2804.0*     Assessment/Plan: Acute respiratory failure /Acute systolic heart failure  - Weight cont to improve. Estimated dry weight 65.5 kg.  - on IV lasix, metoprolol and imdur. Will need ACE as an outpatient.  - b-me in am, replete K.  - due to none complaint with diet   COPD (chronic obstructive pulmonary disease):  - stable.   Coronary artery disease  - cont ASA and plavix   Code Status: full  Family Communication: none  Disposition Plan: inpatinet  Consultants:  none  Procedures: ECHO:none  Antibiotics:  none (indicate start date, and stop date if known)  HPI/Subjective: No complains  Objective: Filed Vitals:   08/07/13 1808 08/07/13 2009 08/07/13 2112 08/08/13 0516  BP: 107/49  120/55 104/53  Pulse: 85  94 95  Temp:   98.6 F (37 C) 98.4 F (36.9 C)  TempSrc:   Oral Oral  Resp: 18  18 21   Height:      Weight:    68.7 kg (151 lb 7.3 oz)  SpO2: 96% 99% 96% 98%     Exam:  General: Alert, awake, oriented x3, in no acute distress.  HEENT: No bruits, no goiter. JVD Heart: Regular  rate and rhythm, without murmurs, rubs, gallops.  Lungs: Good air movement, bilateral air movement.  Abdomen: Soft, nontender, nondistended, positive bowel sounds.  Neuro: Grossly intact, nonfocal.   Data Reviewed: Basic Metabolic Panel:  Recent Labs Lab 08/07/13 0340 08/08/13 0520  NA 144 139  K 3.5 3.3*  CL 100 98  CO2 32 29  GLUCOSE 153* 117*  BUN 26* 22  CREATININE 1.23* 1.23*  CALCIUM 9.5 9.8   Liver Function Tests:  Recent Labs Lab 08/07/13 0340  AST 16  ALT 10  ALKPHOS 88  BILITOT 0.4  PROT 6.5  ALBUMIN 3.6   No results found for this basename: LIPASE, AMYLASE,  in the last 168 hours No results found for this basename: AMMONIA,  in the last 168 hours CBC:  Recent Labs Lab 08/07/13 0340  WBC 8.4  NEUTROABS 7.0  HGB 11.6*  HCT 35.2*  MCV 86.3  PLT 192   Cardiac Enzymes:  Recent Labs Lab 08/07/13 0340 08/07/13 0652 08/07/13 1357 08/07/13 1834  TROPONINI <0.30 <0.30 <0.30 <0.30   BNP (last 3 results)  Recent Labs  07/04/13 0810 07/07/13 2038 08/07/13 0340  PROBNP 1436.0* 1731.0* 2804.0*   CBG: No results found for this basename: GLUCAP,  in the last 168 hours  No results found for this or any previous visit (from the  past 240 hour(s)).   Studies: Dg Chest 2 View  08/07/2013   CLINICAL DATA:  Shortness of breast.  EXAM: CHEST  2 VIEW  COMPARISON:  07/07/2013  FINDINGS: Heart is borderline in size. The the patchy perihilar and lower lobe airspace opacities. Cannot exclude edema. Small left pleural effusion. No acute bony abnormality.  IMPRESSION: Cardiomegaly with vascular congestion. Patchy bilateral perihilar and lower lobe opacities could represent edema and/or atelectasis. Small left effusion.   Electronically Signed   By: Charlett Nose M.D.   On: 08/07/2013 04:24    Scheduled Meds: . albuterol  2.5 mg Nebulization TID  . aspirin EC  81 mg Oral Daily  . cholecalciferol  1,000 Units Oral Daily  . clopidogrel  75 mg Oral Q breakfast   . famotidine  20 mg Oral Daily  . ferrous sulfate  325 mg Oral Q breakfast  . furosemide  40 mg Intravenous Q12H  . heparin  5,000 Units Subcutaneous Q8H  . influenza vac split quadrivalent PF  0.5 mL Intramuscular Tomorrow-1000  . isosorbide mononitrate  60 mg Oral Daily  . metoprolol tartrate  25 mg Oral BID  . mometasone-formoterol  2 puff Inhalation BID  . nitroGLYCERIN  1 inch Topical Q6H  . pantoprazole  40 mg Oral Daily  . potassium chloride SA  20 mEq Oral BID  . potassium chloride  40 mEq Oral BID  . simvastatin  10 mg Oral q1800  . sodium chloride  3 mL Intravenous Q12H  . tiotropium  18 mcg Inhalation Daily   Continuous Infusions:    Marinda Elk  [facility] Pager [phone]. If 8PM-8AM, please contact night-coverage at [email or web address], password [facility] 08/08/2013, 10:20 AM  LOS: 1 day

## 2013-08-09 ENCOUNTER — Encounter (HOSPITAL_COMMUNITY): Payer: Self-pay

## 2013-08-09 LAB — BASIC METABOLIC PANEL
BUN: 20 mg/dL (ref 6–23)
Calcium: 10.1 mg/dL (ref 8.4–10.5)
Chloride: 102 mEq/L (ref 96–112)
GFR calc Af Amer: 44 mL/min — ABNORMAL LOW (ref >90)
GFR calc non Af Amer: 38 mL/min — ABNORMAL LOW (ref >90)
Glucose, Bld: 100 mg/dL — ABNORMAL HIGH (ref 70–99)
Potassium: 4.6 mEq/L (ref 3.5–5.1)
Sodium: 138 mEq/L (ref 135–145)

## 2013-08-09 MED ORDER — FUROSEMIDE 10 MG/ML IJ SOLN
40.0000 mg | Freq: Once | INTRAMUSCULAR | Status: AC
  Administered 2013-08-09: 40 mg via INTRAVENOUS
  Filled 2013-08-09: qty 4

## 2013-08-09 MED ORDER — ALUM & MAG HYDROXIDE-SIMETH 200-200-20 MG/5ML PO SUSP
15.0000 mL | Freq: Four times a day (QID) | ORAL | Status: DC | PRN
  Administered 2013-08-09: 22:00:00 15 mL via ORAL
  Filled 2013-08-09: qty 30

## 2013-08-09 NOTE — Progress Notes (Signed)
[facility] PROGRESS NOTE Interim History: 79 y.o. female with hx of severe CAD, s/p NSTEMI one month ago, culminated in a heart catheterization showing triple vessel disease, presents to the ER with DOE, orthopnea, and found to be in CHF. After her cath last month, discussion concerning CABG ensued, but family didn't want it. She was subsequently had stent placed, and has been compliant with her meds. She also has COPD, and has home oxygen to use PRN. Evaluation in the ER included a BNP of 2400, Cr of 1.2, normal WBC with Hb of 11.6 g/DL, and CXR showed vascular congestion with patchy infiltrate and a small left pleural effusion. Her EKG showed SR with no acute ST-T changes  Filed Weights   08/07/13 0651 08/08/13 0516 08/09/13 0436  Weight: 69.037 kg (152 lb 3.2 oz) 68.7 kg (151 lb 7.3 oz) 68.176 kg (150 lb 4.8 oz)        Recent Labs  07/07/13 2038 08/07/13 0340 08/09/13 0558  PROBNP 1731.0* 2804.0* 929.9*     Assessment/Plan: Acute respiratory failure /Acute systolic heart failure  - Weight cont to improve. Estimated dry weight 65.5 kg.  - on oral lasix, metoprolol and imdur. Will need ACE as an outpatient.  - b-me in am, replete K.  - due to none complaint with diet. - home in am.  COPD (chronic obstructive pulmonary disease):  - stable.   Coronary artery disease  - cont ASA and plavix   Code Status: full  Family Communication: none  Disposition Plan: inpatinet  Consultants:  none  Procedures: ECHO:none  Antibiotics:  none (indicate start date, and stop date if known)  HPI/Subjective: No complains  Objective: Filed Vitals:   08/08/13 2055 08/09/13 0436 08/09/13 0941 08/09/13 1158  BP: 100/54 131/68 108/62 102/47  Pulse: 97 99 83   Temp: 98.3 F (36.8 C) 97.9 F (36.6 C)    TempSrc: Oral Oral    Resp: 18 18    Height:      Weight:  68.176 kg (150 lb 4.8 oz)    SpO2: 97% 99% 91%      Exam:  General: Alert, awake, oriented x3, in no acute  distress.  HEENT: No bruits, no goiter. No JVD Heart: Regular rate and rhythm, without murmurs, rubs, gallops.  Lungs: Good air movement, bilateral air movement.    Data Reviewed: Basic Metabolic Panel:  Recent Labs Lab 08/07/13 0340 08/08/13 0520 08/09/13 1000  NA 144 139 138  K 3.5 3.3* 4.6  CL 100 98 102  CO2 32 29 25  GLUCOSE 153* 117* 100*  BUN 26* 22 20  CREATININE 1.23* 1.23* 1.26*  CALCIUM 9.5 9.8 10.1   Liver Function Tests:  Recent Labs Lab 08/07/13 0340  AST 16  ALT 10  ALKPHOS 88  BILITOT 0.4  PROT 6.5  ALBUMIN 3.6   No results found for this basename: LIPASE, AMYLASE,  in the last 168 hours No results found for this basename: AMMONIA,  in the last 168 hours CBC:  Recent Labs Lab 08/07/13 0340  WBC 8.4  NEUTROABS 7.0  HGB 11.6*  HCT 35.2*  MCV 86.3  PLT 192   Cardiac Enzymes:  Recent Labs Lab 08/07/13 0340 08/07/13 0652 08/07/13 1357 08/07/13 1834  TROPONINI <0.30 <0.30 <0.30 <0.30   BNP (last 3 results)  Recent Labs  07/07/13 2038 08/07/13 0340 08/09/13 0558  PROBNP 1731.0* 2804.0* 929.9*   CBG: No results found for this basename: GLUCAP,  in the last 168 hours  No results found for this or any previous visit (from the past 240 hour(s)).   Studies: No results found.  Scheduled Meds: . albuterol  2.5 mg Nebulization TID  . aspirin EC  81 mg Oral Daily  . cholecalciferol  1,000 Units Oral Daily  . clopidogrel  75 mg Oral Q breakfast  . famotidine  20 mg Oral Daily  . ferrous sulfate  325 mg Oral Q breakfast  . furosemide  40 mg Oral Daily  . heparin  5,000 Units Subcutaneous Q8H  . isosorbide mononitrate  60 mg Oral Daily  . metoprolol tartrate  25 mg Oral BID  . mometasone-formoterol  2 puff Inhalation BID  . nitroGLYCERIN  1 inch Topical Q6H  . pantoprazole  40 mg Oral Daily  . simvastatin  10 mg Oral q1800  . sodium chloride  3 mL Intravenous Q12H  . tiotropium  18 mcg Inhalation Daily   Continuous Infusions:     Marinda Elk  [facility] Pager [phone]. If 8PM-8AM, please contact night-coverage at [email or web address], password [facility] 08/09/2013, 12:09 PM  LOS: 2 days

## 2013-08-09 NOTE — Progress Notes (Signed)
Pt a/o HOH, no c/o pain, pt oob to bathroom, tolerates diet, VSS, will continue to monitor

## 2013-08-09 NOTE — Progress Notes (Signed)
Physical Therapy Treatment Patient Details Name: [name] MRN: [id number] DOB: 10-03-1926 Today's Date: 08/09/2013 Time: 1308-6578 PT Time Calculation (min): 24 min  PT Assessment / Plan / Recommendation  History of Present Illness 79 y.o. female with hx of severe CAD, s/p NSTEMI one month ago, culminated in a heart catheterization showing triple vessel disease, presents to the ER with DOE, orthopnea, and found to be in CHF. After her cath last month, discussion concerning CABG ensued, but family didn't want it. She was subsequently had stent placed, and has been compliant with her meds. She also has COPD, and has home oxygen to use PRN.   PT Comments   Pt able to increase ambulation distance this session and able to perform HEP.  Pt with increase LOB with ambulation this session however pt c/o bilateral LE pain.     Follow Up Recommendations  No PT follow up;Supervision - Intermittent     Equipment Recommendations  None recommended by PT    Frequency Min 3X/week   Progress towards PT Goals Progress towards PT goals: Progressing toward goals  Plan Current plan remains appropriate    Precautions / Restrictions     Pertinent Vitals/Pain C/o bilateral LE pain with ambulation    Mobility  Bed Mobility Bed Mobility: Supine to Sit;Sitting - Scoot to Edge of Bed Supine to Sit: 6: Modified independent (Device/Increase time) Transfers Transfers: Sit to Stand;Stand to Sit Sit to Stand: 5: Supervision;From bed;From toilet Stand to Sit: 5: Supervision;To chair/3-in-1;To toilet Details for Transfer Assistance: Supervision for safety.  Cues for hand placement Ambulation/Gait Ambulation/Gait Assistance: 4: Min guard;5: Supervision Ambulation Distance (Feet): 200 Feet Assistive device: Rolling walker Ambulation/Gait Assistance Details: Cues for RW placement and body position within RW.  Pt tends to ambulate too far from RW and needs cues to navigate around obstacles.  Gait Pattern:  Step-through pattern Stairs: No    Exercises General Exercises - Lower Extremity Gluteal Sets: Strengthening;10 reps;Standing Long Arc Quad: Strengthening;Both;10 reps;Seated Hip ABduction/ADduction: Strengthening;10 reps Hip Flexion/Marching: Strengthening;Both;10 reps;Seated   PT Diagnosis:    PT Problem List:   PT Treatment Interventions:     PT Goals (current goals can now be found in the care plan section) Acute Rehab PT Goals Patient Stated Goal: To go home  PT Goal Formulation: With patient Time For Goal Achievement: 08/15/13 Potential to Achieve Goals: Good  Visit Information  Last PT Received On: 08/09/13 Assistance Needed: +1 History of Present Illness: 79 y.o. female with hx of severe CAD, s/p NSTEMI one month ago, culminated in a heart catheterization showing triple vessel disease, presents to the ER with DOE, orthopnea, and found to be in CHF. After her cath last month, discussion concerning CABG ensued, but family didn't want it. She was subsequently had stent placed, and has been compliant with her meds. She also has COPD, and has home oxygen to use PRN.    Subjective Data  Subjective: "Sure I'll work with you." Patient Stated Goal: To go home    Cognition  Cognition Arousal/Alertness: Awake/alert Behavior During Therapy: WFL for tasks assessed/performed Overall Cognitive Status: Within Functional Limits for tasks assessed    Balance  Static Standing Balance Static Standing - Balance Support: During functional activity Static Standing - Level of Assistance: 5: Stand by assistance Static Standing - Comment/# of Minutes: ~ 1 minute while performing glut squeeze in standing with LOB x 1 but able to self correct.   End of Session     GP  Dua Mehler 08/09/2013, 5:20 PM  Jake Shark, PT DPT [phone]

## 2013-08-09 NOTE — Discharge Summary (Signed)
Physician Discharge Summary  [name] RUE:[id number] DOB: 1926/04/14 DOA: 08/07/2013  PCP: [name], MD  Admit date: 08/07/2013 Discharge date: 08/10/2013  Time spent: 35 minutes  Recommendations for Outpatient Follow-up:  1. Follow up with cardiologist next week. 2. b-met next week.  BNP 929  Estimated dry weight 65 kg   Discharge Diagnoses:  Active Problems:   Acute respiratory failure   Acute systolic heart failure   Coronary artery disease   COPD (chronic obstructive pulmonary disease)   Discharge Condition: stable  Diet recommendation: low sodium diet  Filed Weights   08/08/13 0516 08/09/13 0436 08/10/13 0504  Weight: 68.7 kg (151 lb 7.3 oz) 68.176 kg (150 lb 4.8 oz) 67.1 kg (147 lb 14.9 oz)    History of present illness:  79 y.o. female with hx of severe CAD, s/p NSTEMI one month ago, culminated in a heart catheterization showing triple vessel disease, presents to the ER with DOE, orthopnea, and found to be in CHF. After her cath last month, discussion concerning CABG ensued, but family didn't want it. She was subsequently had stent placed, and has been compliant with her meds. She also has COPD, and has home oxygen to use PRN. Evaluation in the ER included a BNP of 2400, Cr of 1.2, normal WBC with Hb of 11.6 g/DL, and CXR showed vascular congestion with patchy infiltrate and a small left pleural effusion. Her EKG showed SR with no acute ST-T changes. She was given IV Lasix and had significant improvement, but still has some desat (85% RA), along with persistent bibasilar rales.   Hospital Course:  Acute respiratory failure /Acute systolic heart failure  - due to none complaint with diet.  - started on IV lasix, Weight cont to improve. Estimated dry weight 65.5 kg.  - Change to oral lasix, metoprolol and imdur. Will need ACE as an outpatient.  - b-me in 1 week.  COPD (chronic obstructive pulmonary disease):  - stable.   Coronary artery disease  -  cont ASA and plavix   Procedures:  CXR  Consultations:  none  Discharge Exam: Filed Vitals:   08/10/13 0504  BP: 102/54  Pulse: 71  Temp: 98.4 F (36.9 C)  Resp: 20    General: A&O x3 Cardiovascular: RRR Respiratory: good air movement CTA B/L  Discharge Instructions      Discharge Orders   Future Appointments Provider Department Dept Phone   08/14/2013 1:15 PM Everette Rank, MD [facility] [phone]   Future Orders Complete By Expires   Diet - low sodium heart healthy  As directed    Diet - low sodium heart healthy  As directed    Increase activity slowly  As directed    Increase activity slowly  As directed        Medication List         acetaminophen 325 MG tablet  Commonly known as:  TYLENOL  Take 650 mg by mouth every 6 (six) hours as needed for pain.     albuterol (5 MG/ML) 0.5% nebulizer solution  Commonly known as:  PROVENTIL  Take 0.5 mLs (2.5 mg total) by nebulization 3 (three) times daily.     albuterol 108 (90 BASE) MCG/ACT inhaler  Commonly known as:  PROVENTIL HFA;VENTOLIN HFA  Inhale 2 puffs into the lungs every 6 (six) hours as needed for wheezing.     aspirin EC 81 MG tablet  Take 81 mg by mouth daily.     clopidogrel 75 MG  tablet  Commonly known as:  PLAVIX  Take 75 mg by mouth daily.     ferrous sulfate 325 (65 FE) MG tablet  Take 325 mg by mouth daily with breakfast.     Fluticasone-Salmeterol 100-50 MCG/DOSE Aepb  Commonly known as:  ADVAIR  Inhale 1 puff into the lungs every 12 (twelve) hours.     furosemide 40 MG tablet  Commonly known as:  LASIX  Take 1 tablet (40 mg total) by mouth daily.     isosorbide mononitrate 30 MG 24 hr tablet  Commonly known as:  IMDUR  Take 2 tablets (60 mg total) by mouth daily.     metoprolol tartrate 25 MG tablet  Commonly known as:  LOPRESSOR  Take 25 mg by mouth 2 (two) times daily.     nitroGLYCERIN 0.4 MG SL tablet  Commonly known as:  NITROSTAT  Place 0.4 mg  under the tongue every 5 (five) minutes as needed.     pantoprazole 40 MG tablet  Commonly known as:  PROTONIX  Take 40 mg by mouth daily.     potassium chloride 20 MEQ packet  Commonly known as:  KLOR-CON  Take 20 mEq by mouth 2 (two) times daily.     pravastatin 20 MG tablet  Commonly known as:  PRAVACHOL  Take 20 mg by mouth daily.     ranitidine 150 MG tablet  Commonly known as:  ZANTAC  Take 150 mg by mouth 2 (two) times daily.     tiotropium 18 MCG inhalation capsule  Commonly known as:  SPIRIVA  Place 18 mcg into inhaler and inhale daily.     Vitamin D 1000 UNITS capsule  Take 1,000 Units by mouth daily.       Allergies  Allergen Reactions  . Sulfa Antibiotics Other (See Comments)    unknown   Follow-up Information   Follow up with Corky Crafts., MD In 1 week. (hospital follow up)    Specialty:  Cardiology   Contact information:   [address] [place] [address] [phone]        The results of significant diagnostics from this hospitalization (including imaging, microbiology, ancillary and laboratory) are listed below for reference.    Significant Diagnostic Studies: Dg Chest 2 View  08/07/2013   CLINICAL DATA:  Shortness of breast.  EXAM: CHEST  2 VIEW  COMPARISON:  07/07/2013  FINDINGS: Heart is borderline in size. The the patchy perihilar and lower lobe airspace opacities. Cannot exclude edema. Small left pleural effusion. No acute bony abnormality.  IMPRESSION: Cardiomegaly with vascular congestion. Patchy bilateral perihilar and lower lobe opacities could represent edema and/or atelectasis. Small left effusion.   Electronically Signed   By: Charlett Nose M.D.   On: 08/07/2013 04:24    Microbiology: No results found for this or any previous visit (from the past 240 hour(s)).   Labs: Basic Metabolic Panel:  Recent Labs Lab 08/07/13 0340 08/08/13 0520 08/09/13 1000 08/10/13 0425  NA 144 139 138 139  K 3.5 3.3* 4.6  3.7  CL 100 98 102 100  CO2 32 29 25 27   GLUCOSE 153* 117* 100* 110*  BUN 26* 22 20 19   CREATININE 1.23* 1.23* 1.26* 1.14*  CALCIUM 9.5 9.8 10.1 10.2   Liver Function Tests:  Recent Labs Lab 08/07/13 0340  AST 16  ALT 10  ALKPHOS 88  BILITOT 0.4  PROT 6.5  ALBUMIN 3.6   No results found for this basename: LIPASE, AMYLASE,  in the last 168 hours No results found for this basename: AMMONIA,  in the last 168 hours CBC:  Recent Labs Lab 08/07/13 0340  WBC 8.4  NEUTROABS 7.0  HGB 11.6*  HCT 35.2*  MCV 86.3  PLT 192   Cardiac Enzymes:  Recent Labs Lab 08/07/13 0340 08/07/13 0652 08/07/13 1357 08/07/13 1834  TROPONINI <0.30 <0.30 <0.30 <0.30   BNP: BNP (last 3 results)  Recent Labs  07/07/13 2038 08/07/13 0340 08/09/13 0558  PROBNP 1731.0* 2804.0* 929.9*   CBG: No results found for this basename: GLUCAP,  in the last 168 hours    Signed:  Marinda Elk  [facility] 08/10/2013, 10:23 AM

## 2013-08-10 LAB — BASIC METABOLIC PANEL
Calcium: 10.2 mg/dL (ref 8.4–10.5)
Chloride: 100 mEq/L (ref 96–112)
GFR calc Af Amer: 49 mL/min — ABNORMAL LOW (ref >90)
GFR calc non Af Amer: 43 mL/min — ABNORMAL LOW (ref >90)
Potassium: 3.7 mEq/L (ref 3.5–5.1)
Sodium: 139 mEq/L (ref 135–145)

## 2013-08-10 MED ORDER — PANTOPRAZOLE SODIUM 40 MG PO TBEC: 40.0000 mg | DELAYED_RELEASE_TABLET | Freq: Every day | ORAL | Status: DC

## 2013-08-10 NOTE — Progress Notes (Signed)
The patient was very pleasant throughout the night.  Her only complaint was of indigestion at 2130 last night.  She stated that she received complete relief shortly after taking 15 mL of Maalox/Mylanta.

## 2013-08-12 NOTE — Progress Notes (Signed)
CSW met with patient and her husband this afternoon to complete an Proofreader and HCPOA.  Patient had already read the document and initialed her wishes;  CSW reviewed the document and provided witnesses and notary for patient.  Copy of completed document placed in patient's chart; original and 3 copies given to patient to take home. Both patient and her husband were very pleased with this assistance. No further needs identified. CSW signing off.  Patient will return home with husband at d/c.  RNCM is aware.  [name]. [place]  [phone]

## 2013-08-14 ENCOUNTER — Ambulatory Visit (INDEPENDENT_AMBULATORY_CARE_PROVIDER_SITE_OTHER): Payer: Medicare HMO | Admitting: Interventional Cardiology

## 2013-08-14 ENCOUNTER — Encounter: Payer: Self-pay | Admitting: Interventional Cardiology

## 2013-08-14 VITALS — BP 124/62 | HR 63 | Ht 61.0 in | Wt 150.0 lb

## 2013-08-14 DIAGNOSIS — R0609 Other forms of dyspnea: Secondary | ICD-10-CM

## 2013-08-14 DIAGNOSIS — I2510 Atherosclerotic heart disease of native coronary artery without angina pectoris: Secondary | ICD-10-CM

## 2013-08-14 DIAGNOSIS — I509 Heart failure, unspecified: Secondary | ICD-10-CM

## 2013-08-14 MED ORDER — FUROSEMIDE 40 MG PO TABS: 40.0000 mg | ORAL_TABLET | Freq: Two times a day (BID) | ORAL | Status: DC

## 2013-08-14 NOTE — Progress Notes (Signed)
Patient ID: [name], female   DOB: 04/25/1926, 79 y.o.   MRN: [id number]    [address] [place], [place]  [address] Phone: [phone] Fax:  [phone]  Date:  08/14/2013   ID:  [name], DOB [birth year], MRN [id number]  PCP:  [name], MD      History of Present Illness: [name] is a 79 y.o. female with CAD and combined systolic and diastolic heart failure. He had a stent to an obtuse marginal several weeks ago. He had been doing well. She ended up in the hospital due to fluid overload several days ago. She had been eating out at restaurants a fair bit and has not been keeping track of her salt intake. Her daughter accompanies her today and they're working on things at home to try to help her reduce her salt intake.   Wt Readings from Last 3 Encounters:  08/14/13 150 lb (68.04 kg)  08/10/13 147 lb 14.9 oz (67.1 kg)  07/11/13 152 lb 1.9 oz (69 kg)     Past Medical History  Diagnosis Date  . DVT (deep venous thrombosis)   . Acid reflux   . Hypertension   . Hypercholesterolemia   . Anginal pain   . Asthma   . Hyponatremia   . Congestive heart failure   . PONV (postoperative nausea and vomiting)   . Myocardial infarction 2014; 07/03/2013  . COPD (chronic obstructive pulmonary disease)   . Pneumonia     "as a child and again in 2013" (07/04/2013)  . Chronic bronchitis     "once or twice/yr" (07/04/2013)  . Exertional shortness of breath   . Pernicious anemia   . History of blood transfusion     "cause my blood was low" (07/04/2013)  . QIHKVQQV(956.3)     "probably weekly" (07/04/2013)  . Acoustic neuroma     "Dr. Suzanna Obey is testing for this in her right ear right now" (07/04/2013)  . Deafness in right ear   . Arthritis     "left knee; back" (07/04/2013)  . Lumbar compression fracture 2010; 2012    "fell; fell" (07/04/2013)  . Anxiety   . Chronic cystitis     Current Outpatient Prescriptions  Medication Sig Dispense  Refill  . acetaminophen (TYLENOL) 325 MG tablet Take 650 mg by mouth every 6 (six) hours as needed for pain.      Marland Kitchen albuterol (PROVENTIL HFA;VENTOLIN HFA) 108 (90 BASE) MCG/ACT inhaler Inhale 2 puffs into the lungs every 6 (six) hours as needed for wheezing.      Marland Kitchen aspirin EC 81 MG tablet Take 81 mg by mouth daily.      . Cholecalciferol (VITAMIN D) 1000 UNITS capsule Take 1,000 Units by mouth daily.      . clopidogrel (PLAVIX) 75 MG tablet Take 75 mg by mouth daily.      . Fluticasone-Salmeterol (ADVAIR) 100-50 MCG/DOSE AEPB Inhale 1 puff into the lungs every 12 (twelve) hours.      . furosemide (LASIX) 40 MG tablet Take 1 tablet (40 mg total) by mouth daily.  30 tablet    . isosorbide mononitrate (IMDUR) 30 MG 24 hr tablet Take 2 tablets (60 mg total) by mouth daily.  30 tablet  11  . metoprolol tartrate (LOPRESSOR) 25 MG tablet Take 25 mg by mouth 2 (two) times daily.      . nitroGLYCERIN (NITROSTAT) 0.4 MG SL tablet Place 0.4 mg under the tongue every  5 (five) minutes as needed.        . pantoprazole (PROTONIX) 40 MG tablet Take 40 mg by mouth daily.      . potassium chloride (KLOR-CON) 20 MEQ packet Take 20 mEq by mouth 2 (two) times daily.      . pravastatin (PRAVACHOL) 20 MG tablet Take 20 mg by mouth daily.      Marland Kitchen tiotropium (SPIRIVA) 18 MCG inhalation capsule Place 18 mcg into inhaler and inhale daily.       No current facility-administered medications for this visit.    Allergies:    Allergies  Allergen Reactions  . Sulfa Antibiotics Other (See Comments)    unknown    Social History:  The patient  reports that she has never smoked. She has never used smokeless tobacco. She reports that she does not drink alcohol or use illicit drugs.   Family History:  The patient's family history includes Diabetes in her mother and mother; Emphysema in her brother and father; Heart failure in her mother.   ROS:  Please see the history of present illness.  No nausea, vomiting.  No fevers,  chills.  No focal weakness.  No dysuria. Edema better.   All other systems reviewed and negative.   PHYSICAL EXAM: VS:  BP 124/62  Pulse 63  Ht 5\' 1"  (1.549 m)  Wt 150 lb (68.04 kg)  BMI 28.36 kg/m2 Well nourished, well developed, in no acute distress HEENT: normal Neck: no JVD, no carotid bruits Cardiac:  normal S1, S2; RRR; 2/6 systolic murmur Lungs:  clear to auscultation bilaterally, no wheezing, rhonchi or rales Abd: soft, nontender, no hepatomegaly Ext: no edema Skin: warm and dry Neuro:   no focal abnormalities noted      ASSESSMENT AND PLAN:  1. CAD: No angina.  Ruled out during last hospital stay.   2. Systolic /diastolic heart failure: Increase Lasix to 40 mg BID.   3. Low salt diet stressed.  Discussed CHF clinic.  She will be having some home health help at home.  If Home health does not help prevent CHF, would reconsider referral to CHF clinic.    Signed, Fredric Mare, MD, [facility] 08/14/2013 1:49 PM

## 2013-08-14 NOTE — Patient Instructions (Signed)
Your physician recommends that you return for lab work in: 1 week in on October 15th.  Your physician recommends that you follow up as scheduled.  Increase lasix to 40mg  twice a day and prescription was sent into pharmacy.

## 2013-08-16 ENCOUNTER — Other Ambulatory Visit: Payer: Self-pay | Admitting: *Deleted

## 2013-08-16 MED ORDER — FUROSEMIDE 40 MG PO TABS: 40.0000 mg | ORAL_TABLET | Freq: Two times a day (BID) | ORAL | Status: DC

## 2013-08-21 ENCOUNTER — Other Ambulatory Visit (INDEPENDENT_AMBULATORY_CARE_PROVIDER_SITE_OTHER): Payer: Medicare HMO

## 2013-08-21 DIAGNOSIS — I509 Heart failure, unspecified: Secondary | ICD-10-CM

## 2013-08-21 LAB — BASIC METABOLIC PANEL
CO2: 30 mEq/L (ref 19–32)
Chloride: 99 mEq/L (ref 96–112)
Creatinine, Ser: 1.5 mg/dL — ABNORMAL HIGH (ref 0.4–1.2)
GFR: 35.88 mL/min — ABNORMAL LOW (ref >60.00)
Potassium: 4.7 mEq/L (ref 3.5–5.1)
Sodium: 137 mEq/L (ref 135–145)

## 2013-08-22 ENCOUNTER — Telehealth: Payer: Self-pay | Admitting: Cardiology

## 2013-08-22 MED ORDER — FUROSEMIDE 40 MG PO TABS: ORAL_TABLET | ORAL | Status: DC

## 2013-08-22 NOTE — Telephone Encounter (Signed)
Message copied by Theda Sers on Thu Aug 22, 2013  8:55 AM ------      Message from: Corky Crafts      Created: Wed Aug 21, 2013  4:11 PM       Cr back to where she was when she came to hospital.  Can decrease lasix to 40 mg BID 5 days a week.  Lasix 40 mg daily 2 days a week. ------

## 2013-08-22 NOTE — Telephone Encounter (Signed)
Pt notified. Meds updated.  

## 2013-08-23 ENCOUNTER — Telehealth: Payer: Self-pay | Admitting: Interventional Cardiology

## 2013-08-23 MED ORDER — METOPROLOL TARTRATE 25 MG PO TABS: ORAL_TABLET | ORAL | Status: DC

## 2013-08-23 NOTE — Telephone Encounter (Signed)
New message    At pt home--b/p is 88/56 rt arm, 98/58 lft arm.  C/o dizziness some times.  Pulse rate is 56-58.   Could it be the metoprolol.  If we change anything--pls let pt know and this lady know

## 2013-08-23 NOTE — Telephone Encounter (Signed)
Can reduce metoprolol to 12.5 mg BID

## 2013-08-23 NOTE — Telephone Encounter (Signed)
To Dr. Varanasi, please advise.  

## 2013-08-23 NOTE — Telephone Encounter (Signed)
Pt and pts daughter notified. Meds updated.

## 2013-08-28 ENCOUNTER — Telehealth: Payer: Self-pay | Admitting: Interventional Cardiology

## 2013-08-28 NOTE — Telephone Encounter (Signed)
New problem    Since her metoprolol was decreased her bp is  100/60  Pulse 64, heard crackling in both lower lobes in back area. Weight is stable.  Please advise is any changes.

## 2013-08-28 NOTE — Telephone Encounter (Signed)
Raynelle Fanning from [place] case manager called to let Dr. Nechama Guard know that after decreasing pt's Metoprolol dose pt's BP is 100/60 pulse 64 beats/minute. Raynelle Fanning states when listening to pt's lungs, she heard some chrankles in  Pt's lung bases. Raynelle Fanning states that she may had missed to heard them the first time she auscultated pt's lungs. According to julie pt denies SOB, no weight gain nor edema in LE.

## 2013-08-29 NOTE — Telephone Encounter (Signed)
To Irving Shows as an Lorain Childes.

## 2013-08-29 NOTE — Telephone Encounter (Signed)
Continue current meds 

## 2013-09-30 ENCOUNTER — Other Ambulatory Visit: Payer: Self-pay | Admitting: Interventional Cardiology

## 2013-10-08 ENCOUNTER — Other Ambulatory Visit: Payer: Self-pay | Admitting: Interventional Cardiology

## 2013-10-19 ENCOUNTER — Other Ambulatory Visit: Payer: Self-pay | Admitting: Interventional Cardiology

## 2013-10-21 ENCOUNTER — Other Ambulatory Visit: Payer: Self-pay | Admitting: *Deleted

## 2013-10-21 MED ORDER — FUROSEMIDE 40 MG PO TABS: ORAL_TABLET | ORAL | Status: DC

## 2013-11-10 ENCOUNTER — Inpatient Hospital Stay (HOSPITAL_COMMUNITY)
Admission: EM | Admit: 2013-11-10 | Discharge: 2013-11-13 | DRG: 280 | Disposition: A | Discharge status: Home / Self Care | Payer: Medicare HMO | Attending: Internal Medicine | Admitting: Internal Medicine

## 2013-11-10 ENCOUNTER — Emergency Department (HOSPITAL_COMMUNITY): Payer: Medicare HMO

## 2013-11-10 ENCOUNTER — Encounter (HOSPITAL_COMMUNITY): Payer: Self-pay | Admitting: Emergency Medicine

## 2013-11-10 DIAGNOSIS — N179 Acute kidney failure, unspecified: Secondary | ICD-10-CM | POA: Diagnosis present

## 2013-11-10 DIAGNOSIS — J9610 Chronic respiratory failure, unspecified whether with hypoxia or hypercapnia: Secondary | ICD-10-CM | POA: Diagnosis present

## 2013-11-10 DIAGNOSIS — Z86718 Personal history of other venous thrombosis and embolism: Secondary | ICD-10-CM

## 2013-11-10 DIAGNOSIS — Z9981 Dependence on supplemental oxygen: Secondary | ICD-10-CM

## 2013-11-10 DIAGNOSIS — J45901 Unspecified asthma with (acute) exacerbation: Secondary | ICD-10-CM

## 2013-11-10 DIAGNOSIS — J441 Chronic obstructive pulmonary disease with (acute) exacerbation: Secondary | ICD-10-CM | POA: Diagnosis present

## 2013-11-10 DIAGNOSIS — J45909 Unspecified asthma, uncomplicated: Secondary | ICD-10-CM

## 2013-11-10 DIAGNOSIS — Z9861 Coronary angioplasty status: Secondary | ICD-10-CM

## 2013-11-10 DIAGNOSIS — I129 Hypertensive chronic kidney disease with stage 1 through stage 4 chronic kidney disease, or unspecified chronic kidney disease: Secondary | ICD-10-CM | POA: Diagnosis present

## 2013-11-10 DIAGNOSIS — E871 Hypo-osmolality and hyponatremia: Secondary | ICD-10-CM

## 2013-11-10 DIAGNOSIS — I509 Heart failure, unspecified: Secondary | ICD-10-CM | POA: Diagnosis present

## 2013-11-10 DIAGNOSIS — E7800 Pure hypercholesterolemia, unspecified: Secondary | ICD-10-CM | POA: Diagnosis present

## 2013-11-10 DIAGNOSIS — I5043 Acute on chronic combined systolic (congestive) and diastolic (congestive) heart failure: Secondary | ICD-10-CM | POA: Diagnosis present

## 2013-11-10 DIAGNOSIS — I214 Non-ST elevation (NSTEMI) myocardial infarction: Secondary | ICD-10-CM

## 2013-11-10 DIAGNOSIS — I10 Essential (primary) hypertension: Secondary | ICD-10-CM

## 2013-11-10 DIAGNOSIS — J9600 Acute respiratory failure, unspecified whether with hypoxia or hypercapnia: Secondary | ICD-10-CM

## 2013-11-10 DIAGNOSIS — J449 Chronic obstructive pulmonary disease, unspecified: Secondary | ICD-10-CM | POA: Diagnosis present

## 2013-11-10 DIAGNOSIS — I2510 Atherosclerotic heart disease of native coronary artery without angina pectoris: Secondary | ICD-10-CM | POA: Diagnosis present

## 2013-11-10 DIAGNOSIS — F411 Generalized anxiety disorder: Secondary | ICD-10-CM | POA: Diagnosis present

## 2013-11-10 DIAGNOSIS — Z7982 Long term (current) use of aspirin: Secondary | ICD-10-CM

## 2013-11-10 DIAGNOSIS — N189 Chronic kidney disease, unspecified: Secondary | ICD-10-CM | POA: Diagnosis present

## 2013-11-10 DIAGNOSIS — R0609 Other forms of dyspnea: Secondary | ICD-10-CM

## 2013-11-10 DIAGNOSIS — I5021 Acute systolic (congestive) heart failure: Secondary | ICD-10-CM

## 2013-11-10 DIAGNOSIS — I5041 Acute combined systolic (congestive) and diastolic (congestive) heart failure: Secondary | ICD-10-CM

## 2013-11-10 DIAGNOSIS — K219 Gastro-esophageal reflux disease without esophagitis: Secondary | ICD-10-CM | POA: Diagnosis present

## 2013-11-10 DIAGNOSIS — I739 Peripheral vascular disease, unspecified: Secondary | ICD-10-CM | POA: Diagnosis present

## 2013-11-10 DIAGNOSIS — E876 Hypokalemia: Secondary | ICD-10-CM | POA: Diagnosis present

## 2013-11-10 LAB — CBC WITH DIFFERENTIAL/PLATELET
BASOS ABS: 0 10*3/uL (ref 0.0–0.1)
BASOS PCT: 0 % (ref 0–1)
Eosinophils Absolute: 0 10*3/uL (ref 0.0–0.7)
Eosinophils Relative: 1 % (ref 0–5)
HEMATOCRIT: 35.7 % — AB (ref 36.0–46.0)
Hemoglobin: 11.7 g/dL — ABNORMAL LOW (ref 12.0–15.0)
Lymphocytes Relative: 7 % — ABNORMAL LOW (ref 12–46)
Lymphs Abs: 0.5 10*3/uL — ABNORMAL LOW (ref 0.7–4.0)
MCH: 27.8 pg (ref 26.0–34.0)
MCHC: 32.8 g/dL (ref 30.0–36.0)
MCV: 84.8 fL (ref 78.0–100.0)
MONO ABS: 0.5 10*3/uL (ref 0.1–1.0)
Monocytes Relative: 7 % (ref 3–12)
NEUTROS PCT: 86 % — AB (ref 43–77)
Neutro Abs: 6.2 10*3/uL (ref 1.7–7.7)
Platelets: 193 10*3/uL (ref 150–400)
RBC: 4.21 MIL/uL (ref 3.87–5.11)
RDW: 14.3 % (ref 11.5–15.5)
WBC: 7.3 10*3/uL (ref 4.0–10.5)

## 2013-11-10 LAB — COMPREHENSIVE METABOLIC PANEL
ALBUMIN: 3.6 g/dL (ref 3.5–5.2)
ALT: 12 U/L (ref 0–35)
AST: 23 U/L (ref 0–37)
Alkaline Phosphatase: 92 U/L (ref 39–117)
BILIRUBIN TOTAL: 0.3 mg/dL (ref 0.3–1.2)
BUN: 30 mg/dL — ABNORMAL HIGH (ref 6–23)
CHLORIDE: 97 meq/L (ref 96–112)
CO2: 36 mEq/L — ABNORMAL HIGH (ref 19–32)
CREATININE: 1.42 mg/dL — AB (ref 0.50–1.10)
Calcium: 9 mg/dL (ref 8.4–10.5)
GFR calc Af Amer: 38 mL/min — ABNORMAL LOW (ref >90)
GFR calc non Af Amer: 33 mL/min — ABNORMAL LOW (ref >90)
Glucose, Bld: 177 mg/dL — ABNORMAL HIGH (ref 70–99)
Potassium: 3.4 mEq/L — ABNORMAL LOW (ref 3.7–5.3)
Sodium: 148 mEq/L — ABNORMAL HIGH (ref 137–147)
TOTAL PROTEIN: 6.5 g/dL (ref 6.0–8.3)

## 2013-11-10 LAB — URINALYSIS, ROUTINE W REFLEX MICROSCOPIC
Bilirubin Urine: NEGATIVE
GLUCOSE, UA: NEGATIVE mg/dL
HGB URINE DIPSTICK: NEGATIVE
KETONES UR: NEGATIVE mg/dL
Leukocytes, UA: NEGATIVE
Nitrite: NEGATIVE
PROTEIN: NEGATIVE mg/dL
Specific Gravity, Urine: 1.012 (ref 1.005–1.030)
Urobilinogen, UA: 0.2 mg/dL (ref 0.0–1.0)
pH: 8.5 — ABNORMAL HIGH (ref 5.0–8.0)

## 2013-11-10 LAB — HEPARIN LEVEL (UNFRACTIONATED): Heparin Unfractionated: 0.34 IU/mL (ref 0.30–0.70)

## 2013-11-10 LAB — PRO B NATRIURETIC PEPTIDE: Pro B Natriuretic peptide (BNP): 1921 pg/mL — ABNORMAL HIGH (ref 0–450)

## 2013-11-10 LAB — MRSA PCR SCREENING: MRSA by PCR: NEGATIVE

## 2013-11-10 LAB — POCT I-STAT TROPONIN I: TROPONIN I, POC: 0.7 ng/mL — AB (ref 0.00–0.08)

## 2013-11-10 LAB — TROPONIN I: Troponin I: 2.21 ng/mL (ref <0.30)

## 2013-11-10 MED ORDER — ASPIRIN EC 81 MG PO TBEC
81.0000 mg | DELAYED_RELEASE_TABLET | Freq: Every day | ORAL | Status: DC
  Administered 2013-11-10 – 2013-11-13 (×4): 81 mg via ORAL
  Filled 2013-11-10 (×4): qty 1

## 2013-11-10 MED ORDER — ISOSORBIDE MONONITRATE ER 30 MG PO TB24
30.0000 mg | ORAL_TABLET | Freq: Every day | ORAL | Status: DC
  Administered 2013-11-10 – 2013-11-13 (×3): 30 mg via ORAL
  Filled 2013-11-10 (×4): qty 1

## 2013-11-10 MED ORDER — IPRATROPIUM BROMIDE 0.02 % IN SOLN
1.0000 mg | Freq: Once | RESPIRATORY_TRACT | Status: AC
  Administered 2013-11-10: 1 mg via RESPIRATORY_TRACT
  Filled 2013-11-10: qty 5

## 2013-11-10 MED ORDER — HEPARIN (PORCINE) IN NACL 100-0.45 UNIT/ML-% IJ SOLN
900.0000 [IU]/h | INTRAMUSCULAR | Status: DC
  Administered 2013-11-11 – 2013-11-12 (×2): 900 [IU]/h via INTRAVENOUS
  Filled 2013-11-10 (×3): qty 250

## 2013-11-10 MED ORDER — ASPIRIN 325 MG PO TABS
325.0000 mg | ORAL_TABLET | Freq: Once | ORAL | Status: AC
  Administered 2013-11-10: 325 mg via ORAL
  Filled 2013-11-10: qty 1

## 2013-11-10 MED ORDER — HYDRALAZINE HCL 25 MG PO TABS
25.0000 mg | ORAL_TABLET | Freq: Three times a day (TID) | ORAL | Status: DC
  Administered 2013-11-10 – 2013-11-12 (×6): 25 mg via ORAL
  Filled 2013-11-10 (×10): qty 1

## 2013-11-10 MED ORDER — PANTOPRAZOLE SODIUM 40 MG PO TBEC
40.0000 mg | DELAYED_RELEASE_TABLET | Freq: Every day | ORAL | Status: DC
  Administered 2013-11-10 – 2013-11-13 (×4): 40 mg via ORAL
  Filled 2013-11-10 (×4): qty 1

## 2013-11-10 MED ORDER — ONDANSETRON HCL 4 MG PO TABS: 4.0000 mg | ORAL_TABLET | Freq: Four times a day (QID) | ORAL | Status: DC | PRN

## 2013-11-10 MED ORDER — SODIUM CHLORIDE 0.9 % IJ SOLN
3.0000 mL | Freq: Two times a day (BID) | INTRAMUSCULAR | Status: DC
  Administered 2013-11-10 – 2013-11-13 (×5): 3 mL via INTRAVENOUS

## 2013-11-10 MED ORDER — POTASSIUM CHLORIDE CRYS ER 20 MEQ PO TBCR
40.0000 meq | EXTENDED_RELEASE_TABLET | Freq: Once | ORAL | Status: AC
  Administered 2013-11-10: 40 meq via ORAL
  Filled 2013-11-10: qty 2

## 2013-11-10 MED ORDER — ONDANSETRON HCL 4 MG/2ML IJ SOLN
4.0000 mg | Freq: Four times a day (QID) | INTRAMUSCULAR | Status: DC | PRN
  Administered 2013-11-12: 4 mg via INTRAVENOUS
  Filled 2013-11-10: qty 2

## 2013-11-10 MED ORDER — HEPARIN SODIUM (PORCINE) 5000 UNIT/ML IJ SOLN
5000.0000 [IU] | Freq: Three times a day (TID) | INTRAMUSCULAR | Status: DC
  Filled 2013-11-10 (×4): qty 1

## 2013-11-10 MED ORDER — FUROSEMIDE 10 MG/ML IJ SOLN: 40.0000 mg | Freq: Every day | INTRAMUSCULAR | Status: DC

## 2013-11-10 MED ORDER — ALBUTEROL SULFATE HFA 108 (90 BASE) MCG/ACT IN AERS
2.0000 | INHALATION_SPRAY | Freq: Four times a day (QID) | RESPIRATORY_TRACT | Status: DC | PRN
  Filled 2013-11-10: qty 6.7

## 2013-11-10 MED ORDER — CLOPIDOGREL BISULFATE 75 MG PO TABS
75.0000 mg | ORAL_TABLET | Freq: Every day | ORAL | Status: DC
  Administered 2013-11-10 – 2013-11-13 (×4): 75 mg via ORAL
  Filled 2013-11-10 (×5): qty 1

## 2013-11-10 MED ORDER — MOMETASONE FURO-FORMOTEROL FUM 100-5 MCG/ACT IN AERO
2.0000 | INHALATION_SPRAY | Freq: Two times a day (BID) | RESPIRATORY_TRACT | Status: DC
  Administered 2013-11-10 – 2013-11-13 (×7): 2 via RESPIRATORY_TRACT
  Filled 2013-11-10: qty 8.8

## 2013-11-10 MED ORDER — HEPARIN BOLUS VIA INFUSION
3000.0000 [IU] | Freq: Once | INTRAVENOUS | Status: AC
  Administered 2013-11-10: 3000 [IU] via INTRAVENOUS
  Filled 2013-11-10: qty 3000

## 2013-11-10 MED ORDER — FUROSEMIDE 10 MG/ML IJ SOLN
40.0000 mg | Freq: Two times a day (BID) | INTRAMUSCULAR | Status: DC
  Administered 2013-11-10 (×2): 40 mg via INTRAVENOUS
  Filled 2013-11-10 (×4): qty 4

## 2013-11-10 MED ORDER — HEPARIN (PORCINE) IN NACL 100-0.45 UNIT/ML-% IJ SOLN
900.0000 [IU]/h | INTRAMUSCULAR | Status: DC
Start: 2013-11-10 — End: 2013-11-10
  Administered 2013-11-10: 900 [IU]/h via INTRAVENOUS
  Filled 2013-11-10: qty 250

## 2013-11-10 MED ORDER — ALBUTEROL (5 MG/ML) CONTINUOUS INHALATION SOLN
15.0000 mg/h | INHALATION_SOLUTION | Freq: Once | RESPIRATORY_TRACT | Status: AC
  Administered 2013-11-10: 15 mg/h via RESPIRATORY_TRACT
  Filled 2013-11-10: qty 20

## 2013-11-10 MED ORDER — NITROGLYCERIN 0.4 MG SL SUBL: 0.4000 mg | SUBLINGUAL_TABLET | SUBLINGUAL | Status: DC | PRN

## 2013-11-10 MED ORDER — METOPROLOL TARTRATE 25 MG PO TABS
25.0000 mg | ORAL_TABLET | Freq: Two times a day (BID) | ORAL | Status: DC
  Administered 2013-11-10 – 2013-11-13 (×6): 25 mg via ORAL
  Filled 2013-11-10 (×8): qty 1

## 2013-11-10 MED ORDER — METHYLPREDNISOLONE SODIUM SUCC 125 MG IJ SOLR
125.0000 mg | Freq: Once | INTRAMUSCULAR | Status: AC
  Administered 2013-11-10: 125 mg via INTRAVENOUS
  Filled 2013-11-10: qty 2

## 2013-11-10 MED ORDER — TIOTROPIUM BROMIDE MONOHYDRATE 18 MCG IN CAPS
18.0000 ug | ORAL_CAPSULE | Freq: Every day | RESPIRATORY_TRACT | Status: DC
  Administered 2013-11-11 – 2013-11-13 (×3): 18 ug via RESPIRATORY_TRACT
  Filled 2013-11-10 (×2): qty 5

## 2013-11-10 MED ORDER — IPRATROPIUM-ALBUTEROL 0.5-2.5 (3) MG/3ML IN SOLN
3.0000 mL | Freq: Four times a day (QID) | RESPIRATORY_TRACT | Status: AC
  Administered 2013-11-10 (×3): 3 mL via RESPIRATORY_TRACT
  Filled 2013-11-10 (×4): qty 3

## 2013-11-10 MED ORDER — FUROSEMIDE 10 MG/ML IJ SOLN
40.0000 mg | Freq: Once | INTRAMUSCULAR | Status: AC
  Administered 2013-11-10: 40 mg via INTRAVENOUS
  Filled 2013-11-10: qty 4

## 2013-11-10 NOTE — Progress Notes (Signed)
[facility] PROGRESS NOTE  KHALIAH BARNICK HBZ:[id number] DOB: Oct 11, 1926 DOA: 11/10/2013 PCP: [name], MD  Assessment/Plan: 80 y/o female with PMH of COPD on home oxygen, HTN, CAD, CHF presented with SOB admitted with CHF,  NSTEMI  1. Acute on chronic CHF/pulmonary edema; EF 40-50% by echo 02/2013 and cath 06/2013 -cont diuresis per cardiology; cont aspirin and Plavix, hydralazine, Imdur, metoprolol; prn NiPPV -pend repeat echo   2. COPD, chronic respiratory failure on home oxygen; CXR: no clear infiltrate  -cont inhalers, oxygen;   3. NSTEMI started on IV heparin; cont aspirin and Plavix, hydralazine, Imdur, metoprolol -management per cardiology  4. CAD: LAD, RCA recent PCI of OM1 per cardiology family refused consideration of CABG -management cont per cardiology   5. HTN cont home regimen, monitor   Code Status: full Family Communication: d/w patient, and her daughter  (indicate person spoken with, relationship, and if by phone, the number) Disposition Plan: home pend clinical improvement    Consultants:  Cardiology   Procedures:  Pend echo  Antibiotics:  None  (indicate start date, and stop date if known)  HPI/Subjective: aletr   Objective: Filed Vitals:   11/10/13 0800  BP: 141/69  Pulse: 99  Temp: 98.4 F (36.9 C)  Resp: 26   No intake or output data in the 24 hours ending 11/10/13 0933 Filed Weights   11/10/13 1017  Weight: 76 kg (167 lb 8.8 oz)    Exam:   General:  alert  Cardiovascular: s1,s2 rrr  Respiratory: BL crackles   Abdomen: soft, nt, nd   Musculoskeletal: no edema    Data Reviewed: Basic Metabolic Panel:  Recent Labs Lab 11/10/13 0358  NA 148*  K 3.4*  CL 97  CO2 36*  GLUCOSE 177*  BUN 30*  CREATININE 1.42*  CALCIUM 9.0   Liver Function Tests:  Recent Labs Lab 11/10/13 0358  AST 23  ALT 12  ALKPHOS 92  BILITOT 0.3  PROT 6.5  ALBUMIN 3.6   No results found for this basename: LIPASE,  AMYLASE,  in the last 168 hours No results found for this basename: AMMONIA,  in the last 168 hours CBC:  Recent Labs Lab 11/10/13 0358  WBC 7.3  NEUTROABS 6.2  HGB 11.7*  HCT 35.7*  MCV 84.8  PLT 193   Cardiac Enzymes:  Recent Labs Lab 11/10/13 0526  TROPONINI 2.21*   BNP (last 3 results)  Recent Labs  08/07/13 0340 08/09/13 0558 11/10/13 0358  PROBNP 2804.0* 929.9* 1921.0*   CBG: No results found for this basename: GLUCAP,  in the last 168 hours  Recent Results (from the past 240 hour(s))  MRSA PCR SCREENING     Status: None   Collection Time    11/10/13  6:18 AM      Result Value Range Status   MRSA by PCR NEGATIVE  NEGATIVE Final   Comment:            The GeneXpert MRSA Assay (FDA     approved for NASAL specimens     only), is one component of a     comprehensive MRSA colonization     surveillance program. It is not     intended to diagnose MRSA     infection nor to guide or     monitor treatment for     MRSA infections.     Studies: Dg Chest Portable 1 View  11/10/2013   CLINICAL DATA:  Respiratory distress.  EXAM: PORTABLE CHEST - 1  VIEW  COMPARISON:  08/07/2013  FINDINGS: Chronic cardiomegaly. Diffuse interstitial opacity with fissural thickening. There may be small pleural effusions. No pneumothorax.  IMPRESSION: Cardiomegaly and pulmonary edema.   Electronically Signed   By: Jorje Guild M.D.   On: 11/10/2013 05:41    Scheduled Meds: . aspirin EC  81 mg Oral Daily  . clopidogrel  75 mg Oral Q breakfast  . furosemide  40 mg Intravenous BID  . hydrALAZINE  25 mg Oral Q8H  . ipratropium-albuterol  3 mL Nebulization QID  . isosorbide mononitrate  30 mg Oral Daily  . metoprolol tartrate  25 mg Oral BID  . mometasone-formoterol  2 puff Inhalation BID  . pantoprazole  40 mg Oral Daily  . sodium chloride  3 mL Intravenous Q12H  . tiotropium  18 mcg Inhalation Daily   Continuous Infusions: . heparin 900 Units/hr (11/10/13 0700)    Principal  Problem:   Acute decompensated heart failure Active Problems:   Peripheral arterial disease   Coronary artery disease   Asthma   COPD (chronic obstructive pulmonary disease)   Non-STEMI (non-ST elevated myocardial infarction)   Chronic kidney disease    Time spent: >35 minutes     Kinnie Feil  [facility] Pager [phone]. If 7PM-7AM, please contact night-coverage at [email or web address], password [facility] 11/10/2013, 9:33 AM  LOS: 0 days

## 2013-11-10 NOTE — ED Notes (Signed)
Troponin results reported to Dr.Yao

## 2013-11-10 NOTE — ED Notes (Signed)
Pt arrived from home via [facility], c/o difficulty breathing. Pt on home oxygen 2 L/min. EMS arrived to find o2 sat 84%, BP 160/100, HR100-110, since on CPAP o2 sat 93-94%.

## 2013-11-10 NOTE — ED Provider Notes (Signed)
CSN: [id number]     Arrival date & time 11/10/13  1062 History   First MD Initiated Contact with Patient 11/10/13 [phone]     Chief Complaint  Patient presents with  . Respiratory Distress   (Consider location/radiation/quality/duration/timing/severity/associated sxs/prior Treatment) The history is provided by the patient.  TOCCARA [name] is a 80 y.o. female hx of DVT not on coumadin, HTN, COPD, CHF here with SOB. Trouble breathing for the last day or so. Resides at the nursing home. For shortness of breath got worse tonight. She also has some nonproductive cough as well. She was noted to have an oxygen saturation of 84% by EMS. She is also tachypneic to the mid 30s so is placed on BiPAP and did well.   Level V caveat- condition of patient.   Past Medical History  Diagnosis Date  . DVT (deep venous thrombosis)   . Acid reflux   . Hypertension   . Hypercholesterolemia   . Anginal pain   . Asthma   . Hyponatremia   . Congestive heart failure   . PONV (postoperative nausea and vomiting)   . Myocardial infarction 2014; 07/03/2013  . COPD (chronic obstructive pulmonary disease)   . Pneumonia     "as a child and again in 2013" (07/04/2013)  . Chronic bronchitis     "once or twice/yr" (07/04/2013)  . Exertional shortness of breath   . Pernicious anemia   . History of blood transfusion     "cause my blood was low" (07/04/2013)  . NIOEVOJJ(009.3)     "probably weekly" (07/04/2013)  . Acoustic neuroma     "Dr. Suzanna Obey is testing for this in her right ear right now" (07/04/2013)  . Deafness in right ear   . Arthritis     "left knee; back" (07/04/2013)  . Lumbar compression fracture 2010; 2012    "fell; fell" (07/04/2013)  . Anxiety   . Chronic cystitis    Past Surgical History  Procedure Laterality Date  . Abdominal surgery  1960's    "tumor removed" (07/04/2013)  . Inguinal hernia repair Right   . Vesicovaginal fistula closure w/ tah    . Breast biopsy Left 1980's  . Knee  arthroscopy Left 1980's  . Cataract extraction, bilateral Bilateral ~ 2011  . Tonsillectomy  1930's  . Cholecystectomy    . Appendectomy    . Bladder suspension  05/16/2005    Hattie Perch 05/16/2005 (07/04/2013)  . Femoral-popliteal bypass graft Left 1990's  . Cardiac catheterization  2012  . Coronary angioplasty with stent placement  07/10/2013    "1" (07/10/2013)   Family History  Problem Relation Age of Onset  . Emphysema Brother     2 brothers  . Emphysema Father   . Heart failure Mother   . Diabetes Mother   . Diabetes Mother    History  Substance Use Topics  . Smoking status: Never Smoker   . Smokeless tobacco: Never Used  . Alcohol Use: No   OB History   Grav Para Term Preterm Abortions TAB SAB Ect Mult Living                 Review of Systems  Respiratory: Positive for cough and shortness of breath.   All other systems reviewed and are negative.    Allergies  Sulfa antibiotics  Home Medications   Current Outpatient Rx  Name  Route  Sig  Dispense  Refill  . acetaminophen (TYLENOL) 325 MG tablet   Oral  Take 650 mg by mouth 2 (two) times daily.         Marland Kitchen albuterol (PROVENTIL HFA;VENTOLIN HFA) 108 (90 BASE) MCG/ACT inhaler   Inhalation   Inhale 2 puffs into the lungs every 6 (six) hours as needed for wheezing.         Marland Kitchen aspirin EC 81 MG tablet   Oral   Take 81 mg by mouth daily.         . Cholecalciferol (VITAMIN D) 1000 UNITS capsule   Oral   Take 1,000 Units by mouth daily.         . clopidogrel (PLAVIX) 75 MG tablet   Oral   Take 75 mg by mouth daily with breakfast.         . Fluticasone-Salmeterol (ADVAIR) 100-50 MCG/DOSE AEPB   Inhalation   Inhale 1 puff into the lungs every 12 (twelve) hours.         . furosemide (LASIX) 40 MG tablet   Oral   Take 40 mg by mouth daily.         . isosorbide mononitrate (IMDUR) 30 MG 24 hr tablet   Oral   Take 30 mg by mouth daily.         . metoprolol tartrate (LOPRESSOR) 25 MG tablet    Oral   Take 25 mg by mouth 2 (two) times daily.         . pantoprazole (PROTONIX) 40 MG tablet   Oral   Take 40 mg by mouth daily.         . potassium chloride (KLOR-CON) 20 MEQ packet   Oral   Take 20 mEq by mouth 2 (two) times daily.         Marland Kitchen tiotropium (SPIRIVA) 18 MCG inhalation capsule   Inhalation   Place 18 mcg into inhaler and inhale daily.         . nitroGLYCERIN (NITROSTAT) 0.4 MG SL tablet   Sublingual   Place 0.4 mg under the tongue every 5 (five) minutes as needed.            BP 143/71  Pulse 103  Temp(Src) 98.8 F (37.1 C) (Oral)  Resp 24  SpO2 100% Physical Exam  Nursing note and vitals reviewed. Constitutional: She is oriented to person, place, and time.  Chronically ill, on bipap, slightly tachypneic.   HENT:  Head: Normocephalic.  Mouth/Throat: Oropharynx is clear and moist.  Eyes: Conjunctivae are normal. Pupils are equal, round, and reactive to light.  Neck: Normal range of motion. Neck supple.  Cardiovascular: Regular rhythm and normal heart sounds.   Tachycardic   Pulmonary/Chest:  Tachypneic, mod air movement, + wheezing throughout   Abdominal: Soft. Bowel sounds are normal. She exhibits no distension. There is no tenderness. There is no rebound.  Musculoskeletal: Normal range of motion.  1+ edema bilaterally   Neurological: She is alert and oriented to person, place, and time. No cranial nerve deficit. Coordination normal.  Skin: Skin is warm and dry.  Psychiatric: She has a normal mood and affect. Her behavior is normal. Judgment and thought content normal.    ED Course  Procedures (including critical care time)  CRITICAL CARE Performed by: Darl Householder, DAVID   Total critical care time:30 min   Critical care time was exclusive of separately billable procedures and treating other patients.  Critical care was necessary to treat or prevent imminent or life-threatening deterioration.  Critical care was time spent personally by me on  the following activities:  development of treatment plan with patient and/or surrogate as well as nursing, discussions with consultants, evaluation of patient's response to treatment, examination of patient, obtaining history from patient or surrogate, ordering and performing treatments and interventions, ordering and review of laboratory studies, ordering and review of radiographic studies, pulse oximetry and re-evaluation of patient's condition.   Labs Review Labs Reviewed  CBC WITH DIFFERENTIAL - Abnormal; Notable for the following:    Hemoglobin 11.7 (*)    HCT 35.7 (*)    Neutrophils Relative % 86 (*)    Lymphocytes Relative 7 (*)    Lymphs Abs 0.5 (*)    All other components within normal limits  COMPREHENSIVE METABOLIC PANEL - Abnormal; Notable for the following:    Sodium 148 (*)    Potassium 3.4 (*)    CO2 36 (*)    Glucose, Bld 177 (*)    BUN 30 (*)    Creatinine, Ser 1.42 (*)    GFR calc non Af Amer 33 (*)    GFR calc Af Amer 38 (*)    All other components within normal limits  PRO B NATRIURETIC PEPTIDE - Abnormal; Notable for the following:    Pro B Natriuretic peptide (BNP) 1921.0 (*)    All other components within normal limits  POCT I-STAT TROPONIN I - Abnormal; Notable for the following:    Troponin i, poc 0.70 (*)    All other components within normal limits  HEPARIN LEVEL (UNFRACTIONATED)  URINALYSIS, ROUTINE W REFLEX MICROSCOPIC   Imaging Review No results found.  EKG Interpretation    Date/Time:  Sunday November 10 2013 03:44:56 EST Ventricular Rate:  97 PR Interval:  188 QRS Duration: 101 QT Interval:  348 QTC Calculation: 442 R Axis:   -21 Text Interpretation:  Sinus rhythm LVH with secondary repolarization abnormality Anterior Q waves, possibly due to LVH No significant change since last tracing Confirmed by YAO  MD, DAVID [phone]) on 11/10/2013 3:55:03 AM            MDM  No diagnosis found. MANJIT BUFANO is a 80 y.o. female here with SOB.  Likely COPD vs CHF vs pneumonia. Will get labs, cxr. Will give nebs and check BNP. Likely need admission.   5:17 AM BNP elevated, diuresed. Trop elevated, started on heparin drip. I called Dr. Radford Pax, who will see patient. Recommend medicine admission. Also given nebs and steroids for COPD exacerbation. She is admitted for NSTEMI, heart failure, COPD exacerbation.     Kristi Arthurs, MD 11/10/13 [phone]

## 2013-11-10 NOTE — Progress Notes (Signed)
Md notified of pts trop. 2.21 critical.  Awaiting new orders.  VS stable will continue to monitor. [name]

## 2013-11-10 NOTE — Consult Note (Addendum)
Admit date: 11/10/2013 Referring Physician  Dr. Posey Pronto Primary Cardiologist  Dr. Irish Lack Reason for Consultation  SOB  HPI: [name] is a 80 y.o. female hx of DVT not on coumadin, HTN, COPD, CHF here with SOB. She has had rouble breathing for the last day or so. She lives at the nursing home. She states that her shortness of breath got worse last night and she presented to the ER early this am. She also has some nonproductive cough as well. She was noted to have an oxygen saturation of 84% by EMS. She is also tachypneic to the mid 30s so is placed on BiPAP and did well. She was found to have an elevated BNP c/w acute CHF and her troponin was also elevated and Cardiology is now asked to consult.     PMH:   Past Medical History  Diagnosis Date  . DVT (deep venous thrombosis)   . Acid reflux   . Hypertension   . Hypercholesterolemia   . Anginal pain   . Asthma   . Hyponatremia   . Congestive heart failure   . PONV (postoperative nausea and vomiting)   . Myocardial infarction 2014; 07/03/2013  . COPD (chronic obstructive pulmonary disease)   . Pneumonia     "as a child and again in 2013" (07/04/2013)  . Chronic bronchitis     "once or twice/yr" (07/04/2013)  . Exertional shortness of breath   . Pernicious anemia   . History of blood transfusion     "cause my blood was low" (07/04/2013)  . XAJOINOM(767.2)     "probably weekly" (07/04/2013)  . Acoustic neuroma     "Dr. Melissa Montane is testing for this in her right ear right now" (07/04/2013)  . Deafness in right ear   . Arthritis     "left knee; back" (07/04/2013)  . Lumbar compression fracture 2010; 2012    "fell; fell" (07/04/2013)  . Anxiety   . Chronic cystitis      PSH:   Past Surgical History  Procedure Laterality Date  . Abdominal surgery  1960's    "tumor removed" (07/04/2013)  . Inguinal hernia repair Right   . Vesicovaginal fistula closure w/ tah    . Breast biopsy Left 1980's  . Knee arthroscopy Left 1980's  .  Cataract extraction, bilateral Bilateral ~ 2011  . Tonsillectomy  1930's  . Cholecystectomy    . Appendectomy    . Bladder suspension  05/16/2005    Archie Endo 05/16/2005 (07/04/2013)  . Femoral-popliteal bypass graft Left 1990's  . Cardiac catheterization  2012  . Coronary angioplasty with stent placement  07/10/2013    "1" (07/10/2013)    Allergies:  Sulfa antibiotics Prior to Admit Meds:   (Not in a hospital admission) Fam HX:    Family History  Problem Relation Age of Onset  . Emphysema Brother     2 brothers  . Emphysema Father   . Heart failure Mother   . Diabetes Mother   . Diabetes Mother    Social HX:    History   Social History  . Marital Status: Married    Spouse Name: N/A    Number of Children: 2  . Years of Education: N/A   Occupational History  . 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 Smoking status: Never Smoker   . Smokeless tobacco: Never Used  . Alcohol Use: No  . Drug Use: No  . Sexual Activity:  No   Other Topics Concern  . Not on file   Social History Narrative  . No narrative on file     ROS:  All 11 ROS were addressed and are negative except what is stated in the HPI  Physical Exam: Blood pressure 143/71, pulse 103, temperature 98.8 F (37.1 C), temperature source Oral, resp. rate 24, SpO2 100.00%.    General: Well developed, well nourished, in no acute distress Head: Eyes PERRLA, No xanthomas.   Normal cephalic and atramatic  Lungs:   Crackles at bases bilaterally and wheezing throughout Heart:   HRRR S1 S2 Pulses are 2+ & equal.            No carotid bruit. No JVD.  No abdominal bruits. No femoral bruits. Abdomen: Bowel sounds are positive, abdomen soft and non-tender without masses  Extremities:   1+ edema Neuro: Alert and oriented X 3. Psych:  Good affect, responds appropriately    Labs:   Lab Results  Component Value Date   WBC 7.3 11/10/2013   HGB 11.7* 11/10/2013   HCT 35.7* 11/10/2013   MCV 84.8 11/10/2013   PLT  193 11/10/2013    Recent Labs Lab 11/10/13 0358  NA 148*  K 3.4*  CL 97  CO2 36*  BUN 30*  CREATININE 1.42*  CALCIUM 9.0  PROT 6.5  BILITOT 0.3  ALKPHOS 92  ALT 12  AST 23  GLUCOSE 177*   No results found for this basename: PTT   Lab Results  Component Value Date   INR 0.99 07/08/2013   INR 1.05 07/04/2013   INR 0.91 07/01/2011   Lab Results  Component Value Date   CKTOTAL 93 02/08/2013   CKMB 5.5* 02/08/2013   TROPONINI <0.30 08/07/2013     Lab Results  Component Value Date   CHOL 177 07/05/2013   CHOL 148 07/02/2011   Lab Results  Component Value Date   HDL 33* 07/05/2013   HDL 32* 07/02/2011   Lab Results  Component Value Date   LDLCALC 69 07/05/2013   LDLCALC 81 07/02/2011   Lab Results  Component Value Date   TRIG 373* 07/05/2013   TRIG 175* 07/02/2011   Lab Results  Component Value Date   CHOLHDL 5.4 07/05/2013   CHOLHDL 4.6 07/02/2011   No results found for this basename: LDLDIRECT       EKG:  NSR with septal infarct, LVH by voltage and nonspecific T wave abnormality  ASSESSMENT:  1.  Acute combined systolic/diastolic CHF - ? Secondary to acute hypertensive urgency.   2.  Mild LV dysfunction EF 40-50% by echo 02/2013 and cath 06/2013 3.  NSTEMI type II secondary to demand ischemia in setting of known LAD and RCA disease - unlikely to represent acute coronary syndrome 4.  COPD with asthma 5.  HTN 6.  ASCAD with chronically occluded LAD, 90% mid RCA recent PCI of OM1 - family refused consideration of CABG now s/p PCI of OM1 06/2103 7.  Hypokalemia 8.  Acute on chronic kidney disease  PLAN:   1.  Continue to cycle cardiac enzymes 2.  IV Lasix 40mg  IV daily 3.  Continue ASA/Plavix/Imdur/Beta blocker 4.  Replete potassium  Sueanne Margarita, MD  11/10/2013  5:25 AM

## 2013-11-10 NOTE — Progress Notes (Signed)
ANTICOAGULATION CONSULT NOTE - Follow Up Consult  Pharmacy Consult for heparin gtt Indication: chest pain/ACS  Allergies  Allergen Reactions  . Sulfa Antibiotics Other (See Comments)    unknown    Patient Measurements: Height: 5\' 3"  (160 cm) Weight: 167 lb 8.8 oz (76 kg) IBW/kg (Calculated) : 52.4 Heparin Dosing Weight: 65 kg  Vital Signs: Temp: 98.9 F (37.2 C) (01/04 1538) Temp src: Oral (01/04 1538) BP: 122/66 mmHg (01/04 1538) Pulse Rate: 87 (01/04 1538)  Labs:  Recent Labs  11/10/13 0358 11/10/13 0526 11/10/13 1500  HGB 11.7*  --   --   HCT 35.7*  --   --   PLT 193  --   --   HEPARINUNFRC  --   --  0.34  CREATININE 1.42*  --   --   TROPONINI  --  2.21*  --     Estimated Creatinine Clearance: 28.3 ml/min (by C-G formula based on Cr of 1.42).   Medications:  Scheduled:  . aspirin EC  81 mg Oral Daily  . clopidogrel  75 mg Oral Q breakfast  . furosemide  40 mg Intravenous BID  . hydrALAZINE  25 mg Oral Q8H  . ipratropium-albuterol  3 mL Nebulization QID  . isosorbide mononitrate  30 mg Oral Daily  . metoprolol tartrate  25 mg Oral BID  . mometasone-formoterol  2 puff Inhalation BID  . pantoprazole  40 mg Oral Daily  . sodium chloride  3 mL Intravenous Q12H  . tiotropium  18 mcg Inhalation Daily   Infusions:  . heparin 900 Units/hr (11/10/13 0700)    Assessment: 80 yo F with history of coronary artery disease with recent cardiac catheterization and PCI. She presented with SOB found to be hypertensive, tachycardic, and with an elevated troponin.  Pharmacy was consulted to dose heparin gtt.  Heparin IV gtt has been running at 900 units/hr and first heparin level has resulted therapeutic at 0.34.  Initial H/H is low at 11.7/35.7 and Plt are wnl.  No reports of bleeding are noted.  Goal of Therapy:  Heparin level 0.3-0.7 units/ml Monitor platelets by anticoagulation protocol: Yes   Plan:  - continue heparin IV gtt at rate of 900 units/hr - recheck 8h  HL - daily HL and CBC - monitor for s/s of bleeding  Ovid Curd E. Jacqlyn Larsen, PharmD Clinical Pharmacist - Resident Pager: [phone] Pharmacy: [phone] 11/10/2013 3:46 PM

## 2013-11-10 NOTE — H&P (Signed)
[facility] History and Physical  Patient: [name]  HLK:[id number]  DOB: Jul 14, 1926  DOS: the patient was seen and examined on 11/10/2013 PCP: [name], MD  Chief Complaint: Shortness of breath  HPI: [name] is a 80 y.o. female with Past medical history of coronary artery disease with recent cardiac catheterization and PCI portable vessel disease, hypertension, COPD, history of combined diastolic and systolic heart failure, GERD. The patient is coming from home. The patient is presenting with complaints of shortness of breath that started tonight. She mentions that she was at her baseline during the day and after her meal she started having complaints of shortness of breath this was associated with some dry cough. As per her daughter she has history of panic attacks and anxiety with similar picture and they tried that routine to calm her down which did not help her and therefore they called the EMS. The patient denies any complaint of chest pain or chest tightness. Pt denies any fever, chills, headache, cough, chest pain, palpitation, orthopnea, PND, nausea, vomiting, abdominal pain, diarrhea, constipation, active bleeding, burning urination, dizziness, pedal edema,  focal neurological deficit.  Daughter denies any weight gain  Review of Systems: as mentioned in the history of present illness.  A Comprehensive review of the other systems is negative.  Past Medical History  Diagnosis Date  . Acid reflux   . Hypertension   . Hypercholesterolemia   . Anginal pain   . Asthma   . Hyponatremia   . Congestive heart failure   . PONV (postoperative nausea and vomiting)   . Myocardial infarction 2014; 07/03/2013  . COPD (chronic obstructive pulmonary disease)   . Pneumonia     "as a child and again in 2013" (07/04/2013)  . Chronic bronchitis     "once or twice/yr" (07/04/2013)  . Exertional shortness of breath   . Pernicious anemia   . History of blood  transfusion     "cause my blood was low" (07/04/2013)  . TDSKAJGO(115.7)     "probably weekly" (07/04/2013)  . Acoustic neuroma     "Dr. Melissa Montane is testing for this in her right ear right now" (07/04/2013)  . Deafness in right ear   . Arthritis     "left knee; back" (07/04/2013)  . Lumbar compression fracture 2010; 2012    "fell; fell" (07/04/2013)  . Anxiety   . Chronic cystitis    Past Surgical History  Procedure Laterality Date  . Abdominal surgery  1960's    "tumor removed" (07/04/2013)  . Inguinal hernia repair Right   . Vesicovaginal fistula closure w/ tah    . Breast biopsy Left 1980's  . Knee arthroscopy Left 1980's  . Cataract extraction, bilateral Bilateral ~ 2011  . Tonsillectomy  1930's  . Cholecystectomy    . Appendectomy    . Bladder suspension  05/16/2005    Archie Endo 05/16/2005 (07/04/2013)  . Femoral-popliteal bypass graft Left 1990's  . Cardiac catheterization  2012  . Coronary angioplasty with stent placement  07/10/2013    "1" (07/10/2013)   Social History:  reports that she has never smoked. She has never used smokeless tobacco. She reports that she does not drink alcohol or use illicit drugs. Independent for most of her  ADL.  Allergies  Allergen Reactions  . Sulfa Antibiotics Other (See Comments)    unknown    Family History  Problem Relation Age of Onset  . Emphysema Brother     2 brothers  .  Emphysema Father   . Heart failure Mother   . Diabetes Mother   . Diabetes Mother     Prior to Admission medications   Medication Sig Start Date End Date Taking? Authorizing Provider  acetaminophen (TYLENOL) 325 MG tablet Take 650 mg by mouth 2 (two) times daily.   Yes Historical Provider, MD  albuterol (PROVENTIL HFA;VENTOLIN HFA) 108 (90 BASE) MCG/ACT inhaler Inhale 2 puffs into the lungs every 6 (six) hours as needed for wheezing.   Yes Historical Provider, MD  aspirin EC 81 MG tablet Take 81 mg by mouth daily.   Yes Historical Provider, MD  Cholecalciferol  (VITAMIN D) 1000 UNITS capsule Take 1,000 Units by mouth daily.   Yes Historical Provider, MD  clopidogrel (PLAVIX) 75 MG tablet Take 75 mg by mouth daily with breakfast.   Yes Historical Provider, MD  Fluticasone-Salmeterol (ADVAIR) 100-50 MCG/DOSE AEPB Inhale 1 puff into the lungs every 12 (twelve) hours.   Yes Historical Provider, MD  furosemide (LASIX) 40 MG tablet Take 40 mg by mouth 2 (two) times daily.    Yes Historical Provider, MD  isosorbide mononitrate (IMDUR) 30 MG 24 hr tablet Take 30 mg by mouth daily.   Yes Historical Provider, MD  metoprolol tartrate (LOPRESSOR) 25 MG tablet Take 25 mg by mouth 2 (two) times daily.   Yes Historical Provider, MD  pantoprazole (PROTONIX) 40 MG tablet Take 40 mg by mouth daily.   Yes Historical Provider, MD  potassium chloride (KLOR-CON) 20 MEQ packet Take 20 mEq by mouth 2 (two) times daily.   Yes Historical Provider, MD  tiotropium (SPIRIVA) 18 MCG inhalation capsule Place 18 mcg into inhaler and inhale daily.   Yes Historical Provider, MD  nitroGLYCERIN (NITROSTAT) 0.4 MG SL tablet Place 0.4 mg under the tongue every 5 (five) minutes as needed.      Historical Provider, MD    Physical Exam: Filed Vitals:   11/10/13 0345 11/10/13 0405 11/10/13 0627 11/10/13 0630  BP: 143/71  126/56 111/61  Pulse: 103  100 106  Temp: 98.8 F (37.1 C)  99.6 F (37.6 C)   TempSrc: Oral  Oral   Resp: 24   22  Height:   5\' 3"  (1.6 m)   Weight:   76 kg (167 lb 8.8 oz)   SpO2: 99% 100% 95% 95%    General: Alert, Awake and Oriented to Time, Place and Person. Appear in moderate distress Eyes: PERRL ENT: Oral Mucosa clear moist. Neck: Mild JVD Cardiovascular: S1 and S2 Present, aortic systolic Murmur, Peripheral Pulses Present Respiratory: Bilateral Air entry equal and Decreased, bilateral basal Crackles, expiratory wheezes Abdomen: Bowel Sound Present, Soft and Non tender Skin: No Rash Extremities: No Pedal edema, no calf tenderness Neurologic: Grossly  Unremarkable.  Labs on Admission:  CBC:  Recent Labs Lab 11/10/13 0358  WBC 7.3  NEUTROABS 6.2  HGB 11.7*  HCT 35.7*  MCV 84.8  PLT 193    CMP     Component Value Date/Time   NA 148* 11/10/2013 0358   K 3.4* 11/10/2013 0358   CL 97 11/10/2013 0358   CO2 36* 11/10/2013 0358   GLUCOSE 177* 11/10/2013 0358   BUN 30* 11/10/2013 0358   CREATININE 1.42* 11/10/2013 0358   CALCIUM 9.0 11/10/2013 0358   PROT 6.5 11/10/2013 0358   ALBUMIN 3.6 11/10/2013 0358   AST 23 11/10/2013 0358   ALT 12 11/10/2013 0358   ALKPHOS 92 11/10/2013 0358   BILITOT 0.3 11/10/2013 0358   GFRNONAA  33* 11/10/2013 0358   GFRAA 38* 11/10/2013 0358    No results found for this basename: LIPASE, AMYLASE,  in the last 168 hours No results found for this basename: AMMONIA,  in the last 168 hours   Recent Labs Lab 11/10/13 0526  TROPONINI 2.21*   BNP (last 3 results)  Recent Labs  08/07/13 0340 08/09/13 0558 11/10/13 0358  PROBNP 2804.0* 929.9* 1921.0*    Radiological Exams on Admission: Dg Chest Portable 1 View  11/10/2013   CLINICAL DATA:  Respiratory distress.  EXAM: PORTABLE CHEST - 1 VIEW  COMPARISON:  08/07/2013  FINDINGS: Chronic cardiomegaly. Diffuse interstitial opacity with fissural thickening. There may be small pleural effusions. No pneumothorax.  IMPRESSION: Cardiomegaly and pulmonary edema.   Electronically Signed   By: Jorje Guild M.D.   On: 11/10/2013 05:41    EKG: Independently reviewed. normal sinus rhythm, nonspecific ST and T waves changes.  Assessment/Plan Principal Problem:   Acute decompensated heart failure Active Problems:   Peripheral arterial disease   Coronary artery disease   Asthma   COPD (chronic obstructive pulmonary disease)   Non-STEMI (non-ST elevated myocardial infarction)   Chronic kidney disease   1. Acute decompensated heart failure The patient is presenting with complaints of sudden onset of shortness of breath. She initially had blood pressure 160/100 before giving her  Lasix at the time of arrival. This provider to present of flash pulmonary edema secondary to hypertensive urgency. Her blood pressure has improved at present significantly. She has responded well to IV Lasix with 500 cc of urine. She at present feels significantly better and denies any chest pain. Her breathing has also improved and she is able to talk in full sentences. With this she will be admitted to step down unit. Cardiology has been consulted who recommends to continue IV diuresis. She will be continued on aspirin and Plavix, hydralazine, Imdur, metoprolol.  2. History of COPD At present her current presentation does not suggest COPD exacerbation. And her wheezing appears pleuritic cardiac in nature due to sudden onset. She will be continued on nebulizers as needed and spiriva.  3. Non-STEMI Cardiology has been consulted She will be continued on heparin drip due to elevated troponin Aspirin and Plavix Lipitor 80 Echocardiogram in the morning Further recommendation by cardiology She does have history of triple vessel cardiac disease and has refused CABG in the past and has undergone PTCA recently.  4. Chronic kidney disease Continue to monitor serum creatinine Replacing potassium Avoiding nephrotoxic medications  Consults: Cardiology appreciate input  DVT Prophylaxis: mechanical compression device Nutrition: Cardiac diet  Code Status: Full  Family Communication: Daughter was present at bedside, opportunity was given to ask question and all questions were answered satisfactorily at the time of interview. Disposition: Admitted to inpatient in step-down unit.  Author: Berle Mull, MD [facility] Pager: [phone] 11/10/2013, 6:47 AM    If 7PM-7AM, please contact night-coverage [email or web address] Password [email or web address]

## 2013-11-10 NOTE — Progress Notes (Signed)
ANTICOAGULATION CONSULT NOTE - Initial Consult  Pharmacy Consult for heparin Indication: chest pain/ACS  Allergies  Allergen Reactions  . Sulfa Antibiotics Other (See Comments)    unknown    Patient Measurements: Heparin dosing weight: 65kg  Vital Signs: Temp: 98.8 F (37.1 C) (01/04 0345) Temp src: Oral (01/04 0345) BP: 143/71 mmHg (01/04 0345) Pulse Rate: 103 (01/04 0345)  Labs:  Recent Labs  11/10/13 0358  HGB 11.7*  HCT 35.7*  PLT 193     Medical History: Past Medical History  Diagnosis Date  . DVT (deep venous thrombosis)   . Acid reflux   . Hypertension   . Hypercholesterolemia   . Anginal pain   . Asthma   . Hyponatremia   . Congestive heart failure   . PONV (postoperative nausea and vomiting)   . Myocardial infarction 2014; 07/03/2013  . COPD (chronic obstructive pulmonary disease)   . Pneumonia     "as a child and again in 2013" (07/04/2013)  . Chronic bronchitis     "once or twice/yr" (07/04/2013)  . Exertional shortness of breath   . Pernicious anemia   . History of blood transfusion     "cause my blood was low" (07/04/2013)  . UJWJXBJY(782.9)     "probably weekly" (07/04/2013)  . Acoustic neuroma     "Dr. Melissa Montane is testing for this in her right ear right now" (07/04/2013)  . Deafness in right ear   . Arthritis     "left knee; back" (07/04/2013)  . Lumbar compression fracture 2010; 2012    "fell; fell" (07/04/2013)  . Anxiety   . Chronic cystitis      Assessment: 80yo female c/o difficulty breathing, found to be hypertensive w/ HR to 110, troponin elevated, to begin heparin.  Goal of Therapy:  Heparin level 0.3-0.7 units/ml Monitor platelets by anticoagulation protocol: Yes   Plan:  Will give heparin 3000 units IV bolus x1 followed by gtt at 900 units/hr and monitor heparin levels and CBC.  Wynona Neat, PharmD, BCPS  11/10/2013,4:44 AM

## 2013-11-10 NOTE — Progress Notes (Signed)
Subjective:  C/o mainly of indigestion and abdominal pain.  Breathing is better. No definite chest pain  Objective:  Vital Signs in the last 24 hours: BP 141/69  Pulse 99  Temp(Src) 98.4 F (36.9 C) (Oral)  Resp 26  Ht 5\' 3"  (1.6 m)  Wt 76 kg (167 lb 8.8 oz)  BMI 29.69 kg/m2  SpO2 96%  Physical Exam: Elderly female mild confused Lungs:  Clear Cardiac:  Regular rhythm, normal S1 and S2, no S3 Abdomen:  Soft, nontender, no masses Extremities:  No edema present  Intake/Output from previous day:    Weight Filed Weights   11/10/13 0627  Weight: 76 kg (167 lb 8.8 oz)    Lab Results: Basic Metabolic Panel:  Recent Labs  11/10/13 0358  NA 148*  K 3.4*  CL 97  CO2 36*  GLUCOSE 177*  BUN 30*  CREATININE 1.42*   CBC:  Recent Labs  11/10/13 0358  WBC 7.3  NEUTROABS 6.2  HGB 11.7*  HCT 35.7*  MCV 84.8  PLT 193   Cardiac Enzymes:  Recent Labs  11/10/13 0526  TROPONINI 2.21*    Telemetry: Sinus tachycardia   Assessment/Plan: 1. Acute systolic and diastolic CHF with pulmonary edema 2. Type 2 non stemi  Rec:  Increase diuretics and watch enzymes.  ECHO pending. Continue heparin today.       Kerry Hough  MD [facility]  11/10/2013, 9:29 AM

## 2013-11-11 DIAGNOSIS — J9600 Acute respiratory failure, unspecified whether with hypoxia or hypercapnia: Secondary | ICD-10-CM

## 2013-11-11 DIAGNOSIS — I5021 Acute systolic (congestive) heart failure: Secondary | ICD-10-CM

## 2013-11-11 DIAGNOSIS — J441 Chronic obstructive pulmonary disease with (acute) exacerbation: Secondary | ICD-10-CM

## 2013-11-11 DIAGNOSIS — I2510 Atherosclerotic heart disease of native coronary artery without angina pectoris: Secondary | ICD-10-CM

## 2013-11-11 LAB — CBC
HCT: 30.5 % — ABNORMAL LOW (ref 36.0–46.0)
HEMOGLOBIN: 9.7 g/dL — AB (ref 12.0–15.0)
MCH: 27.2 pg (ref 26.0–34.0)
MCHC: 31.5 g/dL (ref 30.0–36.0)
MCV: 86.4 fL (ref 78.0–100.0)
Platelets: 194 10*3/uL (ref 150–400)
RBC: 3.53 MIL/uL — ABNORMAL LOW (ref 3.87–5.11)
RDW: 14.8 % (ref 11.5–15.5)
WBC: 8.5 10*3/uL (ref 4.0–10.5)

## 2013-11-11 LAB — BASIC METABOLIC PANEL
BUN: 32 mg/dL — ABNORMAL HIGH (ref 6–23)
CHLORIDE: 98 meq/L (ref 96–112)
CO2: 31 mEq/L (ref 19–32)
CREATININE: 1.39 mg/dL — AB (ref 0.50–1.10)
Calcium: 9.4 mg/dL (ref 8.4–10.5)
GFR calc Af Amer: 39 mL/min — ABNORMAL LOW (ref >90)
GFR calc non Af Amer: 34 mL/min — ABNORMAL LOW (ref >90)
GLUCOSE: 130 mg/dL — AB (ref 70–99)
POTASSIUM: 3.5 meq/L — AB (ref 3.7–5.3)
Sodium: 143 mEq/L (ref 137–147)

## 2013-11-11 LAB — HEPARIN LEVEL (UNFRACTIONATED)
HEPARIN UNFRACTIONATED: 0.6 [IU]/mL (ref 0.30–0.70)
Heparin Unfractionated: 0.5 IU/mL (ref 0.30–0.70)

## 2013-11-11 LAB — GLUCOSE, CAPILLARY: Glucose-Capillary: 205 mg/dL — ABNORMAL HIGH (ref 70–99)

## 2013-11-11 LAB — TROPONIN I: Troponin I: 1.16 ng/mL (ref <0.30)

## 2013-11-11 MED ORDER — FUROSEMIDE 10 MG/ML IJ SOLN
40.0000 mg | Freq: Every day | INTRAMUSCULAR | Status: DC
  Administered 2013-11-12: 40 mg via INTRAVENOUS
  Filled 2013-11-11: qty 4

## 2013-11-11 MED ORDER — SALINE SPRAY 0.65 % NA SOLN
1.0000 | NASAL | Status: DC | PRN
  Administered 2013-11-12: 1 via NASAL
  Filled 2013-11-11: qty 44

## 2013-11-11 NOTE — Care Management Note (Signed)
    Page 1 of 1   11/11/2013     12:12:28 PM   CARE MANAGEMENT NOTE 11/11/2013  Patient:  PAMALA, HAYMAN   Account Number:  [id number]  Date Initiated:  11/11/2013  Documentation initiated by:  Elissa Hefty  Subjective/Objective Assessment:   adm w mi     Action/Plan:   lives w husband, pcp dr Earle Gell   Anticipated DC Date:     Anticipated DC Plan:           Choice offered to / List presented to:             Status of service:   Medicare Important Message given?   (If response is "NO", the following Medicare IM given date fields will be blank) Date Medicare IM given:   Date Additional Medicare IM given:    Discharge Disposition:    Per UR Regulation:  Reviewed for med. necessity/level of care/duration of stay  If discussed at [place] of Stay Meetings, dates discussed:    Comments:

## 2013-11-11 NOTE — Progress Notes (Signed)
Pharmacist Heart Failure Core Measure Documentation  Assessment: [name] has an EF documented as 35=40% on 11/10/13 by Echo.  Rationale: Heart failure patients with left ventricular systolic dysfunction (LVSD) and an EF < 40% should be prescribed an angiotensin converting enzyme inhibitor (ACEI) or angiotensin receptor blocker (ARB) at discharge unless a contraindication is documented in the medical record.  This patient is not currently on an ACEI or ARB for HF.  This note is being placed in the record in order to provide documentation that a contraindication to the use of these agents is present for this encounter.  ACE Inhibitor or Angiotensin Receptor Blocker is contraindicated (specify all that apply)  []   ACEI allergy AND ARB allergy []   Angioedema []   Moderate or severe aortic stenosis []   Hyperkalemia []   Hypotension []   Renal artery stenosis [x]   Worsening renal function, preexisting renal disease or dysfunction   Hildred Laser, Pharm D 11/11/2013 3:38 PM

## 2013-11-11 NOTE — Progress Notes (Signed)
[facility] PROGRESS NOTE  [name] VPX:[id number] DOB: April 11, 1926 DOA: 11/10/2013 PCP: Kristi Fair, MD  Assessment/Plan: 80 y/o female with PMH of COPD on home oxygen, HTN, CAD, CHF presented with SOB admitted with CHF,  NSTEMI  1. Acute on chronic CHF/pulmonary edema; EF 40-50% by echo 02/2013 and cath 06/2013 -improving; cont diuresis per cardiology; cont aspirin and Plavix, hydralazine, Imdur, metoprolol; prn NiPPV -repeat echo: LVEF 35-40%;    2. COPD, chronic respiratory failure on home oxygen; CXR: no clear infiltrate  -cont inhalers, oxygen;   3. NSTEMI started on IV heparin; cont aspirin and Plavix, hydralazine, Imdur, metoprolol -management per cardiology  4. CAD: LAD, RCA recent PCI of OM1 per cardiology family refused consideration of CABG -management cont per cardiology   5. HTN cont home regimen, monitor   Code Status: full Family Communication: d/w patient, and her daughter  (indicate person spoken with, relationship, and if by phone, the number) Disposition Plan: home pend clinical improvement    Consultants:  Cardiology   Procedures:  Pend echo  Antibiotics:  None  (indicate start date, and stop date if known)  HPI/Subjective: aletr   Objective: Filed Vitals:   11/11/13 0523  BP: 103/43  Pulse:   Temp:   Resp:     Intake/Output Summary (Last 24 hours) at 11/11/13 0817 Last data filed at 11/11/13 0600  Gross per 24 hour  Intake   1198 ml  Output   2900 ml  Net  -1702 ml   Filed Weights   11/10/13 0627 11/11/13 0300  Weight: 76 kg (167 lb 8.8 oz) 73.6 kg (162 lb 4.1 oz)    Exam:   General:  alert  Cardiovascular: s1,s2 rrr  Respiratory: BL crackles   Abdomen: soft, nt, nd   Musculoskeletal: no edema    Data Reviewed: Basic Metabolic Panel:  Recent Labs Lab 11/10/13 0358 11/11/13 0355  NA 148* 143  K 3.4* 3.5*  CL 97 98  CO2 36* 31  GLUCOSE 177* 130*  BUN 30* 32*  CREATININE 1.42* 1.39*  CALCIUM  9.0 9.4   Liver Function Tests:  Recent Labs Lab 11/10/13 0358  AST 23  ALT 12  ALKPHOS 92  BILITOT 0.3  PROT 6.5  ALBUMIN 3.6   No results found for this basename: LIPASE, AMYLASE,  in the last 168 hours No results found for this basename: AMMONIA,  in the last 168 hours CBC:  Recent Labs Lab 11/10/13 0358 11/11/13 0355  WBC 7.3 8.5  NEUTROABS 6.2  --   HGB 11.7* 9.7*  HCT 35.7* 30.5*  MCV 84.8 86.4  PLT 193 194   Cardiac Enzymes:  Recent Labs Lab 11/10/13 0526  TROPONINI 2.21*   BNP (last 3 results)  Recent Labs  08/07/13 0340 08/09/13 0558 11/10/13 0358  PROBNP 2804.0* 929.9* 1921.0*   CBG: No results found for this basename: GLUCAP,  in the last 168 hours  Recent Results (from the past 240 hour(s))  MRSA PCR SCREENING     Status: None   Collection Time    11/10/13  6:18 AM      Result Value Range Status   MRSA by PCR NEGATIVE  NEGATIVE Final   Comment:            The GeneXpert MRSA Assay (FDA     approved for NASAL specimens     only), is one component of a     comprehensive MRSA colonization     surveillance program. It is not  intended to diagnose MRSA     infection nor to guide or     monitor treatment for     MRSA infections.     Studies: Dg Chest Portable 1 View  11/10/2013   CLINICAL DATA:  Respiratory distress.  EXAM: PORTABLE CHEST - 1 VIEW  COMPARISON:  08/07/2013  FINDINGS: Chronic cardiomegaly. Diffuse interstitial opacity with fissural thickening. There may be small pleural effusions. No pneumothorax.  IMPRESSION: Cardiomegaly and pulmonary edema.   Electronically Signed   By: Jorje Guild M.D.   On: 11/10/2013 05:41    Scheduled Meds: . aspirin EC  81 mg Oral Daily  . clopidogrel  75 mg Oral Q breakfast  . furosemide  40 mg Intravenous BID  . hydrALAZINE  25 mg Oral Q8H  . isosorbide mononitrate  30 mg Oral Daily  . metoprolol tartrate  25 mg Oral BID  . mometasone-formoterol  2 puff Inhalation BID  . pantoprazole   40 mg Oral Daily  . sodium chloride  3 mL Intravenous Q12H  . tiotropium  18 mcg Inhalation Daily   Continuous Infusions: . heparin 900 Units/hr (11/11/13 0303)    Principal Problem:   Acute decompensated heart failure Active Problems:   Peripheral arterial disease   Coronary artery disease   Asthma   COPD (chronic obstructive pulmonary disease)   Non-STEMI (non-ST elevated myocardial infarction)   Chronic kidney disease    Time spent: >35 minutes     Kinnie Feil  [facility] Pager [phone]. If 7PM-7AM, please contact night-coverage at [email or web address], password [facility] 11/11/2013, 8:17 AM  LOS: 1 day

## 2013-11-11 NOTE — Progress Notes (Signed)
ANTICOAGULATION CONSULT NOTE - Follow Up Consult  Pharmacy Consult for heparin gtt Indication: chest pain/ACS  Allergies  Allergen Reactions  . Sulfa Antibiotics Other (See Comments)    unknown    Patient Measurements: Height: 5\' 3"  (160 cm) Weight: 162 lb 4.1 oz (73.6 kg) IBW/kg (Calculated) : 52.4 Heparin Dosing Weight: 65 kg  Vital Signs: Temp: 97.8 F (36.6 C) (01/05 0900) Temp src: Oral (01/05 0900) BP: 101/48 mmHg (01/05 0900) Pulse Rate: 81 (01/05 0900)  Labs:  Recent Labs  11/10/13 0358 11/10/13 0526 11/10/13 1500 11/11/13 0035 11/11/13 0355  HGB 11.7*  --   --   --  9.7*  HCT 35.7*  --   --   --  30.5*  PLT 193  --   --   --  194  HEPARINUNFRC  --   --  0.34 0.50 0.60  CREATININE 1.42*  --   --   --  1.39*  TROPONINI  --  2.21*  --   --   --     Estimated Creatinine Clearance: 28.4 ml/min (by C-G formula based on Cr of 1.39).   Medications:  Scheduled:  . aspirin EC  81 mg Oral Daily  . clopidogrel  75 mg Oral Q breakfast  . [START ON 11/12/2013] furosemide  40 mg Intravenous Daily  . hydrALAZINE  25 mg Oral Q8H  . isosorbide mononitrate  30 mg Oral Daily  . metoprolol tartrate  25 mg Oral BID  . mometasone-formoterol  2 puff Inhalation BID  . pantoprazole  40 mg Oral Daily  . sodium chloride  3 mL Intravenous Q12H  . tiotropium  18 mcg Inhalation Daily   Infusions:  . heparin 900 Units/hr (11/11/13 0303)    Assessment: 80 yo F with history of coronary artery disease with recent cardiac catheterization and PCI. She presented with SOB found to be hypertensive, tachycardic, and with an elevated troponin.  Pharmacy was consulted to dose heparin gtt.    Heparin level today is at goal (HL= 0.6). Hg noted 11.7>>9.7 with no signs of bleeding noted.   Goal of Therapy:  Heparin level 0.3-0.7 units/ml Monitor platelets by anticoagulation protocol: Yes   Plan:  -Continue heparin IV gtt at rate of 900 units/hr -Daily heparin level and CBC  Hildred Laser, Pharm D 11/11/2013 12:03 PM

## 2013-11-11 NOTE — Progress Notes (Signed)
ANTICOAGULATION CONSULT NOTE - Follow Up Consult  Pharmacy Consult for heparin Indication: NSTEMI  Labs:  Recent Labs  11/10/13 0358 11/10/13 0526 11/10/13 1500 11/11/13 0035  HGB 11.7*  --   --   --   HCT 35.7*  --   --   --   PLT 193  --   --   --   HEPARINUNFRC  --   --  0.34 0.50  CREATININE 1.42*  --   --   --   TROPONINI  --  2.21*  --   --     Assessment/Plan:  80yo female remains therapeutic on heparin for NSTEMI.  Will continue gtt at current rate and continue to monitor.  Wynona Neat, PharmD, BCPS  11/11/2013,2:05 AM

## 2013-11-11 NOTE — Progress Notes (Signed)
PROGRESS NOTE  Subjective:   Ms. Divelbiss is an 80 yo with hx of HTN, CAD, COPD, DVT ( not on coumadin).  She was admitted with worsening dyspnea.   She was initially hypertensive - improved after getting lasix.   She felt much better after putting out 500 cc urine.   Her Troponin level yesterday AM was elevated - no further levels have been drawn.   Objective:    Vital Signs:   Temp:  [97.8 F (36.6 C)-99 F (37.2 C)] 97.8 F (36.6 C) (01/05 0900) Pulse Rate:  [81-92] 81 (01/05 0900) Resp:  [16-24] 24 (01/05 0900) BP: (101-123)/(41-66) 101/48 mmHg (01/05 0900) SpO2:  [93 %-99 %] 99 % (01/05 0931) FiO2 (%):  [40 %] 40 % (01/04 1201) Weight:  [162 lb 4.1 oz (73.6 kg)] 162 lb 4.1 oz (73.6 kg) (01/05 0300)  Last BM Date: 11/08/13   24-hour weight change: Weight change: -5 lb 4.7 oz (-2.4 kg)  Weight trends: Filed Weights   11/10/13 0627 11/11/13 0300  Weight: 167 lb 8.8 oz (76 kg) 162 lb 4.1 oz (73.6 kg)    Intake/Output:  01/04 0701 - 01/05 0700 In: 1207 [P.O.:1000; I.V.:207] Out: 2900 [Urine:2900] Total I/O In: -  Out: 225 [Urine:225]   Physical Exam: BP 101/48  Pulse 81  Temp(Src) 97.8 F (36.6 C) (Oral)  Resp 24  Ht 5\' 3"  (1.6 m)  Wt 162 lb 4.1 oz (73.6 kg)  BMI 28.75 kg/m2  SpO2 99%  General: Vital signs reviewed and noted.   Head: Normocephalic, atraumatic.  Eyes: conjunctivae/corneas clear.  EOM's intact.   Throat: normal  Neck:  normal  Lungs:    basilar rales  Heart:  RR, normal S1, S2  Abdomen:  Soft, non-tender, non-distended    Extremities: No edema   Neurologic: A&O X3, CN II - XII are grossly intact.   Psych: Normal     Labs: BMET:  Recent Labs  11/10/13 0358 11/11/13 0355  NA 148* 143  K 3.4* 3.5*  CL 97 98  CO2 36* 31  GLUCOSE 177* 130*  BUN 30* 32*  CREATININE 1.42* 1.39*  CALCIUM 9.0 9.4    Liver function tests:  Recent Labs  11/10/13 0358  AST 23  ALT 12  ALKPHOS 92  BILITOT 0.3  PROT 6.5  ALBUMIN  3.6   No results found for this basename: LIPASE, AMYLASE,  in the last 72 hours  CBC:  Recent Labs  11/10/13 0358 11/11/13 0355  WBC 7.3 8.5  NEUTROABS 6.2  --   HGB 11.7* 9.7*  HCT 35.7* 30.5*  MCV 84.8 86.4  PLT 193 194    Cardiac Enzymes:  Recent Labs  11/10/13 0526  TROPONINI 2.21*    Coagulation Studies: No results found for this basename: LABPROT, INR,  in the last 72 hours  Other: No components found with this basename: POCBNP,  No results found for this basename: DDIMER,  in the last 72 hours No results found for this basename: HGBA1C,  in the last 72 hours No results found for this basename: CHOL, HDL, LDLCALC, TRIG, CHOLHDL,  in the last 72 hours No results found for this basename: TSH, T4TOTAL, FREET3, T3FREE, THYROIDAB,  in the last 72 hours No results found for this basename: VITAMINB12, FOLATE, FERRITIN, TIBC, IRON, RETICCTPCT,  in the last 72 hours   Other results:  Tele:  NSR  I have personally reviewed the angiograms by Dr. Irish Lack in Sept.  She  has severe 3 V CAD - s/0 PCI of the LCx/OM.  She has residula severe LAD disease ( which is probably not easily approachable by PCI) and RCA disease. She does not want to have CABG/.   Medications:    Infusions: . heparin 900 Units/hr (11/11/13 0303)    Scheduled Medications: . aspirin EC  81 mg Oral Daily  . clopidogrel  75 mg Oral Q breakfast  . furosemide  40 mg Intravenous BID  . hydrALAZINE  25 mg Oral Q8H  . isosorbide mononitrate  30 mg Oral Daily  . metoprolol tartrate  25 mg Oral BID  . mometasone-formoterol  2 puff Inhalation BID  . pantoprazole  40 mg Oral Daily  . sodium chloride  3 mL Intravenous Q12H  . tiotropium  18 mcg Inhalation Daily   Echo: LV EF - 35-40%.  Mod LAE   Assessment/ Plan:      1.   Acute decompensated heart failure:   She has acute on chronic combined systolic and diastolic CHF.  She has severe CAD that has not been completely revascularized.  She does not  want CABG.  Will continue medical therapy.   On Lasix 40 IV BID --> will lower to 1 QD Hydralazine 25  TID Imdur 30 a day Metoprolol 25 bid  Her BP is low this am.  Would hold Imdur, metoprolol, Lasix.  Will reassess BP at 2 PM prior to hydralazine.  Keep foley in for now to assess I/O        2.  Coronary artery disease:  She has severe CAD that has not been completely revascularized.  She does not want CABG.  Will continue medical therapy.   Cont. Heparin drip, imdur ( will hold this am due to hypotension) metoprolol      Asthma   COPD (chronic obstructive pulmonary disease)   Non-STEMI (non-ST elevated myocardial infarction)   Chronic kidney disease   Disposition:  Length of Stay: 1  Thayer Headings, Brooke Bonito., MD, [facility] 11/11/2013, 10:18 AM Office [phone] Pager [phone]

## 2013-11-12 DIAGNOSIS — I10 Essential (primary) hypertension: Secondary | ICD-10-CM

## 2013-11-12 LAB — CBC
HEMATOCRIT: 32.5 % — AB (ref 36.0–46.0)
HEMOGLOBIN: 10.2 g/dL — AB (ref 12.0–15.0)
MCH: 27.2 pg (ref 26.0–34.0)
MCHC: 31.4 g/dL (ref 30.0–36.0)
MCV: 86.7 fL (ref 78.0–100.0)
Platelets: 176 10*3/uL (ref 150–400)
RBC: 3.75 MIL/uL — AB (ref 3.87–5.11)
RDW: 15 % (ref 11.5–15.5)
WBC: 9.1 10*3/uL (ref 4.0–10.5)

## 2013-11-12 LAB — GLUCOSE, CAPILLARY: Glucose-Capillary: 113 mg/dL — ABNORMAL HIGH (ref 70–99)

## 2013-11-12 LAB — HEPARIN LEVEL (UNFRACTIONATED): HEPARIN UNFRACTIONATED: 0.33 [IU]/mL (ref 0.30–0.70)

## 2013-11-12 MED ORDER — FUROSEMIDE 40 MG PO TABS
40.0000 mg | ORAL_TABLET | Freq: Every day | ORAL | Status: DC
  Administered 2013-11-13: 10:00:00 40 mg via ORAL
  Filled 2013-11-12: qty 1

## 2013-11-12 MED ORDER — HYDRALAZINE HCL 10 MG PO TABS
10.0000 mg | ORAL_TABLET | Freq: Three times a day (TID) | ORAL | Status: DC
  Administered 2013-11-12 – 2013-11-13 (×2): 10 mg via ORAL
  Filled 2013-11-12 (×6): qty 1

## 2013-11-12 MED ORDER — ACETAMINOPHEN 325 MG PO TABS
650.0000 mg | ORAL_TABLET | ORAL | Status: DC | PRN
  Administered 2013-11-12 (×2): 650 mg via ORAL
  Filled 2013-11-12 (×3): qty 2

## 2013-11-12 NOTE — Progress Notes (Signed)
[facility] PROGRESS NOTE  [name] IHK:[id number] DOB: 11-16-1925 DOA: 11/10/2013 PCP: [name], MD  Assessment/Plan: 80 y/o female with PMH of COPD on home oxygen, HTN, CAD, CHF presented with SOB admitted with CHF,  NSTEMI  1. Acute on chronic CHF/pulmonary edema; EF 40-50% by echo 02/2013 and cath 06/2013 -improving; cont diuresis per cardiology; labile BP; cont aspirin and Plavix, hydralazine, Imdur, metoprolol; prn NiPPV -repeated echo: LVEF 35-40%;    2. COPD, chronic respiratory failure on home oxygen; CXR: no clear infiltrate  -cont inhalers, oxygen;   3. NSTEMI started on IV heparin; cont aspirin and Plavix, hydralazine, Imdur, metoprolol -management per cardiology  4. CAD: LAD, RCA recent PCI of OM1 per cardiology family refused consideration of CABG -management cont per cardiology   5. HTN cont home regimen, monitor   D/c plans per cardiology   Code Status: full Family Communication: d/w patient, and her daughter  (indicate person spoken with, relationship, and if by phone, the number) Disposition Plan: home pend clinical improvement    Consultants:  Cardiology   Procedures:  Pend echo  Antibiotics:  None  (indicate start date, and stop date if known)  HPI/Subjective: aletr   Objective: Filed Vitals:   11/12/13 0811  BP: 121/63  Pulse: 89  Temp: 99.5 F (37.5 C)  Resp: 15    Intake/Output Summary (Last 24 hours) at 11/12/13 0830 Last data filed at 11/12/13 0800  Gross per 24 hour  Intake    456 ml  Output   1920 ml  Net  -1464 ml   Filed Weights   11/10/13 0627 11/11/13 0300 11/12/13 0300  Weight: 76 kg (167 lb 8.8 oz) 73.6 kg (162 lb 4.1 oz) 74.9 kg (165 lb 2 oz)    Exam:   General:  alert  Cardiovascular: s1,s2 rrr  Respiratory: BL crackles   Abdomen: soft, nt, nd   Musculoskeletal: no edema    Data Reviewed: Basic Metabolic Panel:  Recent Labs Lab 11/10/13 0358 11/11/13 0355  NA 148* 143  K 3.4*  3.5*  CL 97 98  CO2 36* 31  GLUCOSE 177* 130*  BUN 30* 32*  CREATININE 1.42* 1.39*  CALCIUM 9.0 9.4   Liver Function Tests:  Recent Labs Lab 11/10/13 0358  AST 23  ALT 12  ALKPHOS 92  BILITOT 0.3  PROT 6.5  ALBUMIN 3.6   No results found for this basename: LIPASE, AMYLASE,  in the last 168 hours No results found for this basename: AMMONIA,  in the last 168 hours CBC:  Recent Labs Lab 11/10/13 0358 11/11/13 0355 11/12/13 0455  WBC 7.3 8.5 9.1  NEUTROABS 6.2  --   --   HGB 11.7* 9.7* 10.2*  HCT 35.7* 30.5* 32.5*  MCV 84.8 86.4 86.7  PLT 193 194 176   Cardiac Enzymes:  Recent Labs Lab 11/10/13 0526 11/11/13 2035  TROPONINI 2.21* 1.16*   BNP (last 3 results)  Recent Labs  08/07/13 0340 08/09/13 0558 11/10/13 0358  PROBNP 2804.0* 929.9* 1921.0*   CBG:  Recent Labs Lab 11/11/13 0912  GLUCAP 205*    Recent Results (from the past 240 hour(s))  MRSA PCR SCREENING     Status: None   Collection Time    11/10/13  6:18 AM      Result Value Range Status   MRSA by PCR NEGATIVE  NEGATIVE Final   Comment:            The GeneXpert MRSA Assay (FDA  approved for NASAL specimens     only), is one component of a     comprehensive MRSA colonization     surveillance program. It is not     intended to diagnose MRSA     infection nor to guide or     monitor treatment for     MRSA infections.     Studies: No results found.  Scheduled Meds: . aspirin EC  81 mg Oral Daily  . clopidogrel  75 mg Oral Q breakfast  . furosemide  40 mg Intravenous Daily  . hydrALAZINE  25 mg Oral Q8H  . isosorbide mononitrate  30 mg Oral Daily  . metoprolol tartrate  25 mg Oral BID  . mometasone-formoterol  2 puff Inhalation BID  . pantoprazole  40 mg Oral Daily  . sodium chloride  3 mL Intravenous Q12H  . tiotropium  18 mcg Inhalation Daily   Continuous Infusions: . heparin 900 Units/hr (11/12/13 0650)    Principal Problem:   Acute decompensated heart  failure Active Problems:   Peripheral arterial disease   Coronary artery disease   Asthma   COPD (chronic obstructive pulmonary disease)   Non-STEMI (non-ST elevated myocardial infarction)   Chronic kidney disease    Time spent: >35 minutes     Kinnie Feil  [facility] Pager [phone]. If 7PM-7AM, please contact night-coverage at [email or web address], password [facility] 11/12/2013, 8:30 AM  LOS: 2 days

## 2013-11-12 NOTE — Progress Notes (Signed)
PROGRESS NOTE  Subjective:   [name] is an 80 yo with hx of HTN, CAD, COPD, DVT ( not on coumadin).  She was admitted with worsening dyspnea.   She was initially hypertensive - improved after getting lasix.   She felt much better after putting out 500 cc urine.   She has no cp or significant dyspnea today.   Objective:    Vital Signs:   Temp:  [98.6 F (37 C)-99.9 F (37.7 C)] 99.5 F (37.5 C) (01/06 0811) Pulse Rate:  [71-94] 89 (01/06 0811) Resp:  [12-21] 15 (01/06 0811) BP: (74-128)/(35-65) 121/63 mmHg (01/06 0811) SpO2:  [92 %-100 %] 99 % (01/06 0850) Weight:  [165 lb 2 oz (74.9 kg)] 165 lb 2 oz (74.9 kg) (01/06 0300)  Last BM Date: 11/10/13   24-hour weight change: Weight change: 2 lb 13.9 oz (1.3 kg)  Weight trends: Filed Weights   11/10/13 0627 11/11/13 0300 11/12/13 0300  Weight: 167 lb 8.8 oz (76 kg) 162 lb 4.1 oz (73.6 kg) 165 lb 2 oz (74.9 kg)    Intake/Output:  01/05 0701 - 01/06 0700 In: 456 [P.O.:240; I.V.:216] Out: 1770 [Urine:1770] Total I/O In: 27 [I.V.:27] Out: 300 [Urine:300]   Physical Exam: BP 121/63  Pulse 89  Temp(Src) 99.5 F (37.5 C) (Oral)  Resp 15  Ht 5\' 3"  (1.6 m)  Wt 165 lb 2 oz (74.9 kg)  BMI 29.26 kg/m2  SpO2 99%  General: Vital signs reviewed and noted.   Head: Normocephalic, atraumatic.  Eyes: conjunctivae/corneas clear.  EOM's intact.   Throat: normal  Neck:  normal  Lungs:   few  basilar rales  Heart:  RR, normal S1, S2  Abdomen:  Soft, non-tender, non-distended    Extremities: No edema   Neurologic: A&O X3, CN II - XII are grossly intact.   Psych: Normal     Labs: BMET:  Recent Labs  11/10/13 0358 11/11/13 0355  NA 148* 143  K 3.4* 3.5*  CL 97 98  CO2 36* 31  GLUCOSE 177* 130*  BUN 30* 32*  CREATININE 1.42* 1.39*  CALCIUM 9.0 9.4    Liver function tests:  Recent Labs  11/10/13 0358  AST 23  ALT 12  ALKPHOS 92  BILITOT 0.3  PROT 6.5  ALBUMIN 3.6   No results found for this  basename: LIPASE, AMYLASE,  in the last 72 hours  CBC:  Recent Labs  11/10/13 0358 11/11/13 0355 11/12/13 0455  WBC 7.3 8.5 9.1  NEUTROABS 6.2  --   --   HGB 11.7* 9.7* 10.2*  HCT 35.7* 30.5* 32.5*  MCV 84.8 86.4 86.7  PLT 193 194 176    Cardiac Enzymes:  Recent Labs  11/10/13 0526 11/11/13 2035  TROPONINI 2.21* 1.16*    Coagulation Studies: No results found for this basename: LABPROT, INR,  in the last 72 hours    Other results:  Tele:  NSR  I have personally reviewed the angiograms by Dr. Irish Lack in Sept.  She has severe 3 V CAD - s/0 PCI of the LCx/OM.  She has residula severe LAD disease ( which is probably not easily approachable by PCI) and RCA disease.    Medications:    Infusions: . heparin 900 Units/hr (11/12/13 0650)    Scheduled Medications: . aspirin EC  81 mg Oral Daily  . clopidogrel  75 mg Oral Q breakfast  . furosemide  40 mg Intravenous Daily  . hydrALAZINE  10 mg  Oral Q8H  . isosorbide mononitrate  30 mg Oral Daily  . metoprolol tartrate  25 mg Oral BID  . mometasone-formoterol  2 puff Inhalation BID  . pantoprazole  40 mg Oral Daily  . sodium chloride  3 mL Intravenous Q12H  . tiotropium  18 mcg Inhalation Daily   Echo: LV EF - 35-40%.  Mod LAE   Assessment/ Plan:      1.   Acute decompensated heart failure:   She has acute on chronic combined systolic and diastolic CHF.  She has severe CAD that has not been completely revascularized.    Initially she was thought to be a poor candidate for CABG and patient and daughter [name]).  Patient wanted to know if CABG was a possibility.  I have review angiograms and have talked with Dr. Irish Lack.  He thinks our best choice is for continued medical therapy.  If she continues to have CP / dyspnea, he would consider PCI of the RCA.  His last choice would be to refer her for CABG; he thinks she would have a very difficult recovery.  Continue  Hydralazine 10  TID Imdur 30 a day Metoprolol  25 bid   DC foley.  She needs to ambulate       2.  Coronary artery disease:  She has severe CAD that has not been completely revascularized.   Will continue medical therapy.    Initially she was thought to be a poor candidate for CABG and patient and daughter [name]).  Patient wanted to know if CABG was a possibility.  I have review angiograms and have talked with Dr. Irish Lack.  He thinks our best choice is for continued medical therapy.  If she continues to have CP / dyspnea, he would consider PCI of the RCA.  His last choice would be to refer her for CABG; he thinks she would have a very difficult recovery.  I agree with his thoughts and plan.  Will DC heparin, ambulate.      Asthma   COPD (chronic obstructive pulmonary disease)   Non-STEMI (non-ST elevated myocardial infarction)   Chronic kidney disease   Anticipate DC tomorrow if she does well.    Disposition:  Length of Stay: 2  Ramond Dial., MD, [facility] 11/12/2013, 11:20 AM Office [phone] Pager [phone]

## 2013-11-13 LAB — CBC
HCT: 32.5 % — ABNORMAL LOW (ref 36.0–46.0)
HEMOGLOBIN: 10.2 g/dL — AB (ref 12.0–15.0)
MCH: 27.1 pg (ref 26.0–34.0)
MCHC: 31.4 g/dL (ref 30.0–36.0)
MCV: 86.4 fL (ref 78.0–100.0)
Platelets: 187 10*3/uL (ref 150–400)
RBC: 3.76 MIL/uL — AB (ref 3.87–5.11)
RDW: 14.9 % (ref 11.5–15.5)
WBC: 4.8 10*3/uL (ref 4.0–10.5)

## 2013-11-13 LAB — BASIC METABOLIC PANEL
BUN: 29 mg/dL — AB (ref 6–23)
CHLORIDE: 100 meq/L (ref 96–112)
CO2: 27 meq/L (ref 19–32)
CREATININE: 1.22 mg/dL — AB (ref 0.50–1.10)
Calcium: 10.1 mg/dL (ref 8.4–10.5)
GFR calc Af Amer: 45 mL/min — ABNORMAL LOW (ref >90)
GFR calc non Af Amer: 39 mL/min — ABNORMAL LOW (ref >90)
Glucose, Bld: 99 mg/dL (ref 70–99)
POTASSIUM: 3.7 meq/L (ref 3.7–5.3)
Sodium: 139 mEq/L (ref 137–147)

## 2013-11-13 LAB — GLUCOSE, CAPILLARY: Glucose-Capillary: 101 mg/dL — ABNORMAL HIGH (ref 70–99)

## 2013-11-13 MED ORDER — HYDRALAZINE HCL 10 MG PO TABS: 10.0000 mg | ORAL_TABLET | Freq: Three times a day (TID) | ORAL | Status: DC

## 2013-11-13 MED ORDER — FUROSEMIDE 40 MG PO TABS: 40.0000 mg | ORAL_TABLET | Freq: Every day | ORAL | Status: DC

## 2013-11-13 NOTE — Progress Notes (Signed)
Patient discharged to home with husband.  Discharge instructions provided to patient prior to discharge.  Patient and husband voiced understanding of discharge instructions and education.  IV removed prior to discharge; IV site clean, dry, and intact.

## 2013-11-13 NOTE — Discharge Summary (Signed)
Physician Discharge Summary  [name] [id number] DOB: 09-12-1926 DOA: 11/10/2013  PCP: [name], MD  Admit date: 11/10/2013 Discharge date: 11/13/2013  Time spent: >30 minutes  Recommendations for Outpatient Follow-up:  Follow-up Information   Follow up with [name], MD. (in [place], call for appt upon discharge)    Specialty:  Gastroenterology   Contact information:   [address], [address] [place] [address] [phone]       Follow up with [name]., MD. (in 1week, call for appt upon discharge)    Specialty:  Cardiology   Contact information:   [address] [place] [address] [phone]        Discharge Diagnoses:  Principal Problem:   Acute decompensated heart failure Active Problems:   Peripheral arterial disease   Coronary artery disease   Asthma   COPD (chronic obstructive pulmonary disease)   Non-STEMI (non-ST elevated myocardial infarction)   Chronic kidney disease   Discharge Condition: Improved/stable  Diet recommendation: Low sodium heart healthy  Filed Weights   11/12/13 1203 11/12/13 2000 11/13/13 0424  Weight: 73 kg (160 lb 15 oz) 65.499 kg (144 lb 6.4 oz) 65.2 kg (143 lb 11.8 oz)    History of present illness:  Pt is a 80 y.o. female with Past medical history of coronary artery disease with recent cardiac catheterization and PCI portable vessel disease, hypertension, COPD, history of combined diastolic and systolic heart failure, GERD.  The patient is coming from home.  The patient is presenting with complaints of shortness of breath that started tonight. She mentions that she was at her baseline during the day and after her meal she started having complaints of shortness of breath this was associated with some dry cough. As per her daughter she has history of panic attacks and anxiety with similar picture and they tried that routine to calm her down which did not help her and  therefore they called the EMS.  The patient denies any complaint of chest pain or chest tightness.  Daughter denies any weight gain. She was admitted for further evaluation and management.   Hospital Course:   1. Acute on chronic CHF/pulmonary edema; EF 40-50% by echo 02/2013 and cath 06/2013  -As discussed above, upon admission patient had cardiac enzymes cycled and they came back elevated. She was chest pain-free. -cardiology was consulted and she was maintained on her diuresis with Lasix-she was changed to 40 mg daily as she improved. She had an echocardiogram and it came back with an EF of 35-40% (from 40-50% in April of 2014). Hydralazine and Imdur was added to patient's treatment regimen and on she was maintained on metoprolol as well which she is to continue upon discharge. -Patient is to followup with Dr. Owens Shark as he upon discharge. 2. COPD, chronic respiratory failure on home oxygen; CXR: no clear infiltrate  -cont inhalers, oxygen; remained stable in the hospital. 3. NSTEMI  -As discussed above cardiac enzymes cycled came back elevated. She was started on IV heparin; cont aspirin and Plavix, hydralazine, Imdur, metoprolol  -cardiology was consulted as above and followed patient in the hospital. -2-D echo was done and the results are above -Dr. Acie Fredrickson as the patient and reviewed angiograms and Dr. Dr. Irish Lack who recommended medical therapy and that invasive treatment would be considered only if patient continued to have chest pain/dyspnea. As ready mentioned above she is chest pain-free, and ambulated today without difficulty and will be discharged home to followup with  Dr. Irish Lack 4. CAD: LAD, RCA recent PCI of OM1 per cardiology family refused consideration of CABG  -management cont per cardiology  5. HTN cont home regimen, monitor      Procedures:  2-D echocardiogram Study Conclusions  - Left ventricle: The cavity size was normal. There was mild concentric hypertrophy.  Systolic function was moderately reduced. The estimated ejection fraction was in the range of 35% to 40%. Akinesis of the mid-distalanteroseptal, anterior, and apical myocardium. - Left atrium: The atrium was moderately dilated. Transthoracic echocardiography.  Consultations:  Cardiology  Discharge Exam: Filed Vitals:   11/13/13 1625  BP: 122/58  Pulse: 69  Temp:   Resp:    Exam:  General: alert and oriented x3, hard of hearing. Cardiovascular: s1,s2 RRR Respiratory: Few bibasilar crackles  Abdomen: soft, nt, nd  Musculoskeletal: no edema    Discharge Instructions  Discharge Orders   Future Orders Complete By Expires   Diet - low sodium heart healthy  As directed    Increase activity slowly  As directed        Medication List    STOP taking these medications       acetaminophen 325 MG tablet  Commonly known as:  TYLENOL      TAKE these medications       albuterol 108 (90 BASE) MCG/ACT inhaler  Commonly known as:  PROVENTIL HFA;VENTOLIN HFA  Inhale 2 puffs into the lungs every 6 (six) hours as needed for wheezing.     aspirin EC 81 MG tablet  Take 81 mg by mouth daily.     clopidogrel 75 MG tablet  Commonly known as:  PLAVIX  Take 75 mg by mouth daily with breakfast.     Fluticasone-Salmeterol 100-50 MCG/DOSE Aepb  Commonly known as:  ADVAIR  Inhale 1 puff into the lungs every 12 (twelve) hours.     furosemide 40 MG tablet  Commonly known as:  LASIX  Take 40 mg by mouth  daily.     isosorbide mononitrate 30 MG 24 hr tablet  Commonly known as:  IMDUR  Take 30 mg by mouth daily.     metoprolol tartrate 25 MG tablet  Commonly known as:  LOPRESSOR  Take 25 mg by mouth 2 (two) times daily.     nitroGLYCERIN 0.4 MG SL tablet  Commonly known as:  NITROSTAT  Place 0.4 mg under the tongue every 5 (five) minutes as needed.     pantoprazole 40 MG tablet  Commonly known as:  PROTONIX  Take 40 mg by mouth daily.     potassium chloride 20 MEQ packet   Commonly known as:  KLOR-CON  Take 20 mEq by mouth 2 (two) times daily.     tiotropium 18 MCG inhalation capsule  Commonly known as:  SPIRIVA  Place 18 mcg into inhaler and inhale daily.     Vitamin D 1000 UNITS capsule  Take 1,000 Units by mouth daily.       Allergies  Allergen Reactions  . Sulfa Antibiotics Other (See Comments)    unknown       Follow-up Information   Follow up with [name], MD. (in [place], call for appt upon discharge)    Specialty:  Gastroenterology   Contact information:   [address], [address] [place] [address] [phone]       Follow up with [name]., MD. (in 1week, call for appt upon discharge)    Specialty:  Cardiology   Contact information:  [place] [place] [address] [phone]        The results of significant diagnostics from this hospitalization (including imaging, microbiology, ancillary and laboratory) are listed below for reference.    Significant Diagnostic Studies: Dg Chest Portable 1 View  11/10/2013   CLINICAL DATA:  Respiratory distress.  EXAM: PORTABLE CHEST - 1 VIEW  COMPARISON:  08/07/2013  FINDINGS: Chronic cardiomegaly. Diffuse interstitial opacity with fissural thickening. There may be small pleural effusions. No pneumothorax.  IMPRESSION: Cardiomegaly and pulmonary edema.   Electronically Signed   By: Jorje Guild M.D.   On: 11/10/2013 05:41    Microbiology: Recent Results (from the past 240 hour(s))  MRSA PCR SCREENING     Status: None   Collection Time    11/10/13  6:18 AM      Result Value Range Status   MRSA by PCR NEGATIVE  NEGATIVE Final   Comment:            The GeneXpert MRSA Assay (FDA     approved for NASAL specimens     only), is one component of a     comprehensive MRSA colonization     surveillance program. It is not     intended to diagnose MRSA     infection nor to guide or     monitor treatment for     MRSA infections.      Labs: Basic Metabolic Panel:  Recent Labs Lab 11/10/13 0358 11/11/13 0355 11/13/13 0310  NA 148* 143 139  K 3.4* 3.5* 3.7  CL 97 98 100  CO2 36* 31 27  GLUCOSE 177* 130* 99  BUN 30* 32* 29*  CREATININE 1.42* 1.39* 1.22*  CALCIUM 9.0 9.4 10.1   Liver Function Tests:  Recent Labs Lab 11/10/13 0358  AST 23  ALT 12  ALKPHOS 92  BILITOT 0.3  PROT 6.5  ALBUMIN 3.6   No results found for this basename: LIPASE, AMYLASE,  in the last 168 hours No results found for this basename: AMMONIA,  in the last 168 hours CBC:  Recent Labs Lab 11/10/13 0358 11/11/13 0355 11/12/13 0455 11/13/13 0310  WBC 7.3 8.5 9.1 4.8  NEUTROABS 6.2  --   --   --   HGB 11.7* 9.7* 10.2* 10.2*  HCT 35.7* 30.5* 32.5* 32.5*  MCV 84.8 86.4 86.7 86.4  PLT 193 194 176 187   Cardiac Enzymes:  Recent Labs Lab 11/10/13 0526 11/11/13 2035  TROPONINI 2.21* 1.16*   BNP: BNP (last 3 results)  Recent Labs  08/07/13 0340 08/09/13 0558 11/10/13 0358  PROBNP 2804.0* 929.9* 1921.0*   CBG:  Recent Labs Lab 11/11/13 0912 11/12/13 0813 11/13/13 0551  GLUCAP 205* 113* 101*       Signed:  Dedric Ethington C  [facility] 11/13/2013, 4:35 PM

## 2013-11-13 NOTE — Progress Notes (Signed)
Patient is currently active with [facility] for chronic disease management services.  Patient has been engaged by a [employer].  Our community based plan of care has focused on disease management of CHF, medication procurement/adherence, and community resource support.  Patient will receive a post discharge transition of care call and will be evaluated for monthly home visits for assessments and disease process education.  Spoke with patient and husband.  She weighs and logs her readings daily.  She nor her spouse noted an increase in weight prior to this admission.  Made Inpatient Case Manager aware that [facility] following. Of note, [facility] Care Management services does not replace or interfere with any services that are arranged by inpatient case management or social work.  For additional questions or referrals please contact Corliss Blacker BSN RN [facility]iaison at [phone].

## 2013-11-13 NOTE — Progress Notes (Signed)
Patient Name: [name] Date of Encounter: 11/13/2013     Principal Problem:   Acute decompensated heart failure Active Problems:   Peripheral arterial disease   Coronary artery disease   Asthma   COPD (chronic obstructive pulmonary disease)   Non-STEMI (non-ST elevated myocardial infarction)   Chronic kidney disease    SUBJECTIVE  The patient feels well today.  She is anxious to go home.  She denies any chest pain. She denies any significant dyspnea today.  CURRENT MEDS . aspirin EC  81 mg Oral Daily  . clopidogrel  75 mg Oral Q breakfast  . furosemide  40 mg Oral Daily  . hydrALAZINE  10 mg Oral Q8H  . isosorbide mononitrate  30 mg Oral Daily  . metoprolol tartrate  25 mg Oral BID  . mometasone-formoterol  2 puff Inhalation BID  . pantoprazole  40 mg Oral Daily  . sodium chloride  3 mL Intravenous Q12H  . tiotropium  18 mcg Inhalation Daily    OBJECTIVE  Filed Vitals:   11/12/13 2120 11/12/13 2310 11/13/13 0424 11/13/13 0932  BP: 127/62  99/52 107/64  Pulse:   70 69  Temp:   97.9 F (36.6 C) 98.1 F (36.7 C)  TempSrc:   Oral Oral  Resp:   18 18  Height:      Weight:   143 lb 11.8 oz (65.2 kg)   SpO2:  93% 92% 95%    Intake/Output Summary (Last 24 hours) at 11/13/13 0953 Last data filed at 11/13/13 0934  Gross per 24 hour  Intake    612 ml  Output   1670 ml  Net  -1058 ml   Filed Weights   11/12/13 1203 11/12/13 2000 11/13/13 0424  Weight: 160 lb 15 oz (73 kg) 144 lb 6.4 oz (65.499 kg) 143 lb 11.8 oz (65.2 kg)    PHYSICAL EXAM  General: Pleasant, NAD. Neuro: Alert and oriented X 3. Moves all extremities spontaneously. Psych: Normal affect. HEENT:  Normal  Neck: Supple without bruits or JVD. Lungs:  Mild bibasilar rales Heart: RRR no s3, s4, or murmurs. Abdomen: Soft, non-tender, non-distended, BS + x 4.  Extremities: No clubbing, cyanosis or edema. DP/PT/Radials 2+ and equal bilaterally.  Accessory Clinical Findings  CBC  Recent  Labs  11/12/13 0455 11/13/13 0310  WBC 9.1 4.8  HGB 10.2* 10.2*  HCT 32.5* 32.5*  MCV 86.7 86.4  PLT 176 458   Basic Metabolic Panel  Recent Labs  11/11/13 0355 11/13/13 0310  NA 143 139  K 3.5* 3.7  CL 98 100  CO2 31 27  GLUCOSE 130* 99  BUN 32* 29*  CREATININE 1.39* 1.22*  CALCIUM 9.4 10.1   Liver Function Tests No results found for this basename: AST, ALT, ALKPHOS, BILITOT, PROT, ALBUMIN,  in the last 72 hours No results found for this basename: LIPASE, AMYLASE,  in the last 72 hours Cardiac Enzymes  Recent Labs  11/11/13 2035  TROPONINI 1.16*   BNP No components found with this basename: POCBNP,  D-Dimer No results found for this basename: DDIMER,  in the last 72 hours Hemoglobin A1C No results found for this basename: HGBA1C,  in the last 72 hours Fasting Lipid Panel No results found for this basename: CHOL, HDL, LDLCALC, TRIG, CHOLHDL, LDLDIRECT,  in the last 72 hours Thyroid Function Tests No results found for this basename: TSH, T4TOTAL, FREET3, T3FREE, THYROIDAB,  in the last 72 hours  TELE  Normal sinus rhythm  ECG  2-D echo Study Conclusions  - Left ventricle: The cavity size was normal. There was mild concentric hypertrophy. Systolic function was moderately reduced. The estimated ejection fraction was in the range of 35% to 40%. Akinesis of the mid-distalanteroseptal, anterior, and apical myocardium. - Left atrium: The atrium was moderately dilated.   Radiology/Studies  Dg Chest Portable 1 View  11/10/2013   CLINICAL DATA:  Respiratory distress.  EXAM: PORTABLE CHEST - 1 VIEW  COMPARISON:  08/07/2013  FINDINGS: Chronic cardiomegaly. Diffuse interstitial opacity with fissural thickening. There may be small pleural effusions. No pneumothorax.  IMPRESSION: Cardiomegaly and pulmonary edema.   Electronically Signed   By: Jorje Guild M.D.   On: 11/10/2013 05:41    ASSESSMENT AND PLAN 1. acute decompensated combined systolic and diastolic  heart failure, improved. 2. coronary artery disease. She has severe CAD that has not been completely revascularized. Will continue medical therapy.   Plan: Ambulate this morning and if she does well she should be able to go home later today.  Outpatient followup with Dr. Irish Lack.   Signed, Darlin Coco MD

## 2013-11-22 ENCOUNTER — Encounter: Payer: Self-pay | Admitting: Physician Assistant

## 2013-11-22 ENCOUNTER — Ambulatory Visit (INDEPENDENT_AMBULATORY_CARE_PROVIDER_SITE_OTHER): Payer: Medicare HMO | Admitting: Physician Assistant

## 2013-11-22 VITALS — BP 139/64 | HR 68 | Ht 61.0 in | Wt 140.0 lb

## 2013-11-22 DIAGNOSIS — I5042 Chronic combined systolic (congestive) and diastolic (congestive) heart failure: Secondary | ICD-10-CM

## 2013-11-22 DIAGNOSIS — J449 Chronic obstructive pulmonary disease, unspecified: Secondary | ICD-10-CM

## 2013-11-22 DIAGNOSIS — N189 Chronic kidney disease, unspecified: Secondary | ICD-10-CM

## 2013-11-22 DIAGNOSIS — I2510 Atherosclerotic heart disease of native coronary artery without angina pectoris: Secondary | ICD-10-CM

## 2013-11-22 DIAGNOSIS — I10 Essential (primary) hypertension: Secondary | ICD-10-CM

## 2013-11-22 DIAGNOSIS — R0602 Shortness of breath: Secondary | ICD-10-CM

## 2013-11-22 DIAGNOSIS — E785 Hyperlipidemia, unspecified: Secondary | ICD-10-CM

## 2013-11-22 LAB — BASIC METABOLIC PANEL
BUN: 19 mg/dL (ref 6–23)
CHLORIDE: 105 meq/L (ref 96–112)
CO2: 26 mEq/L (ref 19–32)
Calcium: 10.2 mg/dL (ref 8.4–10.5)
Creatinine, Ser: 1.4 mg/dL — ABNORMAL HIGH (ref 0.4–1.2)
GFR: 39.22 mL/min — ABNORMAL LOW (ref >60.00)
Glucose, Bld: 98 mg/dL (ref 70–99)
Potassium: 4.7 mEq/L (ref 3.5–5.1)
Sodium: 139 mEq/L (ref 135–145)

## 2013-11-22 LAB — BRAIN NATRIURETIC PEPTIDE: PRO B NATRI PEPTIDE: 405 pg/mL — AB (ref 0.0–100.0)

## 2013-11-22 MED ORDER — PRAVASTATIN SODIUM 20 MG PO TABS: 20.0000 mg | ORAL_TABLET | Freq: Every evening | ORAL | Status: DC

## 2013-11-22 NOTE — Patient Instructions (Signed)
Your physician recommends that you continue on your current medications as directed. Please refer to the Current Medication list given to you today.  Labs today: Bmet, Bnp  Keep upcoming appt with Dr.Varanasi for 12/09/13

## 2013-11-22 NOTE — Progress Notes (Addendum)
[address], [place], [place]  [address] Phone: [phone] Fax:  [phone]  Date:  11/22/2013   ID:  [name], DOB 1926/05/23, MRN [id number]  PCP:  [name], MD  Cardiologist:  Dr. Casandra Doffing     History of Present Illness: [name] is a 80 y.o. female with a history of CAD, ischemic cardiomyopathy, combined systolic and diastolic CHF, HTN, COPD, HL, CKD, prior DVT (not on Coumadin).  Echo in 02/2013 demonstrated an EF of 40-50%.  She has been hospitalized several times over the last several months. She suffered a non-STEMI in 06/2013. LHC (06/2013): Proximal LAD 90, mid LAD occluded, mid to distal LAD filled by left to left collaterals, lower pole of the diagonal occluded, proximal OM1 90, proximal to mid RCA 90, EF 40% apical and distal anterior hypokinesis. Patient declined bypass surgery. She ultimately underwent PCI of the OM1 (2.5 x 16 mm Promus DES).  Medical therapy was recommended for her LAD and RCA disease. PCI could be considered of the RCA if she failed medical therapy.  She was readmitted in 08/2013 with volume excess.  She was most recently admitted 1/4-1/7 with a non-STEMI in the setting of acute on chronic systolic and diastolic CHF. This was likely felt to be a type II non-STEMI. Her prior angiograms were reviewed and it was felt that PCI of the RCA could be considered first if she has ongoing symptoms. Referral for CABG would be a last resort. She was stable on medical therapy at discharge.  Echo (11/2011): Mild LVH, EF 35-40%, mid to distal anteroseptal, anterior and apical akinesis, moderate LAE.  She returns for follow up.  She is here with her daughter who is a Marine scientist. THN has been out to her house and is following up with her.  Breathing is improved since discharge from the hospital. She is probably NYHA class 2b-3. She denies orthopnea2, PND or edema. She denies increased abdominal girth. She has occasional chest pains after eating. She  denies any exertional chest discomfort. She denies any syncope.  Recent Labs: 07/04/2013: TSH 1.459  07/05/2013: HDL Cholesterol 33*; LDL (calc) 69  11/10/2013: ALT 12; Pro B Natriuretic peptide (BNP) 1921.0*  11/13/2013: Creatinine 1.22*; Hemoglobin 10.2*; Potassium 3.7   Wt Readings from Last 3 Encounters:  11/22/13 140 lb (63.504 kg)  11/13/13 143 lb 11.8 oz (65.2 kg)  08/14/13 150 lb (68.04 kg)     Past Medical History  Diagnosis Date  . Acid reflux   . Hypertension   . Hypercholesterolemia   . Anginal pain   . Asthma   . Hyponatremia   . Congestive heart failure   . PONV (postoperative nausea and vomiting)   . Myocardial infarction 2014; 07/03/2013  . COPD (chronic obstructive pulmonary disease)   . Pneumonia     "as a child and again in 2013" (07/04/2013)  . Chronic bronchitis     "once or twice/yr" (07/04/2013)  . Exertional shortness of breath   . Pernicious anemia   . History of blood transfusion     "cause my blood was low" (07/04/2013)  . WNIOEVOJ(500.9)     "probably weekly" (07/04/2013)  . Acoustic neuroma     "Dr. Melissa Montane is testing for this in her right ear right now" (07/04/2013)  . Deafness in right ear   . Arthritis     "left knee; back" (07/04/2013)  . Lumbar compression fracture 2010; 2012    "fell; fell" (07/04/2013)  .  Anxiety   . Chronic cystitis     Current Outpatient Prescriptions  Medication Sig Dispense Refill  . albuterol (PROVENTIL HFA;VENTOLIN HFA) 108 (90 BASE) MCG/ACT inhaler Inhale 2 puffs into the lungs every 6 (six) hours as needed for wheezing.      Marland Kitchen aspirin EC 81 MG tablet Take 81 mg by mouth daily.      . Cholecalciferol (VITAMIN D) 1000 UNITS capsule Take 1,000 Units by mouth daily.      . clopidogrel (PLAVIX) 75 MG tablet Take 75 mg by mouth daily with breakfast.      . Fluticasone-Salmeterol (ADVAIR) 100-50 MCG/DOSE AEPB Inhale 1 puff into the lungs every 12 (twelve) hours.      . furosemide (LASIX) 40 MG tablet Take 1 tablet  (40 mg total) by mouth daily.  30 tablet    . isosorbide mononitrate (IMDUR) 30 MG 24 hr tablet Take 30 mg by mouth 2 (two) times daily.       . metoprolol tartrate (LOPRESSOR) 25 MG tablet Take 25 mg by mouth 2 (two) times daily.      . nitroGLYCERIN (NITROSTAT) 0.4 MG SL tablet Place 0.4 mg under the tongue every 5 (five) minutes as needed.        . pantoprazole (PROTONIX) 40 MG tablet Take 40 mg by mouth daily.      . potassium chloride (KLOR-CON) 20 MEQ packet Take 20 mEq by mouth 2 (two) times daily.      Marland Kitchen tiotropium (SPIRIVA) 18 MCG inhalation capsule Place 18 mcg into inhaler and inhale daily.      . pravastatin (PRAVACHOL) 20 MG tablet Take 1 tablet (20 mg total) by mouth every evening.       No current facility-administered medications for this visit.    Allergies:   Sulfa antibiotics   Social History:  The patient  reports that she has never smoked. She has never used smokeless tobacco. She reports that she does not drink alcohol or use illicit drugs.   Family History:  The patient's family history includes Diabetes in her mother and mother; Emphysema in her brother and father; Heart failure in her mother.   ROS:  Please see the history of present illness.   She has a cough with white sputum production. She denies fevers, melena, hematochezia.   All other systems reviewed and negative.   PHYSICAL EXAM: VS:  BP 139/64  Pulse 68  Ht 5\' 1"  (1.549 m)  Wt 140 lb (63.504 kg)  BMI 26.47 kg/m2 Well nourished, well developed, in no acute distress HEENT: normal Neck: no JVD at 90 Cardiac:  normal S1, S2; RRR; no murmur Lungs:  Decreased breath sounds bilaterally, no wheezing, rhonchi or rales Abd: soft, nontender, no hepatomegaly Ext: no edema Skin: warm and dry Neuro:  CNs 2-12 intact, no focal abnormalities noted  EKG:  NSR, HR 68, LAD, inferior and anterolateral T wave inversions, similar to prior tracings     ASSESSMENT AND PLAN:  1. Chronic Combined Systolic and  Diastolic CHF: Volume appears stable. She does report some continued dyspnea. Weights have been stable at home. I will check a followup basic metabolic panel and BNP today. If her BNP is still elevated, consider adjusting her Lasix further. We discussed the importance of weighing daily. She knows to call us if her weight increases 2-3 pounds in one day. 2. CAD: Symptoms are stable. She denies any exertional chest discomfort. Continue current therapy with aspirin, Plavix, nitrates, beta blocker.  I reviewed  the plan with her again today which includes proceeding with PCI of the RCA at she has unstable symptoms. 3. Ischemic Cardiomyopathy: She stopped taking hydralazine secondary to symptomatic hypotension. I suspect she is not on an ACEI or ARB secondary to chronic kidney disease.  We can certainly try to re-initiate hydralazine at a lower dose over time. Continue current dose of beta blocker, nitrates. 4. Chronic Kidney Disease: Check basic metabolic panel today. 5. Hypertension: Controlled. 6. Hyperlipidemia: Continue statin. 7. COPD: This is also likely contributing to her chronic dyspnea. 8. Disposition: Follow up with Dr. Irish Lack next month as planned  Signed, Richardson Dopp, PA-C  11/22/2013 9:43 AM

## 2013-11-26 ENCOUNTER — Telehealth: Payer: Self-pay | Admitting: Physician Assistant

## 2013-11-26 NOTE — Telephone Encounter (Signed)
New message  Patient's daughter is returning your call, please call back.

## 2013-11-27 NOTE — Telephone Encounter (Signed)
pt's daughter Lenna Sciara was given results, I had said it looked like someone gave results yesterday but she said no that she thinks they attempted to give to her father but said nurse was told to call her Lenna Sciara). I apologized for the mix up and she said that was ok and she was very happy to hear the lab results are improving for her mother, I said I agree.

## 2013-12-09 ENCOUNTER — Ambulatory Visit (INDEPENDENT_AMBULATORY_CARE_PROVIDER_SITE_OTHER): Payer: Medicare HMO | Admitting: Interventional Cardiology

## 2013-12-09 ENCOUNTER — Encounter: Payer: Self-pay | Admitting: Interventional Cardiology

## 2013-12-09 VITALS — BP 102/74 | HR 62 | Ht 61.0 in | Wt 144.8 lb

## 2013-12-09 DIAGNOSIS — R0602 Shortness of breath: Secondary | ICD-10-CM

## 2013-12-09 DIAGNOSIS — J449 Chronic obstructive pulmonary disease, unspecified: Secondary | ICD-10-CM

## 2013-12-09 DIAGNOSIS — I2510 Atherosclerotic heart disease of native coronary artery without angina pectoris: Secondary | ICD-10-CM

## 2013-12-09 DIAGNOSIS — I10 Essential (primary) hypertension: Secondary | ICD-10-CM

## 2013-12-09 DIAGNOSIS — R0609 Other forms of dyspnea: Secondary | ICD-10-CM

## 2013-12-09 DIAGNOSIS — I214 Non-ST elevation (NSTEMI) myocardial infarction: Secondary | ICD-10-CM

## 2013-12-09 DIAGNOSIS — R0989 Other specified symptoms and signs involving the circulatory and respiratory systems: Secondary | ICD-10-CM

## 2013-12-09 DIAGNOSIS — N189 Chronic kidney disease, unspecified: Secondary | ICD-10-CM

## 2013-12-09 LAB — BASIC METABOLIC PANEL
BUN: 34 mg/dL — ABNORMAL HIGH (ref 6–23)
CHLORIDE: 106 meq/L (ref 96–112)
CO2: 22 mEq/L (ref 19–32)
Calcium: 9.8 mg/dL (ref 8.4–10.5)
Creatinine, Ser: 1.4 mg/dL — ABNORMAL HIGH (ref 0.4–1.2)
GFR: 37.93 mL/min — ABNORMAL LOW (ref >60.00)
Glucose, Bld: 100 mg/dL — ABNORMAL HIGH (ref 70–99)
Potassium: 4.3 mEq/L (ref 3.5–5.1)
SODIUM: 137 meq/L (ref 135–145)

## 2013-12-09 LAB — BRAIN NATRIURETIC PEPTIDE: Pro B Natriuretic peptide (BNP): 2164 pg/mL — ABNORMAL HIGH (ref 0.0–100.0)

## 2013-12-09 NOTE — Progress Notes (Signed)
Patient ID: [name], female   DOB: 1926/04/05, 80 y.o.   MRN: [id number]  [place], [place],   [address] Phone: [phone] Fax:  [phone]  Date:  12/09/2013   ID:  [name], DOB 13-Mar-1926, MRN [id number]  PCP:  [name], MD  Cardiologist:  Dr. Casandra Doffing     History of Present Illness: [name] is a 80 y.o. female with a history of CAD, ischemic cardiomyopathy, combined systolic and diastolic CHF, HTN, COPD, HL, CKD, prior DVT (not on Coumadin).  Echo in 02/2013 demonstrated an EF of 40-50%.  She has been hospitalized several times over the last several months. She suffered a non-STEMI in 06/2013. LHC (06/2013): Proximal LAD 90, mid LAD occluded, mid to distal LAD filled by left to left collaterals, lower pole of the diagonal occluded, proximal OM1 90, proximal to mid RCA 90, EF 40% apical and distal anterior hypokinesis. Patient declined bypass surgery. She ultimately underwent PCI of the OM1 (2.5 x 16 mm Promus DES).  Medical therapy was recommended for her LAD and RCA disease. PCI could be considered of the RCA if she failed medical therapy.  She was readmitted in 08/2013 with volume excess.  She was most recently admitted 1/4-1/7 with a non-STEMI in the setting of acute on chronic systolic and diastolic CHF. This was likely felt to be a type II non-STEMI. Her prior angiograms were reviewed and it was felt that PCI of the RCA could be considered first if she has ongoing symptoms. Referral for CABG would be a last resort. She was stable on medical therapy at discharge.  Echo (11/2011): Mild LVH, EF 35-40%, mid to distal anteroseptal, anterior and apical akinesis, moderate LAE.  She returns for follow up.  She is here with her husband. THN has been out to her house and is following up with her.  Breathing did improve after discharge from the hospital, but she has been [facility] over the past wek. She is probably NYHA class 2b-3. She denies  orthopnea2, PND or edema. She has occasional chest pains after eating. She denies any exertional chest discomfort. She denies any syncope.  She has used NTG on a couple of occasions.  CP has resolved after the NTG.  She fatigues easily.    She started Lasix 80 mg daily from 40 mg daily a few days ago.  Recent Labs: 07/04/2013: TSH 1.459  07/05/2013: HDL Cholesterol 33*; LDL (calc) 69  11/10/2013: ALT 12  11/13/2013: Hemoglobin 10.2*  11/22/2013: Creatinine 1.4*; Potassium 4.7; Pro B Natriuretic peptide (BNP) 405.0*   Wt Readings from Last 3 Encounters:  12/09/13 144 lb 12.8 oz (65.681 kg)  11/22/13 140 lb (63.504 kg)  11/13/13 143 lb 11.8 oz (65.2 kg)     Past Medical History  Diagnosis Date  . Acid reflux   . Hypertension   . Hypercholesterolemia   . Asthma   . Myocardial infarction 2014; 07/03/2013  . COPD (chronic obstructive pulmonary disease)   . Pneumonia     "as a child and again in 2013" (07/04/2013)  . Chronic bronchitis     "once or twice/yr" (07/04/2013)  . Pernicious anemia   . History of blood transfusion     "cause my blood was low" (07/04/2013)  . UVOZDGUY(403.4)     "probably weekly" (07/04/2013)  . Acoustic neuroma     "Dr. Melissa Montane is testing for this in her right ear right now" (07/04/2013)  . Deafness  in right ear   . Arthritis     "left knee; back" (07/04/2013)  . Lumbar compression fracture 2010; 2012    "fell; fell" (07/04/2013)  . Anxiety   . Chronic cystitis   . Chronic combined systolic and diastolic CHF (congestive heart failure)     Echo (11/2011): Mild LVH, EF 35-40%, mid to distal anteroseptal, anterior and apical akinesis, moderate LAE.  Marland Kitchen Coronary artery disease     non-STEMI in 06/2013. LHC (06/2013): Proximal LAD 90, mid LAD occluded, mid to distal LAD filled by left to left collaterals, lower pole of the diagonal occluded, proximal OM1 90, proximal to mid RCA 90, EF 40% apical and distal anterior hypokinesis. Patient declined bypass surgery. She  ultimately underwent PCI of the OM1 (2.5 x 16 mm Promus DES);  PCI of RCA if uncontrolled symptoms    Current Outpatient Prescriptions  Medication Sig Dispense Refill  . albuterol (PROVENTIL HFA;VENTOLIN HFA) 108 (90 BASE) MCG/ACT inhaler Inhale 2 puffs into the lungs every 6 (six) hours as needed for wheezing.      Marland Kitchen aspirin EC 81 MG tablet Take 81 mg by mouth daily.      . Cholecalciferol (VITAMIN D) 1000 UNITS capsule Take 1,000 Units by mouth daily.      . clopidogrel (PLAVIX) 75 MG tablet Take 75 mg by mouth daily with breakfast.      . Fluticasone-Salmeterol (ADVAIR) 100-50 MCG/DOSE AEPB Inhale 1 puff into the lungs every 12 (twelve) hours.      . furosemide (LASIX) 40 MG tablet Take 40 mg by mouth daily. Take 2 tabs until furthur notification.      . isosorbide mononitrate (IMDUR) 30 MG 24 hr tablet Take 30 mg by mouth 2 (two) times daily.       . metoprolol tartrate (LOPRESSOR) 25 MG tablet Take 25 mg by mouth 2 (two) times daily.      . nitroGLYCERIN (NITROSTAT) 0.4 MG SL tablet Place 0.4 mg under the tongue every 5 (five) minutes as needed.        . pantoprazole (PROTONIX) 40 MG tablet Take 40 mg by mouth daily.      . potassium chloride (KLOR-CON) 20 MEQ packet Take 20 mEq by mouth 2 (two) times daily.      . pravastatin (PRAVACHOL) 20 MG tablet Take 1 tablet (20 mg total) by mouth every evening.      . tiotropium (SPIRIVA) 18 MCG inhalation capsule Place 18 mcg into inhaler and inhale daily.       No current facility-administered medications for this visit.    Allergies:   Sulfa antibiotics   Social History:  The patient  reports that she has never smoked. She has never used smokeless tobacco. She reports that she does not drink alcohol or use illicit drugs.   Family History:  The patient's family history includes Diabetes in her mother and mother; Emphysema in her brother and father; Heart failure in her mother.   ROS:  Please see the history of present illness.   She has a  cough with white sputum production. She denies fevers, melena, hematochezia.   All other systems reviewed and negative.   PHYSICAL EXAM: VS:  BP 102/74  Pulse 62  Ht 5\' 1"  (1.549 m)  Wt 144 lb 12.8 oz (65.681 kg)  BMI 27.37 kg/m2 Well nourished, well developed, in no acute distress HEENT: normal Neck: no JVD at 90 Cardiac:  normal S1, S2; RRR; no murmur Lungs:  Decreased breath sounds  bilaterally, no wheezing, rhonchi or rales Abd: soft, nontender, no hepatomegaly Ext: no edema Skin: warm and dry Neuro:  CNs 2-12 intact, no focal abnormalities noted  EKG:  NSR, HR 68, LAD, inferior and anterolateral T wave inversions, similar to prior tracings     ASSESSMENT AND PLAN:  1. Chronic Combined Systolic and Diastolic CHF: Volume appears stable. She does report some continued dyspnea. Weights have been stable at home. I will check a followup basic metabolic panel and BNP today. If her BNP is still elevated, consider adjusting her Lasix further. We discussed the importance of weighing daily. She knows to call us if her weight increases 2-3 pounds in one day.  For now, continue increased dose of lasix (80 mg daily).  She feels her UOP has not been increased bu the lasix. 2. CAD: Symptoms are stable. She denies any exertional chest discomfort. Continue current therapy with aspirin, Plavix, nitrates, beta blocker.  I reviewed the plan with her again today which includes proceeding with PCI of the RCA at she has unstable symptoms. 3. Ischemic Cardiomyopathy: She stopped taking hydralazine secondary to symptomatic hypotension. She is not on an ACEI or ARB secondary to chronic kidney disease.  We can certainly try to re-initiate hydralazine at a lower dose over time if BP increases. Continue current dose of beta blocker, nitrates. 4. Chronic Kidney Disease: Check basic metabolic panel today. 5. Hypertension: Controlled. 6. Hyperlipidemia: Continue statin. 7. COPD: This is also likely contributing to  her chronic dyspnea. 8. Disposition: Spoke to daughter, who is a Marine scientist.: [phone].  She reports that her mother's memory is quite poor.  She had no memory of prior cath or MI when she was in the hospital.  Overall, she feels that we should continue medical therapy and invasive testing is not required.  Both the daughter and husband have a good understanding that there is no easy treatment options available. The daughter seems reconciled to the fact that the patient's memory is her biggest issue. It is hard to get history from the patient.  We'll continue conservative management.  Signed, Richardson Dopp, PA-C  12/09/2013 8:36 AM

## 2013-12-09 NOTE — Patient Instructions (Signed)
Your physician recommends that you return for lab work today for BMET and BNP.  Your physician recommends that you schedule a follow-up appointment in 1 month with Dr. Irish Lack.   Your physician recommends that you continue on your current medications as directed. Please refer to the Current Medication list given to you today.

## 2013-12-10 ENCOUNTER — Telehealth: Payer: Self-pay | Admitting: Interventional Cardiology

## 2013-12-10 ENCOUNTER — Telehealth: Payer: Self-pay | Admitting: *Deleted

## 2013-12-10 NOTE — Telephone Encounter (Signed)
Returned husbands call.

## 2013-12-10 NOTE — Telephone Encounter (Signed)
Returned daughter's call.

## 2013-12-10 NOTE — Telephone Encounter (Signed)
New Message  Pt daughter called for results// please call

## 2013-12-10 NOTE — Telephone Encounter (Signed)
Patients husband returned your call. Please call back at [phone]. Thanks , [place]

## 2013-12-11 ENCOUNTER — Other Ambulatory Visit: Payer: Self-pay | Admitting: Gastroenterology

## 2013-12-11 ENCOUNTER — Telehealth: Payer: Self-pay | Admitting: Cardiology

## 2013-12-11 DIAGNOSIS — R51 Headache: Principal | ICD-10-CM

## 2013-12-11 DIAGNOSIS — Z79899 Other long term (current) drug therapy: Secondary | ICD-10-CM

## 2013-12-11 DIAGNOSIS — R519 Headache, unspecified: Secondary | ICD-10-CM

## 2013-12-11 MED ORDER — FUROSEMIDE 40 MG PO TABS: ORAL_TABLET | ORAL | Status: DC

## 2013-12-11 NOTE — Telephone Encounter (Signed)
Message copied by Alcario Drought on Wed Dec 11, 2013  1:42 PM ------      Message from: [name]      Created: Tue Dec 10, 2013  5:07 PM       After 3 days of 80 mg BID, change to 80 mg in AM , 40 mg in afternoon.  BMet in one week. ------

## 2013-12-11 NOTE — Telephone Encounter (Signed)
See Result note 

## 2013-12-17 ENCOUNTER — Other Ambulatory Visit: Payer: Medicare HMO

## 2013-12-17 ENCOUNTER — Telehealth: Payer: Self-pay | Admitting: Interventional Cardiology

## 2013-12-17 ENCOUNTER — Encounter (HOSPITAL_COMMUNITY): Payer: Self-pay | Admitting: Emergency Medicine

## 2013-12-17 ENCOUNTER — Emergency Department (HOSPITAL_COMMUNITY): Payer: Medicare HMO

## 2013-12-17 ENCOUNTER — Other Ambulatory Visit: Payer: Self-pay

## 2013-12-17 ENCOUNTER — Inpatient Hospital Stay (HOSPITAL_COMMUNITY)
Admission: EM | Admit: 2013-12-17 | Discharge: 2013-12-20 | DRG: 280 | Disposition: A | Discharge status: Hospice | Payer: Medicare HMO | Attending: Cardiovascular Disease | Admitting: Cardiovascular Disease

## 2013-12-17 DIAGNOSIS — I5021 Acute systolic (congestive) heart failure: Secondary | ICD-10-CM

## 2013-12-17 DIAGNOSIS — Z9981 Dependence on supplemental oxygen: Secondary | ICD-10-CM

## 2013-12-17 DIAGNOSIS — Z9861 Coronary angioplasty status: Secondary | ICD-10-CM

## 2013-12-17 DIAGNOSIS — N183 Chronic kidney disease, stage 3 unspecified: Secondary | ICD-10-CM | POA: Diagnosis present

## 2013-12-17 DIAGNOSIS — R7989 Other specified abnormal findings of blood chemistry: Secondary | ICD-10-CM

## 2013-12-17 DIAGNOSIS — N189 Chronic kidney disease, unspecified: Secondary | ICD-10-CM | POA: Diagnosis present

## 2013-12-17 DIAGNOSIS — I5043 Acute on chronic combined systolic (congestive) and diastolic (congestive) heart failure: Secondary | ICD-10-CM | POA: Diagnosis present

## 2013-12-17 DIAGNOSIS — J449 Chronic obstructive pulmonary disease, unspecified: Secondary | ICD-10-CM | POA: Diagnosis present

## 2013-12-17 DIAGNOSIS — E7800 Pure hypercholesterolemia, unspecified: Secondary | ICD-10-CM | POA: Diagnosis present

## 2013-12-17 DIAGNOSIS — Z7982 Long term (current) use of aspirin: Secondary | ICD-10-CM

## 2013-12-17 DIAGNOSIS — R531 Weakness: Secondary | ICD-10-CM

## 2013-12-17 DIAGNOSIS — K219 Gastro-esophageal reflux disease without esophagitis: Secondary | ICD-10-CM | POA: Diagnosis present

## 2013-12-17 DIAGNOSIS — R0609 Other forms of dyspnea: Secondary | ICD-10-CM

## 2013-12-17 DIAGNOSIS — I129 Hypertensive chronic kidney disease with stage 1 through stage 4 chronic kidney disease, or unspecified chronic kidney disease: Secondary | ICD-10-CM | POA: Diagnosis present

## 2013-12-17 DIAGNOSIS — Z86718 Personal history of other venous thrombosis and embolism: Secondary | ICD-10-CM

## 2013-12-17 DIAGNOSIS — Z515 Encounter for palliative care: Secondary | ICD-10-CM

## 2013-12-17 DIAGNOSIS — IMO0002 Reserved for concepts with insufficient information to code with codable children: Secondary | ICD-10-CM

## 2013-12-17 DIAGNOSIS — F411 Generalized anxiety disorder: Secondary | ICD-10-CM | POA: Diagnosis present

## 2013-12-17 DIAGNOSIS — J9819 Other pulmonary collapse: Secondary | ICD-10-CM | POA: Diagnosis present

## 2013-12-17 DIAGNOSIS — Z66 Do not resuscitate: Secondary | ICD-10-CM | POA: Diagnosis not present

## 2013-12-17 DIAGNOSIS — J4489 Other specified chronic obstructive pulmonary disease: Secondary | ICD-10-CM | POA: Diagnosis present

## 2013-12-17 DIAGNOSIS — I252 Old myocardial infarction: Secondary | ICD-10-CM

## 2013-12-17 DIAGNOSIS — M129 Arthropathy, unspecified: Secondary | ICD-10-CM | POA: Diagnosis present

## 2013-12-17 DIAGNOSIS — I214 Non-ST elevation (NSTEMI) myocardial infarction: Principal | ICD-10-CM | POA: Diagnosis present

## 2013-12-17 DIAGNOSIS — H9190 Unspecified hearing loss, unspecified ear: Secondary | ICD-10-CM | POA: Diagnosis present

## 2013-12-17 DIAGNOSIS — Z9181 History of falling: Secondary | ICD-10-CM

## 2013-12-17 DIAGNOSIS — M1710 Unilateral primary osteoarthritis, unspecified knee: Secondary | ICD-10-CM | POA: Diagnosis present

## 2013-12-17 DIAGNOSIS — I2582 Chronic total occlusion of coronary artery: Secondary | ICD-10-CM | POA: Diagnosis present

## 2013-12-17 DIAGNOSIS — I2510 Atherosclerotic heart disease of native coronary artery without angina pectoris: Secondary | ICD-10-CM | POA: Diagnosis present

## 2013-12-17 DIAGNOSIS — I2589 Other forms of chronic ischemic heart disease: Secondary | ICD-10-CM | POA: Diagnosis present

## 2013-12-17 DIAGNOSIS — I509 Heart failure, unspecified: Secondary | ICD-10-CM | POA: Diagnosis present

## 2013-12-17 HISTORY — DX: Do not resuscitate: Z66

## 2013-12-17 LAB — COMPREHENSIVE METABOLIC PANEL
ALT: 11 U/L (ref 0–35)
AST: 24 U/L (ref 0–37)
Albumin: 3.7 g/dL (ref 3.5–5.2)
Alkaline Phosphatase: 88 U/L (ref 39–117)
BILIRUBIN TOTAL: 0.5 mg/dL (ref 0.3–1.2)
BUN: 36 mg/dL — AB (ref 6–23)
CHLORIDE: 96 meq/L (ref 96–112)
CO2: 26 mEq/L (ref 19–32)
Calcium: 9.9 mg/dL (ref 8.4–10.5)
Creatinine, Ser: 1.29 mg/dL — ABNORMAL HIGH (ref 0.50–1.10)
GFR, EST AFRICAN AMERICAN: 43 mL/min — AB (ref >90)
GFR, EST NON AFRICAN AMERICAN: 37 mL/min — AB (ref >90)
GLUCOSE: 116 mg/dL — AB (ref 70–99)
Potassium: 4.6 mEq/L (ref 3.7–5.3)
Sodium: 137 mEq/L (ref 137–147)
Total Protein: 7 g/dL (ref 6.0–8.3)

## 2013-12-17 LAB — CBC
HEMATOCRIT: 35.1 % — AB (ref 36.0–46.0)
Hemoglobin: 10.8 g/dL — ABNORMAL LOW (ref 12.0–15.0)
MCH: 25.9 pg — AB (ref 26.0–34.0)
MCHC: 30.8 g/dL (ref 30.0–36.0)
MCV: 84.2 fL (ref 78.0–100.0)
Platelets: 208 10*3/uL (ref 150–400)
RBC: 4.17 MIL/uL (ref 3.87–5.11)
RDW: 14.9 % (ref 11.5–15.5)
WBC: 7.3 10*3/uL (ref 4.0–10.5)

## 2013-12-17 LAB — URINALYSIS, ROUTINE W REFLEX MICROSCOPIC
Bilirubin Urine: NEGATIVE
Glucose, UA: NEGATIVE mg/dL
HGB URINE DIPSTICK: NEGATIVE
Ketones, ur: NEGATIVE mg/dL
NITRITE: POSITIVE — AB
Protein, ur: NEGATIVE mg/dL
Specific Gravity, Urine: 1.009 (ref 1.005–1.030)
UROBILINOGEN UA: 0.2 mg/dL (ref 0.0–1.0)
pH: 7.5 (ref 5.0–8.0)

## 2013-12-17 LAB — POCT I-STAT TROPONIN I: Troponin i, poc: 1.05 ng/mL (ref 0.00–0.08)

## 2013-12-17 LAB — URINE MICROSCOPIC-ADD ON

## 2013-12-17 LAB — MRSA PCR SCREENING: MRSA BY PCR: NEGATIVE

## 2013-12-17 LAB — TROPONIN I
TROPONIN I: 0.69 ng/mL — AB (ref <0.30)
Troponin I: 0.72 ng/mL (ref <0.30)

## 2013-12-17 LAB — PRO B NATRIURETIC PEPTIDE: PRO B NATRI PEPTIDE: 9428 pg/mL — AB (ref 0–450)

## 2013-12-17 MED ORDER — ASPIRIN 81 MG PO CHEW: 81.0000 mg | CHEWABLE_TABLET | ORAL | Status: DC

## 2013-12-17 MED ORDER — FUROSEMIDE 20 MG PO TABS
40.0000 mg | ORAL_TABLET | Freq: Every day | ORAL | Status: DC
  Filled 2013-12-17: qty 4

## 2013-12-17 MED ORDER — FUROSEMIDE 20 MG PO TABS: 40.0000 mg | ORAL_TABLET | ORAL | Status: DC

## 2013-12-17 MED ORDER — PANTOPRAZOLE SODIUM 40 MG PO TBEC
40.0000 mg | DELAYED_RELEASE_TABLET | Freq: Every day | ORAL | Status: DC
  Administered 2013-12-18 – 2013-12-20 (×3): 40 mg via ORAL
  Filled 2013-12-17 (×4): qty 1

## 2013-12-17 MED ORDER — POTASSIUM CHLORIDE CRYS ER 20 MEQ PO TBCR
20.0000 meq | EXTENDED_RELEASE_TABLET | Freq: Two times a day (BID) | ORAL | Status: DC
  Administered 2013-12-17 – 2013-12-20 (×6): 20 meq via ORAL
  Filled 2013-12-17 (×7): qty 1

## 2013-12-17 MED ORDER — SODIUM CHLORIDE 0.9 % IV SOLN
INTRAVENOUS | Status: DC
  Administered 2013-12-18: 06:00:00 via INTRAVENOUS

## 2013-12-17 MED ORDER — SODIUM CHLORIDE 0.9 % IJ SOLN: 3.0000 mL | INTRAMUSCULAR | Status: DC | PRN

## 2013-12-17 MED ORDER — TIOTROPIUM BROMIDE MONOHYDRATE 18 MCG IN CAPS
18.0000 ug | ORAL_CAPSULE | Freq: Every day | RESPIRATORY_TRACT | Status: DC
  Filled 2013-12-17 (×2): qty 5

## 2013-12-17 MED ORDER — ASPIRIN EC 81 MG PO TBEC: 81.0000 mg | DELAYED_RELEASE_TABLET | Freq: Every day | ORAL | Status: DC

## 2013-12-17 MED ORDER — ALBUTEROL SULFATE (2.5 MG/3ML) 0.083% IN NEBU: 2.5000 mg | INHALATION_SOLUTION | Freq: Four times a day (QID) | RESPIRATORY_TRACT | Status: DC | PRN

## 2013-12-17 MED ORDER — ISOSORBIDE MONONITRATE ER 30 MG PO TB24
30.0000 mg | ORAL_TABLET | Freq: Two times a day (BID) | ORAL | Status: DC
  Administered 2013-12-17 – 2013-12-20 (×6): 30 mg via ORAL
  Filled 2013-12-17 (×8): qty 1

## 2013-12-17 MED ORDER — ONDANSETRON HCL 4 MG/2ML IJ SOLN: 4.0000 mg | Freq: Four times a day (QID) | INTRAMUSCULAR | Status: DC | PRN

## 2013-12-17 MED ORDER — ASPIRIN EC 81 MG PO TBEC
81.0000 mg | DELAYED_RELEASE_TABLET | Freq: Every day | ORAL | Status: DC
  Administered 2013-12-18 – 2013-12-20 (×3): 81 mg via ORAL
  Filled 2013-12-17 (×3): qty 1

## 2013-12-17 MED ORDER — SODIUM CHLORIDE 0.9 % IJ SOLN
3.0000 mL | Freq: Two times a day (BID) | INTRAMUSCULAR | Status: DC
  Administered 2013-12-18: 3 mL via INTRAVENOUS

## 2013-12-17 MED ORDER — ACETAMINOPHEN 325 MG PO TABS
650.0000 mg | ORAL_TABLET | ORAL | Status: DC | PRN
  Administered 2013-12-17 – 2013-12-19 (×5): 650 mg via ORAL
  Filled 2013-12-17 (×5): qty 2

## 2013-12-17 MED ORDER — METOPROLOL TARTRATE 25 MG PO TABS
25.0000 mg | ORAL_TABLET | Freq: Two times a day (BID) | ORAL | Status: DC
  Administered 2013-12-17 – 2013-12-20 (×6): 25 mg via ORAL
  Filled 2013-12-17 (×8): qty 1

## 2013-12-17 MED ORDER — SIMVASTATIN 10 MG PO TABS
10.0000 mg | ORAL_TABLET | Freq: Every day | ORAL | Status: DC
  Administered 2013-12-17 – 2013-12-19 (×3): 10 mg via ORAL
  Filled 2013-12-17 (×5): qty 1

## 2013-12-17 MED ORDER — ALBUTEROL SULFATE HFA 108 (90 BASE) MCG/ACT IN AERS: 2.0000 | INHALATION_SPRAY | Freq: Four times a day (QID) | RESPIRATORY_TRACT | Status: DC | PRN

## 2013-12-17 MED ORDER — NITROGLYCERIN 0.4 MG SL SUBL: 0.4000 mg | SUBLINGUAL_TABLET | SUBLINGUAL | Status: DC | PRN

## 2013-12-17 MED ORDER — FUROSEMIDE 10 MG/ML IJ SOLN
40.0000 mg | Freq: Once | INTRAMUSCULAR | Status: AC
  Administered 2013-12-17: 40 mg via INTRAVENOUS
  Filled 2013-12-17: qty 4

## 2013-12-17 MED ORDER — HEPARIN (PORCINE) IN NACL 100-0.45 UNIT/ML-% IJ SOLN
800.0000 [IU]/h | INTRAMUSCULAR | Status: DC
  Administered 2013-12-17: 550 [IU]/h via INTRAVENOUS
  Administered 2013-12-18 (×2): 800 [IU]/h via INTRAVENOUS
  Filled 2013-12-17 (×3): qty 250

## 2013-12-17 MED ORDER — POTASSIUM CHLORIDE 20 MEQ PO PACK: 20.0000 meq | PACK | Freq: Two times a day (BID) | ORAL | Status: DC

## 2013-12-17 MED ORDER — SODIUM CHLORIDE 0.9 % IV BOLUS (SEPSIS)
500.0000 mL | Freq: Once | INTRAVENOUS | Status: AC
  Administered 2013-12-17: 500 mL via INTRAVENOUS

## 2013-12-17 MED ORDER — SODIUM CHLORIDE 0.9 % IV SOLN: INTRAVENOUS | Status: DC

## 2013-12-17 MED ORDER — HEPARIN BOLUS VIA INFUSION
3000.0000 [IU] | Freq: Once | INTRAVENOUS | Status: AC
  Administered 2013-12-17: 3000 [IU] via INTRAVENOUS
  Filled 2013-12-17: qty 3000

## 2013-12-17 MED ORDER — ISOSORBIDE MONONITRATE ER 30 MG PO TB24: 30.0000 mg | ORAL_TABLET | Freq: Two times a day (BID) | ORAL | Status: DC

## 2013-12-17 MED ORDER — ACETAMINOPHEN 325 MG PO TABS: 325.0000 mg | ORAL_TABLET | Freq: Two times a day (BID) | ORAL | Status: DC | PRN

## 2013-12-17 MED ORDER — SODIUM CHLORIDE 0.9 % IV SOLN: 250.0000 mL | INTRAVENOUS | Status: DC | PRN

## 2013-12-17 NOTE — Telephone Encounter (Signed)
Spoke with pts daughter and her mother's SOB has worsened a lot since last week. Pt is having to use her oxygen. Per Dr. Irish Lack pt should go to [facility] ED and pts daughter agrees. Pts husband is en route taking her there as we speak.

## 2013-12-17 NOTE — ED Notes (Signed)
Diet tray ordered 

## 2013-12-17 NOTE — Telephone Encounter (Signed)
New message     Pt completed 80mg  (40mg  am and 40mg  mid day) lasix last Friday.  Since then she has gained 4lbs and is sob and fatigued. Sat, sun and mon she got an extra 20mg  tablet---was supposed to have had 40mg  daily but got an extra 20mg . Pt is supposed to have lab drawn today----do you think Dr Clayton Bibles may want to see her?

## 2013-12-17 NOTE — Telephone Encounter (Signed)
Spoke with pts daughter and after the 3 days of 80mg  BID pt went back 80 mg in the am and 40 mg in the pm as directed by Dr. Irish Lack. Pts weight is still up 4 lbs since Saturday. Pt has Bmet today. Please advise after Bmet reviewed.

## 2013-12-17 NOTE — H&P (Signed)
Chief Complaint: SOB  HPI: [name] is a 80 y.o. Female, followed by Dr. Irish Lack, with a history of CAD, ischemic cardiomyopathy, combined systolic and diastolic CHF, HTN, COPD, HL, CKD, prior DVT (not on Coumadin). Echo in 02/2013 demonstrated an EF of 40-50%. She has been hospitalized several times over the last several months. She suffered a non-STEMI in 06/2013. LHC (06/2013): Proximal LAD 90, mid LAD occluded, mid to distal LAD filled by left to left collaterals, lower pole of the diagonal occluded, proximal OM1 90, proximal to mid RCA 90, EF 40% apical and distal anterior hypokinesis. Patient declined bypass surgery. She ultimately underwent PCI of the OM1 (2.5 x 16 mm Promus DES). Medical therapy was recommended for her LAD and RCA disease.    She was most recently admitted from 1/4-1/7 with a non-STEMI in the setting of acute on chronic systolic and diastolic CHF. This was likely felt to be a type II non-STEMI. Her prior angiograms were reviewed and it was felt that PCI of the RCA could be considered first if she had ongoing symptoms. Referral for CABG would be a last resort. She was stable on medical therapy at discharge. Echo (11/2011): Mild LVH, EF 35-40%, mid to distal anteroseptal, anterior and apical akinesis, moderate LAE.  She presents back to the [facility] ER today with a complaint of SOB for the last week. She denies any chest pain. Both her husband and daughter, who are both by her bedside, state that SOB seems to be her anginal equivalent. She reports daily compliance with her medications and adherence to a low sodium diet. She was instructed by Dr. Irish Lack, over the phone, to increase her Lasix dose to 80 mg, but she has had no improvement in symptoms. She also had to increase use of her home O2. She normally uses 2L only at night for COPD, but has had to use it during the day as well.    Past Medical History  Diagnosis Date  . Acid reflux   . Hypertension   .  Hypercholesterolemia   . Asthma   . Myocardial infarction 2014; 07/03/2013  . COPD (chronic obstructive pulmonary disease)   . Pneumonia     "as a child and again in 2013" (07/04/2013)  . Chronic bronchitis     "once or twice/yr" (07/04/2013)  . Pernicious anemia   . History of blood transfusion     "cause my blood was low" (07/04/2013)  . CHENIDPO(242.3)     "probably weekly" (07/04/2013)  . Acoustic neuroma     "Dr. Melissa Montane is testing for this in her right ear right now" (07/04/2013)  . Deafness in right ear   . Arthritis     "left knee; back" (07/04/2013)  . Lumbar compression fracture 2010; 2012    "fell; fell" (07/04/2013)  . Anxiety   . Chronic cystitis   . Chronic combined systolic and diastolic CHF (congestive heart failure)     Echo (11/2011): Mild LVH, EF 35-40%, mid to distal anteroseptal, anterior and apical akinesis, moderate LAE.  Marland Kitchen Coronary artery disease     non-STEMI in 06/2013. LHC (06/2013): Proximal LAD 90, mid LAD occluded, mid to distal LAD filled by left to left collaterals, lower pole of the diagonal occluded, proximal OM1 90, proximal to mid RCA 90, EF 40% apical and distal anterior hypokinesis. Patient declined bypass surgery. She ultimately underwent PCI of the OM1 (2.5 x 16 mm Promus DES);  PCI of RCA if uncontrolled symptoms  Past Surgical History  Procedure Laterality Date  . Abdominal surgery  1960's    "tumor removed" (07/04/2013)  . Inguinal hernia repair Right   . Vesicovaginal fistula closure w/ tah    . Breast biopsy Left 1980's  . Knee arthroscopy Left 1980's  . Cataract extraction, bilateral Bilateral ~ 2011  . Tonsillectomy  1930's  . Cholecystectomy    . Appendectomy    . Bladder suspension  05/16/2005    Archie Endo 05/16/2005 (07/04/2013)  . Femoral-popliteal bypass graft Left 1990's  . Cardiac catheterization  2012  . Coronary angioplasty with stent placement  07/10/2013    "1" (07/10/2013)    Family History  Problem Relation Age of Onset  .  Emphysema Brother     2 brothers  . Emphysema Father   . Heart failure Mother   . Diabetes Mother   . Diabetes Mother    Social History:  reports that she has never smoked. She has never used smokeless tobacco. She reports that she does not drink alcohol or use illicit drugs.  Allergies:  Allergies  Allergen Reactions  . Sulfa Antibiotics Other (See Comments)    Breaking out    Prior to Admission medications   Medication Sig Start Date End Date Taking? Authorizing Provider  acetaminophen (TYLENOL) 325 MG tablet Take 325 mg by mouth 2 (two) times daily as needed for headache (pain).   Yes Historical Provider, MD  albuterol (PROVENTIL HFA;VENTOLIN HFA) 108 (90 BASE) MCG/ACT inhaler Inhale 2 puffs into the lungs every 6 (six) hours as needed for wheezing.   Yes Historical Provider, MD  aspirin EC 81 MG tablet Take 81 mg by mouth daily.   Yes Historical Provider, MD  Cholecalciferol (VITAMIN D) 1000 UNITS capsule Take 1,000 Units by mouth daily.   Yes Historical Provider, MD  clopidogrel (PLAVIX) 75 MG tablet Take 75 mg by mouth daily with breakfast.   Yes Historical Provider, MD  Cyanocobalamin (VITAMIN B-12 IJ) Inject 1 mL as directed every 3 (three) months. Last dose 12/10/13   Yes Historical Provider, MD  furosemide (LASIX) 40 MG tablet Take 40-80 mg by mouth See admin instructions. Usual dose: 40 mg twice daily. On 2/4 dose increased to 80 mg twice daily for 3 days. Then on 2/7 patient began taking 80 mg in the morning, then 40 mg in the evening.   Yes Historical Provider, MD  isosorbide mononitrate (IMDUR) 30 MG 24 hr tablet Take 30 mg by mouth 2 (two) times daily.   Yes Historical Provider, MD  metoprolol tartrate (LOPRESSOR) 25 MG tablet Take 25 mg by mouth 2 (two) times daily.   Yes Historical Provider, MD  nitroGLYCERIN (NITROSTAT) 0.4 MG SL tablet Place 0.4 mg under the tongue every 5 (five) minutes as needed.     Yes Historical Provider, MD  OVER THE COUNTER MEDICATION Take 1  capsule by mouth daily. Peppermint Capsule   Yes Historical Provider, MD  pantoprazole (PROTONIX) 40 MG tablet Take 40 mg by mouth daily.   Yes Historical Provider, MD  potassium chloride (KLOR-CON) 20 MEQ packet Take 20 mEq by mouth 2 (two) times daily.   Yes Historical Provider, MD  pravastatin (PRAVACHOL) 20 MG tablet Take 20 mg by mouth every evening.   Yes Historical Provider, MD  Simethicone LIQD Take 10 mLs by mouth daily as needed (gas).    Yes Historical Provider, MD  tiotropium (SPIRIVA) 18 MCG inhalation capsule Place 18 mcg into inhaler and inhale daily.   Yes Historical Provider,  MD     Results for orders placed during the hospital encounter of 12/17/13 (from the past 48 hour(s))  CBC     Status: Abnormal   Collection Time    12/17/13  1:30 PM      Result Value Range   WBC 7.3  4.0 - 10.5 K/uL   RBC 4.17  3.87 - 5.11 MIL/uL   Hemoglobin 10.8 (*) 12.0 - 15.0 g/dL   HCT 35.1 (*) 36.0 - 46.0 %   MCV 84.2  78.0 - 100.0 fL   MCH 25.9 (*) 26.0 - 34.0 pg   MCHC 30.8  30.0 - 36.0 g/dL   RDW 14.9  11.5 - 15.5 %   Platelets 208  150 - 400 K/uL  COMPREHENSIVE METABOLIC PANEL     Status: Abnormal   Collection Time    12/17/13  1:30 PM      Result Value Range   Sodium 137  137 - 147 mEq/L   Potassium 4.6  3.7 - 5.3 mEq/L   Chloride 96  96 - 112 mEq/L   CO2 26  19 - 32 mEq/L   Glucose, Bld 116 (*) 70 - 99 mg/dL   BUN 36 (*) 6 - 23 mg/dL   Creatinine, Ser 1.29 (*) 0.50 - 1.10 mg/dL   Calcium 9.9  8.4 - 10.5 mg/dL   Total Protein 7.0  6.0 - 8.3 g/dL   Albumin 3.7  3.5 - 5.2 g/dL   AST 24  0 - 37 U/L   ALT 11  0 - 35 U/L   Alkaline Phosphatase 88  39 - 117 U/L   Total Bilirubin 0.5  0.3 - 1.2 mg/dL   GFR calc non Af Amer 37 (*) >90 mL/min   GFR calc Af Amer 43 (*) >90 mL/min   Comment: (NOTE)     The eGFR has been calculated using the CKD EPI equation.     This calculation has not been validated in all clinical situations.     eGFR's persistently <90 mL/min signify possible  Chronic Kidney     Disease.  PRO B NATRIURETIC PEPTIDE     Status: Abnormal   Collection Time    12/17/13  1:30 PM      Result Value Range   Pro B Natriuretic peptide (BNP) 9428.0 (*) 0 - 450 pg/mL  POCT I-STAT TROPONIN I     Status: Abnormal   Collection Time    12/17/13  1:42 PM      Result Value Range   Troponin i, poc 1.05 (*) 0.00 - 0.08 ng/mL   Comment NOTIFIED PHYSICIAN     Comment 3            Comment: Due to the release kinetics of cTnI,     a negative result within the first hours     of the onset of symptoms does not rule out     myocardial infarction with certainty.     If myocardial infarction is still suspected,     repeat the test at appropriate intervals.   Dg Chest 2 View  12/17/2013   CLINICAL DATA:  Shortness of breath, CHF  EXAM: CHEST  2 VIEW  COMPARISON:  Prior radiograph from 11/10/2013  FINDINGS: Cardiomegaly is stable as compared to the prior exam.  Lungs are mildly hypoinflated. There is mild diffuse pulmonary vascular congestion with interstitial prominence, suggestive of mild acute CHF exacerbation. Bilateral pleural effusions are present. Associated bibasilar opacities most likely reflect atelectasis. No  definite focal infiltrate identified. No pneumothorax.  Osseous structures are unchanged.  IMPRESSION: Stable cardiomegaly with mild diffuse pulmonary vascular congestion and small bilateral pleural effusions, suggestive of acute CHF exacerbation.   Electronically Signed   By: Jeannine Boga M.D.   On: 12/17/2013 13:17    Review of Systems  Respiratory: Positive for shortness of breath.   Cardiovascular: Negative for chest pain and leg swelling.  Gastrointestinal: Negative for nausea and vomiting.  Neurological: Positive for dizziness and loss of consciousness.  All other systems reviewed and are negative.    Blood pressure 119/81, pulse 87, temperature 97.7 F (36.5 C), temperature source Oral, resp. rate 23, height 5' 1"  (1.549 m), weight 144 lb  (65.318 kg), SpO2 95.00%. Physical Exam  Constitutional: She is oriented to person, place, and time. She appears well-developed and well-nourished. No distress.  Neck: JVD present. Carotid bruit is not present.  Cardiovascular: Normal rate, regular rhythm, normal heart sounds and intact distal pulses.  Exam reveals no gallop and no friction rub.   No murmur heard. Pulses:      Radial pulses are 2+ on the right side, and 2+ on the left side.       Dorsalis pedis pulses are 2+ on the right side, and 2+ on the left side.  Respiratory: Effort normal. No respiratory distress. She has no wheezes. She has rales.  Musculoskeletal: She exhibits no edema.  Neurological: She is alert and oriented to person, place, and time.  Skin: Skin is warm and dry. She is not diaphoretic.  Psychiatric: She has a normal mood and affect. Her behavior is normal.     Assessment/Plan Active Problems:   Coronary artery disease   NSTEMI (non-ST elevated myocardial infarction)   Plan: The patient is a 80 y/o female, with known CAD, who presents with SOB (anginal equivalent). She denies CP. POC troponin in ER is elevated at 1.05. EKG shows TWIs in multiple leads, that appear to be more prominent compared to prior study. IV heparin has been initiated. She has known LAD and RCA disease. Recent cath in Aug. 2014 revealed proximal 90% stenosis in LAD, with the proximal to mid LAD occluded, mid to distal LAD was filled by left to left collaterals. The proximal to mid RCA was 90% occluded. She declined bypass surgery. She ended up undergoing PCI to the OM1. It was decided to treat her LAD and RCA with medical therapy. She was seen recently by Dr. Irish Lack, her primary cardiologist. He noted that if she continued to have symptoms, to pursue PCI on the RCA. With positive troponin, EKG changes and recent symptoms, we will admit and plan for a LHC tomorrow. Will make NPO at midnight and will continue IV heparin.  BNP is also elevated at  9428. CXR demonstrates stable cardiomegaly with mild diffuse pulmonary vascular congestion and small bilateral pleural effusions, suggestive of acute CHF exacerbation. She will require IV diuretic therapy. Will continue ASA, BB, Imdur and statin. MD to follow.    Lyda Jester 12/17/2013, 4:33 PM  Attending Note:   The patient was seen and examined.  Agree with assessment and plan as noted above.  Changes made to the above note as needed.  I have met Ms. Endicott previously when she was here in Jan.  She has a hx of severe 3 V CAD .  She had PCI of her left circumflex artery during that admission. Her right coronary artery is severely and diffusely diseased.  She's been seen by Dr.Varanasi  with some episodes of chest discomfort. He saw her last week and thought that she may need PCI of right coronary artery.  She now presents with two-week history of progressive shortness of breath and chest discomfort. Her symptoms are similar to her previous episodes. In ER she's not have some mild EKG abnormalities. Her troponin levels are already elevated.  Long discussion with the patient and her daughter. They understand that PCI is risky and that we may run out of options but at this point it's all that we have to offer. The patient still does not want to have coronary artery bypass grafting and I agree that it would be a very tough procedure for her to get through.  We'll put her on for left heart catheterization with possible PCI tomorrow. The case is at 3:30. She'll get clear liquids for breakfast and then we'll hold her lunch. We'll continue her on heparin.   She has lots of course rales on exam. I suspect she has some degree of atelectasis. We'll try to gently diurese her.  I've encouraged her to take deep breaths or irregular basis.  Thayer Headings, Brooke Bonito., MD, [facility] 12/17/2013, 5:06 PM

## 2013-12-17 NOTE — ED Provider Notes (Signed)
CSN: [id number]     Arrival date & time 12/17/13  1241 History   First MD Initiated Contact with Patient 12/17/13 1333     Chief Complaint  Patient presents with  . Shortness of Breath      HPI  Patient presents with dyspnea, chest pain.  Symptoms began several days ago, though became more pronounced over the past day. The patient notes pain is more pronounced with activity, dyspnea and fatigue are also more pronounced with activity. Patient recently took increased dose of Lasix for one week. During this time she notes decreased urine output, and increasing the aforementioned symptoms. Patient has no syncope, no vomiting, no diarrhea. Patient has a notable history of CAD, CHF, COPD. Patient saw her cardiologist last week, and started on the Lasix dosing regimen. Pain is anterior, tight, nonradiating.   Past Medical History  Diagnosis Date  . Acid reflux   . Hypertension   . Hypercholesterolemia   . Asthma   . Myocardial infarction 2014; 07/03/2013  . COPD (chronic obstructive pulmonary disease)   . Pneumonia     "as a child and again in 2013" (07/04/2013)  . Chronic bronchitis     "once or twice/yr" (07/04/2013)  . Pernicious anemia   . History of blood transfusion     "cause my blood was low" (07/04/2013)  . WFUXNATF(573.2)     "probably weekly" (07/04/2013)  . Acoustic neuroma     "Dr. Melissa Montane is testing for this in her right ear right now" (07/04/2013)  . Deafness in right ear   . Arthritis     "left knee; back" (07/04/2013)  . Lumbar compression fracture 2010; 2012    "fell; fell" (07/04/2013)  . Anxiety   . Chronic cystitis   . Chronic combined systolic and diastolic CHF (congestive heart failure)     Echo (11/2011): Mild LVH, EF 35-40%, mid to distal anteroseptal, anterior and apical akinesis, moderate LAE.  Marland Kitchen Coronary artery disease     non-STEMI in 06/2013. LHC (06/2013): Proximal LAD 90, mid LAD occluded, mid to distal LAD filled by left to left collaterals, lower  pole of the diagonal occluded, proximal OM1 90, proximal to mid RCA 90, EF 40% apical and distal anterior hypokinesis. Patient declined bypass surgery. She ultimately underwent PCI of the OM1 (2.5 x 16 mm Promus DES);  PCI of RCA if uncontrolled symptoms   Past Surgical History  Procedure Laterality Date  . Abdominal surgery  1960's    "tumor removed" (07/04/2013)  . Inguinal hernia repair Right   . Vesicovaginal fistula closure w/ tah    . Breast biopsy Left 1980's  . Knee arthroscopy Left 1980's  . Cataract extraction, bilateral Bilateral ~ 2011  . Tonsillectomy  1930's  . Cholecystectomy    . Appendectomy    . Bladder suspension  05/16/2005    Archie Endo 05/16/2005 (07/04/2013)  . Femoral-popliteal bypass graft Left 1990's  . Cardiac catheterization  2012  . Coronary angioplasty with stent placement  07/10/2013    "1" (07/10/2013)   Family History  Problem Relation Age of Onset  . Emphysema Brother     2 brothers  . Emphysema Father   . Heart failure Mother   . Diabetes Mother   . Diabetes Mother    History  Substance Use Topics  . Smoking status: Never Smoker   . Smokeless tobacco: Never Used  . Alcohol Use: No   OB History   Grav Para Term Preterm Abortions TAB SAB Ect Mult  Living                 Review of Systems  Constitutional:       Per HPI, otherwise negative  HENT:       Per HPI, otherwise negative  Respiratory:       Per HPI, otherwise negative  Cardiovascular:       Per HPI, otherwise negative  Gastrointestinal: Negative for vomiting.  Endocrine:       Negative aside from HPI  Genitourinary:       Neg aside from HPI   Musculoskeletal:       Per HPI, otherwise negative  Skin: Negative.   Neurological: Negative for syncope.      Allergies  Sulfa antibiotics  Home Medications   Current Outpatient Rx  Name  Route  Sig  Dispense  Refill  . acetaminophen (TYLENOL) 325 MG tablet   Oral   Take 325 mg by mouth 2 (two) times daily as needed for headache  (pain).         Marland Kitchen albuterol (PROVENTIL HFA;VENTOLIN HFA) 108 (90 BASE) MCG/ACT inhaler   Inhalation   Inhale 2 puffs into the lungs every 6 (six) hours as needed for wheezing.         Marland Kitchen aspirin EC 81 MG tablet   Oral   Take 81 mg by mouth daily.         . Cholecalciferol (VITAMIN D) 1000 UNITS capsule   Oral   Take 1,000 Units by mouth daily.         . clopidogrel (PLAVIX) 75 MG tablet   Oral   Take 75 mg by mouth daily with breakfast.         . Cyanocobalamin (VITAMIN B-12 IJ)   Injection   Inject 1 mL as directed every 3 (three) months. Last dose 12/10/13         . furosemide (LASIX) 40 MG tablet   Oral   Take 40-80 mg by mouth See admin instructions. Usual dose: 40 mg twice daily. On 2/4 dose increased to 80 mg twice daily for 3 days. Then on 2/7 patient began taking 80 mg in the morning, then 40 mg in the evening.         . isosorbide mononitrate (IMDUR) 30 MG 24 hr tablet   Oral   Take 30 mg by mouth 2 (two) times daily.         . metoprolol tartrate (LOPRESSOR) 25 MG tablet   Oral   Take 25 mg by mouth 2 (two) times daily.         . nitroGLYCERIN (NITROSTAT) 0.4 MG SL tablet   Sublingual   Place 0.4 mg under the tongue every 5 (five) minutes as needed.           Marland Kitchen OVER THE COUNTER MEDICATION   Oral   Take 1 capsule by mouth daily. Peppermint Capsule         . pantoprazole (PROTONIX) 40 MG tablet   Oral   Take 40 mg by mouth daily.         . potassium chloride (KLOR-CON) 20 MEQ packet   Oral   Take 20 mEq by mouth 2 (two) times daily.         . pravastatin (PRAVACHOL) 20 MG tablet   Oral   Take 20 mg by mouth every evening.         . Simethicone LIQD   Oral   Take 10 mLs by  mouth daily as needed (gas).          Marland Kitchen tiotropium (SPIRIVA) 18 MCG inhalation capsule   Inhalation   Place 18 mcg into inhaler and inhale daily.          BP 119/81  Pulse 87  Temp(Src) 97.7 F (36.5 C) (Oral)  Resp 23  Ht 5\' 1"  (1.549 m)  Wt  144 lb (65.318 kg)  BMI 27.22 kg/m2  SpO2 95% Physical Exam  Nursing note and vitals reviewed. Constitutional: She is oriented to person, place, and time. She appears well-developed and well-nourished. No distress.  HENT:  Head: Normocephalic and atraumatic.  Eyes: Conjunctivae and EOM are normal.  Cardiovascular: Normal rate, regular rhythm and intact distal pulses.   Murmur heard. Pulmonary/Chest: No stridor. Tachypnea noted. No respiratory distress. She has decreased breath sounds. She has no wheezes.  Abdominal: She exhibits no distension.  Musculoskeletal: She exhibits no edema.  Neurological: She is alert and oriented to person, place, and time. No cranial nerve deficit.  Skin: Skin is warm and dry.  Psychiatric: She has a normal mood and affect.    ED Course  Procedures (including critical care time) Labs Review Labs Reviewed  CBC - Abnormal; Notable for the following:    Hemoglobin 10.8 (*)    HCT 35.1 (*)    MCH 25.9 (*)    All other components within normal limits  COMPREHENSIVE METABOLIC PANEL - Abnormal; Notable for the following:    Glucose, Bld 116 (*)    BUN 36 (*)    Creatinine, Ser 1.29 (*)    GFR calc non Af Amer 37 (*)    GFR calc Af Amer 43 (*)    All other components within normal limits  PRO B NATRIURETIC PEPTIDE - Abnormal; Notable for the following:    Pro B Natriuretic peptide (BNP) 9428.0 (*)    All other components within normal limits  POCT I-STAT TROPONIN I - Abnormal; Notable for the following:    Troponin i, poc 1.05 (*)    All other components within normal limits  URINALYSIS, ROUTINE W REFLEX MICROSCOPIC  HEPARIN LEVEL (UNFRACTIONATED)   Imaging Review Dg Chest 2 View  12/17/2013   CLINICAL DATA:  Shortness of breath, CHF  EXAM: CHEST  2 VIEW  COMPARISON:  Prior radiograph from 11/10/2013  FINDINGS: Cardiomegaly is stable as compared to the prior exam.  Lungs are mildly hypoinflated. There is mild diffuse pulmonary vascular congestion  with interstitial prominence, suggestive of mild acute CHF exacerbation. Bilateral pleural effusions are present. Associated bibasilar opacities most likely reflect atelectasis. No definite focal infiltrate identified. No pneumothorax.  Osseous structures are unchanged.  IMPRESSION: Stable cardiomegaly with mild diffuse pulmonary vascular congestion and small bilateral pleural effusions, suggestive of acute CHF exacerbation.   Electronically Signed   By: Jeannine Boga M.D.   On: 12/17/2013 13:17    EKG Interpretation    Date/Time:  Tuesday December 17 2013 12:46:23 EST Ventricular Rate:  84 PR Interval:  192 QRS Duration: 98 QT Interval:  418 QTC Calculation: 493 R Axis:   -12 Text Interpretation:  Normal sinus rhythm Low voltage QRS ST \\T \ T wave abnormality, consider inferior ischemia ST \\T \ T wave abnormality, consider anterolateral ischemia Prolonged QT Abnormal ECG Sinus rhythm ST-t wave abnormality new since last tracing Abnormal ekg Confirmed by Carmin Muskrat  MD [phone]) on 12/17/2013 3:31:58 PM           after the initial evaluation, patient's initial labs demonstrated  elevated troponin.  With her chest pain, no EKG changes, heparin was started.  Patient has no extremity edema, lung sounds are relatively clear, so she will receive fluids for her hypotension as well.  Last ECHO w diminished EF Last Cath w stent placed and multiple areas of concern identified - not amenable to cath, and the patient was not a candidate for CABG  With the patient's recent cardiology visit, her history of cardiac cath last year with recent stenting, and her new elevated troponin I discussed her care with her cardiologist, will assist with management of the patient.  3:34 PM Patient's BP has improved.   No new complaints.  With elevated BNP, though with mild hypertension, fluid resuscitation will be performed gradually  MDM   Patient presents with dyspnea, chest pain.  Patient has a CAD,  CHF, and her symptoms have progressed in spite of taking double dose of Lasix for several days. On exam patient is awake and alert, though hypotensive initially.  With no gross evidence of fluid overload, patient received IV fluids, in addition to heparin given her elevated troponin.  Patient's pain improved, but with abnormal labs, CP, and known pathology, she required admission for further E/M given concern for ongoing ischemia and e/o worsening CHF.  CRITICAL CARE Performed by: Carmin Muskrat Total critical care time: 40 Critical care time was exclusive of separately billable procedures and treating other patients. Critical care was necessary to treat or prevent imminent or life-threatening deterioration. Critical care was time spent personally by me on the following activities: development of treatment plan with patient and/or surrogate as well as nursing, discussions with consultants, evaluation of patient's response to treatment, examination of patient, obtaining history from patient or surrogate, ordering and performing treatments and interventions, ordering and review of laboratory studies, ordering and review of radiographic studies, pulse oximetry and re-evaluation of patient's condition.   Carmin Muskrat, MD 12/17/13 [phone]

## 2013-12-17 NOTE — ED Notes (Signed)
Has been on high doses of lasix for the past 6 days--80mg  bid x 3 days, 80 mg q am x3 days, 40 mg x 3 days q pm

## 2013-12-17 NOTE — ED Notes (Addendum)
Sob and feeling bad since yesterday occ has cp when she breathes has hx of chf

## 2013-12-17 NOTE — ED Notes (Signed)
Results of troponin given to Dr. Vanita Panda and told to primary nurse

## 2013-12-17 NOTE — Telephone Encounter (Signed)
Follow up     Daughter calling back asking can she be seen today. Patient is schedule for blood work today.

## 2013-12-17 NOTE — Progress Notes (Signed)
[place] for heparin Indication: chest pain/ACS  Allergies  Allergen Reactions  . Sulfa Antibiotics Other (See Comments)    Breaking out    Patient Measurements: Height: 5\' 1"  (154.9 cm) Weight: 144 lb (65.318 kg) IBW/kg (Calculated) : 47.8 Heparin Dosing Weight: 47kg  Vital Signs: Temp: 97.5 F (36.4 C) (02/10 1246) Temp src: Oral (02/10 1246) BP: 113/73 mmHg (02/10 1246) Pulse Rate: 80 (02/10 1345)  Labs:  Recent Labs  12/17/13 1330  HGB 10.8*  HCT 35.1*  PLT 208  CREATININE 1.29*    Estimated Creatinine Clearance: 27.6 ml/min (by C-G formula based on Cr of 1.29).   Medical History: Past Medical History  Diagnosis Date  . Acid reflux   . Hypertension   . Hypercholesterolemia   . Asthma   . Myocardial infarction 2014; 07/03/2013  . COPD (chronic obstructive pulmonary disease)   . Pneumonia     "as a child and again in 2013" (07/04/2013)  . Chronic bronchitis     "once or twice/yr" (07/04/2013)  . Pernicious anemia   . History of blood transfusion     "cause my blood was low" (07/04/2013)  . ONGEXBMW(413.2)     "probably weekly" (07/04/2013)  . Acoustic neuroma     "Dr. Melissa Montane is testing for this in her right ear right now" (07/04/2013)  . Deafness in right ear   . Arthritis     "left knee; back" (07/04/2013)  . Lumbar compression fracture 2010; 2012    "fell; fell" (07/04/2013)  . Anxiety   . Chronic cystitis   . Chronic combined systolic and diastolic CHF (congestive heart failure)     Echo (11/2011): Mild LVH, EF 35-40%, mid to distal anteroseptal, anterior and apical akinesis, moderate LAE.  Marland Kitchen Coronary artery disease     non-STEMI in 06/2013. LHC (06/2013): Proximal LAD 90, mid LAD occluded, mid to distal LAD filled by left to left collaterals, lower pole of the diagonal occluded, proximal OM1 90, proximal to mid RCA 90, EF 40% apical and distal anterior hypokinesis. Patient declined bypass surgery. She ultimately  underwent PCI of the OM1 (2.5 x 16 mm Promus DES);  PCI of RCA if uncontrolled symptoms   Assessment: 80 year old female presents to [facility] with sob and not feeling well. Cardiac enzymes positive, new orders received to start IV heparin for rule out acs. CBC appears stable from previous admission, not on anticoagulants prior to admission.   Goal of Therapy:  Heparin level 0.3-0.7 units/ml Monitor platelets by anticoagulation protocol: Yes   Plan:  Give 3000 units bolus x 1 Start heparin infusion at 550 units/hr Check anti-Xa level in 8 hours and daily while on heparin Continue to monitor H&H and platelets  Erin Hearing PharmD., BCPS Clinical Pharmacist Pager [phone] 12/17/2013 2:52 PM

## 2013-12-18 ENCOUNTER — Encounter (HOSPITAL_COMMUNITY)
Admission: EM | Disposition: A | Discharge status: Hospice | Payer: Medicare HMO | Source: Home / Self Care | Attending: Cardiovascular Disease

## 2013-12-18 DIAGNOSIS — N189 Chronic kidney disease, unspecified: Secondary | ICD-10-CM

## 2013-12-18 DIAGNOSIS — I2510 Atherosclerotic heart disease of native coronary artery without angina pectoris: Secondary | ICD-10-CM

## 2013-12-18 HISTORY — PX: LEFT HEART CATHETERIZATION WITH CORONARY ANGIOGRAM: SHX5451

## 2013-12-18 LAB — CBC
HCT: 30.3 % — ABNORMAL LOW (ref 36.0–46.0)
HEMOGLOBIN: 9.8 g/dL — AB (ref 12.0–15.0)
MCH: 26.8 pg (ref 26.0–34.0)
MCHC: 32.3 g/dL (ref 30.0–36.0)
MCV: 83 fL (ref 78.0–100.0)
Platelets: 207 10*3/uL (ref 150–400)
RBC: 3.65 MIL/uL — AB (ref 3.87–5.11)
RDW: 14.8 % (ref 11.5–15.5)
WBC: 5.6 10*3/uL (ref 4.0–10.5)

## 2013-12-18 LAB — LIPID PANEL
CHOL/HDL RATIO: 3.9 ratio
Cholesterol: 112 mg/dL (ref 0–200)
HDL: 29 mg/dL — ABNORMAL LOW (ref >39)
LDL CALC: 41 mg/dL (ref 0–99)
Triglycerides: 212 mg/dL — ABNORMAL HIGH (ref <150)
VLDL: 42 mg/dL — AB (ref 0–40)

## 2013-12-18 LAB — BASIC METABOLIC PANEL
BUN: 34 mg/dL — AB (ref 6–23)
CALCIUM: 9.4 mg/dL (ref 8.4–10.5)
CO2: 26 meq/L (ref 19–32)
Chloride: 96 mEq/L (ref 96–112)
Creatinine, Ser: 1.42 mg/dL — ABNORMAL HIGH (ref 0.50–1.10)
GFR calc Af Amer: 38 mL/min — ABNORMAL LOW (ref >90)
GFR calc non Af Amer: 33 mL/min — ABNORMAL LOW (ref >90)
Glucose, Bld: 110 mg/dL — ABNORMAL HIGH (ref 70–99)
Potassium: 3.6 mEq/L — ABNORMAL LOW (ref 3.7–5.3)
Sodium: 139 mEq/L (ref 137–147)

## 2013-12-18 LAB — PROTIME-INR
INR: 1.05 (ref 0.00–1.49)
PROTHROMBIN TIME: 13.5 s (ref 11.6–15.2)

## 2013-12-18 LAB — TROPONIN I: Troponin I: 0.56 ng/mL (ref <0.30)

## 2013-12-18 LAB — HEPARIN LEVEL (UNFRACTIONATED): Heparin Unfractionated: 0.13 IU/mL — ABNORMAL LOW (ref 0.30–0.70)

## 2013-12-18 SURGERY — LEFT HEART CATHETERIZATION WITH CORONARY ANGIOGRAM
Anesthesia: Moderate Sedation | Laterality: Bilateral

## 2013-12-18 MED ORDER — SODIUM CHLORIDE 0.9 % IV SOLN: INTRAVENOUS | Status: AC

## 2013-12-18 MED ORDER — MORPHINE SULFATE 2 MG/ML IJ SOLN
INTRAMUSCULAR | Status: AC
  Administered 2013-12-18: 2 mg via INTRAVENOUS
  Filled 2013-12-18: qty 1

## 2013-12-18 MED ORDER — MORPHINE SULFATE 2 MG/ML IJ SOLN
2.0000 mg | INTRAMUSCULAR | Status: DC | PRN
  Administered 2013-12-19 – 2013-12-20 (×3): 2 mg via INTRAVENOUS
  Filled 2013-12-18 (×3): qty 1

## 2013-12-18 MED ORDER — FENTANYL CITRATE 0.05 MG/ML IJ SOLN
INTRAMUSCULAR | Status: AC
  Filled 2013-12-18: qty 2

## 2013-12-18 MED ORDER — CLOPIDOGREL BISULFATE 75 MG PO TABS
75.0000 mg | ORAL_TABLET | Freq: Every day | ORAL | Status: DC
  Administered 2013-12-19 – 2013-12-20 (×2): 75 mg via ORAL
  Filled 2013-12-18 (×2): qty 1

## 2013-12-18 MED ORDER — HEPARIN BOLUS VIA INFUSION
1500.0000 [IU] | Freq: Once | INTRAVENOUS | Status: AC
  Administered 2013-12-18: 1500 [IU] via INTRAVENOUS
  Filled 2013-12-18: qty 1500

## 2013-12-18 MED ORDER — HEPARIN (PORCINE) IN NACL 2-0.9 UNIT/ML-% IJ SOLN
INTRAMUSCULAR | Status: AC
  Filled 2013-12-18: qty 1000

## 2013-12-18 MED ORDER — NITROGLYCERIN 0.2 MG/ML ON CALL CATH LAB
INTRAVENOUS | Status: AC
  Filled 2013-12-18: qty 1

## 2013-12-18 MED ORDER — LIDOCAINE HCL (PF) 1 % IJ SOLN
INTRAMUSCULAR | Status: AC
  Filled 2013-12-18: qty 30

## 2013-12-18 MED ORDER — SALINE SPRAY 0.65 % NA SOLN
1.0000 | NASAL | Status: DC | PRN
  Administered 2013-12-18 (×2): 1 via NASAL
  Filled 2013-12-18: qty 44

## 2013-12-18 MED ORDER — MIDAZOLAM HCL 2 MG/2ML IJ SOLN
INTRAMUSCULAR | Status: AC
  Filled 2013-12-18: qty 2

## 2013-12-18 NOTE — Progress Notes (Signed)
[place] for heparin Indication: chest pain/ACS  Allergies  Allergen Reactions  . Sulfa Antibiotics Other (See Comments)    Breaking out    Patient Measurements: Height: 5\' 2"  (157.5 cm) Weight: 142 lb 3.2 oz (64.5 kg) IBW/kg (Calculated) : 50.1 Heparin Dosing Weight: 47kg  Vital Signs: Temp: 98.6 F (37 C) (02/11 0000) Temp src: Oral (02/11 0000) BP: 131/83 mmHg (02/10 2236) Pulse Rate: 89 (02/10 2236)  Labs:  Recent Labs  12/17/13 1330 12/17/13 1707 12/17/13 2245 12/18/13 0034 12/18/13 0035  HGB 10.8*  --   --  9.8*  --   HCT 35.1*  --   --  30.3*  --   PLT 208  --   --  207  --   LABPROT  --   --   --  13.5  --   INR  --   --   --  1.05  --   HEPARINUNFRC  --   --   --   --  0.13*  CREATININE 1.29*  --   --  1.42*  --   TROPONINI  --  0.72* 0.69*  --   --     Estimated Creatinine Clearance: 25.6 ml/min (by C-G formula based on Cr of 1.42).  Assessment: 80 y.o. female with chest pain for heparin   Goal of Therapy:  Heparin level 0.3-0.7 units/ml Monitor platelets by anticoagulation protocol: Yes   Plan:  Heparin 1500 units IV bolus, then increase heparin 800 units/hr  Follow-up am labs.  Phillis Knack, PharmD, BCPS  12/18/2013 2:26 AM

## 2013-12-18 NOTE — Progress Notes (Signed)
Subjective: Breathing is OK  No CP Objective: Filed Vitals:   12/18/13 0200 12/18/13 0400 12/18/13 0430 12/18/13 0731  BP:   111/66   Pulse: 80 77    Temp:   98.5 F (36.9 C) 98.4 F (36.9 C)  TempSrc:   Oral Oral  Resp: 15 24    Height:      Weight:      SpO2: 96% 98%    BP 88/ to 120 Weight change:   Intake/Output Summary (Last 24 hours) at 12/18/13 0755 Last data filed at 12/18/13 0600  Gross per 24 hour  Intake  179.5 ml  Output   1705 ml  Net -1525.5 ml    General: Alert, awake, oriented x3, in no acute distress Neck:  JVP is normal Heart: Regular rate and rhythm, without murmurs, rubs, gallops.  Lungs: Clear to auscultation.  No rales or wheezes. Exemities:  R arm with signif ecchymoses Neuro: Grossly intact, nonfocal.  Tele:  Afib  70s   Lab Results: Results for orders placed during the hospital encounter of 12/17/13 (from the past 24 hour(s))  CBC     Status: Abnormal   Collection Time    12/17/13  1:30 PM      Result Value Ref Range   WBC 7.3  4.0 - 10.5 K/uL   RBC 4.17  3.87 - 5.11 MIL/uL   Hemoglobin 10.8 (*) 12.0 - 15.0 g/dL   HCT 35.1 (*) 36.0 - 46.0 %   MCV 84.2  78.0 - 100.0 fL   MCH 25.9 (*) 26.0 - 34.0 pg   MCHC 30.8  30.0 - 36.0 g/dL   RDW 14.9  11.5 - 15.5 %   Platelets 208  150 - 400 K/uL  COMPREHENSIVE METABOLIC PANEL     Status: Abnormal   Collection Time    12/17/13  1:30 PM      Result Value Ref Range   Sodium 137  137 - 147 mEq/L   Potassium 4.6  3.7 - 5.3 mEq/L   Chloride 96  96 - 112 mEq/L   CO2 26  19 - 32 mEq/L   Glucose, Bld 116 (*) 70 - 99 mg/dL   BUN 36 (*) 6 - 23 mg/dL   Creatinine, Ser 1.29 (*) 0.50 - 1.10 mg/dL   Calcium 9.9  8.4 - 10.5 mg/dL   Total Protein 7.0  6.0 - 8.3 g/dL   Albumin 3.7  3.5 - 5.2 g/dL   AST 24  0 - 37 U/L   ALT 11  0 - 35 U/L   Alkaline Phosphatase 88  39 - 117 U/L   Total Bilirubin 0.5  0.3 - 1.2 mg/dL   GFR calc non Af Amer 37 (*) >90 mL/min   GFR calc Af Amer 43 (*) >90 mL/min  PRO B  NATRIURETIC PEPTIDE     Status: Abnormal   Collection Time    12/17/13  1:30 PM      Result Value Ref Range   Pro B Natriuretic peptide (BNP) 9428.0 (*) 0 - 450 pg/mL  POCT I-STAT TROPONIN I     Status: Abnormal   Collection Time    12/17/13  1:42 PM      Result Value Ref Range   Troponin i, poc 1.05 (*) 0.00 - 0.08 ng/mL   Comment NOTIFIED PHYSICIAN     Comment 3           TROPONIN I     Status: Abnormal   Collection Time  12/17/13  5:07 PM      Result Value Ref Range   Troponin I 0.72 (*) <0.30 ng/mL  MRSA PCR SCREENING     Status: None   Collection Time    12/17/13  9:02 PM      Result Value Ref Range   MRSA by PCR NEGATIVE  NEGATIVE  URINALYSIS, ROUTINE W REFLEX MICROSCOPIC     Status: Abnormal   Collection Time    12/17/13  9:31 PM      Result Value Ref Range   Color, Urine YELLOW  YELLOW   APPearance CLEAR  CLEAR   Specific Gravity, Urine 1.009  1.005 - 1.030   pH 7.5  5.0 - 8.0   Glucose, UA NEGATIVE  NEGATIVE mg/dL   Hgb urine dipstick NEGATIVE  NEGATIVE   Bilirubin Urine NEGATIVE  NEGATIVE   Ketones, ur NEGATIVE  NEGATIVE mg/dL   Protein, ur NEGATIVE  NEGATIVE mg/dL   Urobilinogen, UA 0.2  0.0 - 1.0 mg/dL   Nitrite POSITIVE (*) NEGATIVE   Leukocytes, UA SMALL (*) NEGATIVE  URINE MICROSCOPIC-ADD ON     Status: Abnormal   Collection Time    12/17/13  9:31 PM      Result Value Ref Range   Squamous Epithelial / LPF RARE  RARE   WBC, UA 3-6  <3 WBC/hpf   RBC / HPF 0-2  <3 RBC/hpf   Bacteria, UA FEW (*) RARE  TROPONIN I     Status: Abnormal   Collection Time    12/17/13 10:45 PM      Result Value Ref Range   Troponin I 0.69 (*) <0.30 ng/mL  CBC     Status: Abnormal   Collection Time    12/18/13 12:34 AM      Result Value Ref Range   WBC 5.6  4.0 - 10.5 K/uL   RBC 3.65 (*) 3.87 - 5.11 MIL/uL   Hemoglobin 9.8 (*) 12.0 - 15.0 g/dL   HCT 30.3 (*) 36.0 - 46.0 %   MCV 83.0  78.0 - 100.0 fL   MCH 26.8  26.0 - 34.0 pg   MCHC 32.3  30.0 - 36.0 g/dL   RDW 14.8   11.5 - 15.5 %   Platelets 207  150 - 400 K/uL  BASIC METABOLIC PANEL     Status: Abnormal   Collection Time    12/18/13 12:34 AM      Result Value Ref Range   Sodium 139  137 - 147 mEq/L   Potassium 3.6 (*) 3.7 - 5.3 mEq/L   Chloride 96  96 - 112 mEq/L   CO2 26  19 - 32 mEq/L   Glucose, Bld 110 (*) 70 - 99 mg/dL   BUN 34 (*) 6 - 23 mg/dL   Creatinine, Ser 1.42 (*) 0.50 - 1.10 mg/dL   Calcium 9.4  8.4 - 10.5 mg/dL   GFR calc non Af Amer 33 (*) >90 mL/min   GFR calc Af Amer 38 (*) >90 mL/min  LIPID PANEL     Status: Abnormal   Collection Time    12/18/13 12:34 AM      Result Value Ref Range   Cholesterol 112  0 - 200 mg/dL   Triglycerides 212 (*) <150 mg/dL   HDL 29 (*) >39 mg/dL   Total CHOL/HDL Ratio 3.9     VLDL 42 (*) 0 - 40 mg/dL   LDL Cholesterol 41  0 - 99 mg/dL  PROTIME-INR  Status: None   Collection Time    12/18/13 12:34 AM      Result Value Ref Range   Prothrombin Time 13.5  11.6 - 15.2 seconds   INR 1.05  0.00 - 1.49  HEPARIN LEVEL (UNFRACTIONATED)     Status: Abnormal   Collection Time    12/18/13 12:35 AM      Result Value Ref Range   Heparin Unfractionated 0.13 (*) 0.30 - 0.70 IU/mL    Studies/Results: @RISRSLT24 @  Medications:  Reviewed   @PROBHOSP @  1.  CAD / NSTEMI  Patient presented yesterday with SOB (angina equiv)  Trop elevated.  Plan for Cath/PCI of RCA.   Patient currently having some chest tightness Will Rx with MSO4 and NTG as needed   2  Renal  Follow with procedure.  patinet with some increased volume on exam  Will need to watch and Rx after.      LOS: 1 day   Dorris Carnes 12/18/2013, 7:55 AM

## 2013-12-18 NOTE — H&P (View-Only) (Signed)
Pt complained of 8/10 chest pressure. Dr Harrington Challenger at bedside. Morphine 2mg  iv given. VSS. Pt states chest pressure has significantly improved since medication administration.

## 2013-12-18 NOTE — Interval H&P Note (Signed)
Cath Lab Visit (complete for each Cath Lab visit)  Clinical Evaluation Leading to the Procedure:   ACS: no  Non-ACS:    Anginal Classification: CCS III  Anti-ischemic medical therapy: Maximal Therapy (2 or more classes of medications)  Non-Invasive Test Results: No non-invasive testing performed  Prior CABG: No previous CABG      History and Physical Interval Note:  12/18/2013 2:06 PM  [name]  has presented today for surgery, with the diagnosis of cp   [place]  The various methods of treatment have been discussed with the patient and family. After consideration of risks, benefits and other options for treatment, the patient has consented to  Procedure(s): LEFT HEART CATHETERIZATION WITH CORONARY ANGIOGRAM (Bilateral) as a surgical intervention .  The patient's history has been reviewed, patient examined, no change in status, stable for surgery.  I have reviewed the patient's chart and labs.  Questions were answered to the patient's satisfaction.     Sinclair Grooms

## 2013-12-18 NOTE — CV Procedure (Signed)
     Left Heart Catheterization with Coronary Angiography  Report  [name]  80 y.o.  female Nov 28, 1925  Procedure Date: 12/18/2013  Referring Physician: Acie Fredrickson, M.D. Primary Cardiologist:: Lendell Caprice, M.D.  INDICATIONS: Congestive heart failure, systolic, acute on chronic, ischemically mediated  PROCEDURE: 1. Coronary angiography; 2. Left heart catheterization  CONSENT:  The risks, benefits, and details of the procedure were explained in detail to the patient. Risks including death, stroke, heart attack, kidney injury, allergy, limb ischemia, bleeding and radiation injury were discussed.  The patient verbalized understanding and wanted to proceed.  Informed written consent was obtained.  PROCEDURE TECHNIQUE:  After Xylocaine anesthesia a 5 French sheath was placed in the right  femoral artery with a single anterior needle wall stick.  Coronary angiography was done using a  5 [place] A2 MP catheter.  Left ventriculography was  not performed.  Angio-Seal was used with good hemostasis  MEDICATIONS:  Versed 0.5 mg; fentanyl 25 mcg IV  CONTRAST:  Total of of 35 cc.  COMPLICATIONS:   None     HEMODYNAMICS:  Aortic pressure  101/57 mmHg; Left ventricular Pressure not recorded  ANGIOGRAPHIC DATA:   The left main coronary artery is widely patent .  The left anterior descending artery is totally occluded after the origin of the first diagonal. Proximal to the diagonal there is 95% stenosis. The major cell perforator arises after this proximal occlusion. The distal LAD and diagonals fill by left to left collaterals. The stenosis/anatomy has been previously noted.  The left circumflex artery is highly diseased. The mid segment stent is widely patent. There is focal 99% obstruction proximal to the bifurcation of the obtuse marginal beyond the previously stented site. TIMI grade 2 flow was noted beyond the obstruction. Left left collaterals also noted..  The right coronary artery  is highly diseased and functionally occluded distally. There is segmental 99% stenosis in the proximal to mid vessel. There is segmental 99% stenosis distally. The PDA fills by antegrade and collateral flow. Collaterals to the distal right coronary is seen to occur with left coronary injection from the circumflex territory and also through the first septal perforator.Marland Kitchen   LEFT VENTRICULOGRAM:  Left ventricular angiogram was not performed.   IMPRESSIONS:   1. Severe three-vessel coronary artery disease with total occlusion of proximal to mid LAD, subtotal occlusion of mid to distal circumflex beyond previously placed stent, and subtotal occlusion of the proximal and distal RCA. The LAD, distal circumflex, and RCA are collateralized although collaterals are not mature.  2. Left ventriculography was not performed   RECOMMENDATION:   Coronary bypass grafting has removed from the treatment options by the patient. Given her age and frailty, this appears to be a wise decision.  Percutaneous intervention would need to be a three-vessel procedure with proximal LAD stent, distal circumflex stent, and multiple stents in the right coronary artery. Given the patient's renal function, age, and aggressive nature of her disease, the most prudent approach would seem to be medical therapy with consideration of hospice and palliative care. I mentioned this to the patient's daughter who is a Marine scientist.  Follow renal function over the next 48 hours to look for evidence of acute kidney injury in the setting of chronic renal insufficiency.  Gentle IV hydration x5 hours.

## 2013-12-18 NOTE — Interval H&P Note (Signed)
History and Physical Interval Note:  12/18/2013 2:08 PM The patient is elderly, has stage III-IV chronic kidney disease, recurrent acute on chronic diastolic heart failure episodes, and three-vessel coronary disease with total occlusion of the mid LAD, high-grade proximal disease, previously stented circumflex, and high-grade tortuous native right coronary. Her creatinine is 1.46. She had trace increase in troponin. I had a long discussion with the patient and her daughter expressed and the very high risk nature of catheterization and possible PCI. She has previously refused coronary bypass grafting and states that she still feels the same way. She is being restudied at this time to define anatomy and determine whether or not percutaneous intervention may be helpful at stabilizing her symptoms which are felt to be an anginal equivalent of dyspnea [name]  has presented today for surgery, with the diagnosis of cp   [place]  The various methods of treatment have been discussed with the patient and family. After consideration of risks, benefits and other options for treatment, the patient has consented to  Procedure(s): LEFT HEART CATHETERIZATION WITH CORONARY ANGIOGRAM (Bilateral) as a surgical intervention .  The patient's history has been reviewed, patient examined, no change in status, stable for surgery.  I have reviewed the patient's chart and labs.  Questions were answered to the patient's satisfaction.   We discussed the possibility of PCI and for potential impact of excessive contrast on kidney function. She would consider acute dialysis.  Sinclair Grooms

## 2013-12-18 NOTE — Progress Notes (Signed)
Pt complained of 8/10 chest pressure. Dr Ross at bedside. Morphine 2mg iv given. VSS. Pt states chest pressure has significantly improved since medication administration. 

## 2013-12-18 NOTE — Progress Notes (Signed)
Utilization Review Completed.  

## 2013-12-19 DIAGNOSIS — I509 Heart failure, unspecified: Secondary | ICD-10-CM

## 2013-12-19 LAB — BASIC METABOLIC PANEL
BUN: 35 mg/dL — ABNORMAL HIGH (ref 6–23)
CO2: 22 mEq/L (ref 19–32)
Calcium: 10.4 mg/dL (ref 8.4–10.5)
Chloride: 99 mEq/L (ref 96–112)
Creatinine, Ser: 1.28 mg/dL — ABNORMAL HIGH (ref 0.50–1.10)
GFR calc non Af Amer: 37 mL/min — ABNORMAL LOW (ref >90)
GFR, EST AFRICAN AMERICAN: 43 mL/min — AB (ref >90)
Glucose, Bld: 112 mg/dL — ABNORMAL HIGH (ref 70–99)
POTASSIUM: 5 meq/L (ref 3.7–5.3)
Sodium: 138 mEq/L (ref 137–147)

## 2013-12-19 MED ORDER — FUROSEMIDE 10 MG/ML IJ SOLN
40.0000 mg | Freq: Every day | INTRAMUSCULAR | Status: DC
  Administered 2013-12-19 – 2013-12-20 (×2): 40 mg via INTRAVENOUS
  Filled 2013-12-19 (×2): qty 4

## 2013-12-19 MED ORDER — ALUM & MAG HYDROXIDE-SIMETH 200-200-20 MG/5ML PO SUSP
30.0000 mL | ORAL | Status: DC | PRN
  Administered 2013-12-19: 30 mL via ORAL
  Filled 2013-12-19: qty 30

## 2013-12-19 NOTE — Progress Notes (Signed)
Pharmacist Heart Failure Core Measure Documentation  Assessment: [name] has an EF documented as 35-40% on 11/10/13 by ECHO.  Rationale: Heart failure patients with left ventricular systolic dysfunction (LVSD) and an EF < 40% should be prescribed an angiotensin converting enzyme inhibitor (ACEI) or angiotensin receptor blocker (ARB) at discharge unless a contraindication is documented in the medical record.  This patient is not currently on an ACEI or ARB for HF.  This note is being placed in the record in order to provide documentation that a contraindication to the use of these agents is present for this encounter.  ACE Inhibitor or Angiotensin Receptor Blocker is contraindicated (specify all that apply)  []   ACEI allergy AND ARB allergy []   Angioedema []   Moderate or severe aortic stenosis []   Hyperkalemia []   Hypotension []   Renal artery stenosis [x]   Worsening renal function, preexisting renal disease or dysfunction   Deboraha Sprang 12/19/2013 9:24 AM

## 2013-12-19 NOTE — Progress Notes (Signed)
Thank you for consulting the Palliative Medicine Team at [facility] to meet your patient's and family's needs.   The reason that you asked [place] to see your patient is  For  Clarification of GOC and options  We have scheduled your patient for a meeting: tomorrow morning 12-20-13 at [place] NP  Palliative Medicine Team Team Phone # [phone] Pager [phone]

## 2013-12-19 NOTE — Progress Notes (Signed)
    Subjective:  Short of breath. No further chest pain. No complaints at rest.  Objective:  Vital Signs in the last 24 hours: Temp:  [97.4 F (36.3 C)-98.4 F (36.9 C)] 98.1 F (36.7 C) (02/12 1125) Pulse Rate:  [74-87] 87 (02/12 1200) Resp:  [20-24] 20 (02/12 1125) BP: (104-138)/(60-114) 138/114 mmHg (02/12 1200) SpO2:  [95 %-100 %] 98 % (02/12 1200)  Intake/Output from previous day: 02/11 0701 - 02/12 0700 In: 1910 [P.O.:920; I.V.:990] Out: 710 [Urine:710]  Physical Exam: Pt is alert and oriented, elderly woman in NAD HEENT: normal Neck: JVP - moderately elevated Lungs: basilar rales bilaterally CV: RRR without murmur or gallop Abd: soft, NT, Positive BS, no hepatomegaly Ext: no C/C/E Skin: warm/dry no rash   Lab Results:  Recent Labs  12/17/13 1330 12/18/13 0034  WBC 7.3 5.6  HGB 10.8* 9.8*  PLT 208 207    Recent Labs  12/17/13 1330 12/18/13 0034  NA 137 139  K 4.6 3.6*  CL 96 96  CO2 26 26  GLUCOSE 116* 110*  BUN 36* 34*  CREATININE 1.29* 1.42*    Recent Labs  12/17/13 2245 12/18/13 0940  TROPONINI 0.69* 0.56*    Tele: Sinus rhythm  Echo 11/10/13: Study Conclusions  - Left ventricle: The cavity size was normal. There was mild concentric hypertrophy. Systolic function was moderately reduced. The estimated ejection fraction was in the range of 35% to 40%. Akinesis of the mid-distalanteroseptal, anterior, and apical myocardium. - Left atrium: The atrium was moderately dilated.  Assessment/Plan:  1. Acute on chronic systolic heart failure 2. NSTEMI with severe multivessel CAD 3. CKD, stage 3  I have reviewed notes of Dr's Tamala Julian and [place], as well as the patient's cardiac cath films from yesterday and previous studies. She has marked progression of CAD int he RCA and LCx. Considering her advanced age, heart failure, and chronic kidney disease, as well as her personal desire to avoid CABG and other invasive therapies, I agree that a  palliative/comfort approach is most appropriate. Discussed this at length with the patient and her daughter who are in full agreement with this approach to care.   Will try to optimize her heart failure regimen and add IV diuretic today to improve pulmonary congestion. Anticipate discharge home tomorrow if arrangements can be made with home Hospice.   Sherren Mocha, M.D. 12/19/2013, 1:01 PM

## 2013-12-20 ENCOUNTER — Encounter (HOSPITAL_COMMUNITY): Payer: Self-pay | Admitting: Cardiology

## 2013-12-20 DIAGNOSIS — Z515 Encounter for palliative care: Secondary | ICD-10-CM

## 2013-12-20 DIAGNOSIS — R531 Weakness: Secondary | ICD-10-CM

## 2013-12-20 DIAGNOSIS — R5383 Other fatigue: Secondary | ICD-10-CM

## 2013-12-20 DIAGNOSIS — R0609 Other forms of dyspnea: Secondary | ICD-10-CM

## 2013-12-20 DIAGNOSIS — R5381 Other malaise: Secondary | ICD-10-CM

## 2013-12-20 DIAGNOSIS — R0989 Other specified symptoms and signs involving the circulatory and respiratory systems: Secondary | ICD-10-CM

## 2013-12-20 DIAGNOSIS — Z66 Do not resuscitate: Secondary | ICD-10-CM

## 2013-12-20 HISTORY — DX: Do not resuscitate: Z66

## 2013-12-20 LAB — BASIC METABOLIC PANEL
BUN: 38 mg/dL — ABNORMAL HIGH (ref 6–23)
CALCIUM: 9.8 mg/dL (ref 8.4–10.5)
CHLORIDE: 98 meq/L (ref 96–112)
CO2: 22 mEq/L (ref 19–32)
Creatinine, Ser: 1.47 mg/dL — ABNORMAL HIGH (ref 0.50–1.10)
GFR calc non Af Amer: 31 mL/min — ABNORMAL LOW (ref >90)
GFR, EST AFRICAN AMERICAN: 36 mL/min — AB (ref >90)
Glucose, Bld: 113 mg/dL — ABNORMAL HIGH (ref 70–99)
Potassium: 5.2 mEq/L (ref 3.7–5.3)
SODIUM: 136 meq/L — AB (ref 137–147)

## 2013-12-20 MED ORDER — LORAZEPAM 0.5 MG PO TABS: 0.5000 mg | ORAL_TABLET | ORAL | Status: DC | PRN

## 2013-12-20 MED ORDER — MORPHINE SULFATE (CONCENTRATE) 10 MG /0.5 ML PO SOLN: 5.0000 mg | ORAL | Status: DC | PRN

## 2013-12-20 MED ORDER — FUROSEMIDE 40 MG PO TABS: 80.0000 mg | ORAL_TABLET | Freq: Every day | ORAL | Status: AC

## 2013-12-20 MED ORDER — SALINE SPRAY 0.65 % NA SOLN: 1.0000 | NASAL | Status: AC | PRN

## 2013-12-20 MED ORDER — LORAZEPAM 0.5 MG PO TABS: 0.5000 mg | ORAL_TABLET | ORAL | Status: AC | PRN

## 2013-12-20 MED ORDER — FUROSEMIDE 80 MG PO TABS
80.0000 mg | ORAL_TABLET | Freq: Every day | ORAL | Status: DC
  Filled 2013-12-20: qty 1

## 2013-12-20 NOTE — Progress Notes (Signed)
    Subjective:  Short of breath. No chest pain. No other complaints. Eager to go home.  Objective:  Vital Signs in the last 24 hours: Temp:  [97.4 F (36.3 C)-98.3 F (36.8 C)] 97.4 F (36.3 C) (02/13 0800) Pulse Rate:  [74-87] 79 (02/13 0800) Resp:  [20-21] 21 (02/13 0800) BP: (115-138)/(75-114) 121/88 mmHg (02/13 0800) SpO2:  [97 %-100 %] 100 % (02/13 0800)  Intake/Output from previous day: 02/12 0701 - 02/13 0700 In: 696 [P.O.:690] Out: 725 [Urine:725]  Physical Exam: Pt is alert and oriented, elderly woman in NAD HEENT: normal Neck: JVP - mildly increased Lungs: inspiratory rales at bases bilaterally CV: RRR without murmur or gallop Abd: soft, NT, Positive BS, no hepatomegaly Ext: no C/C/E, distal pulses intact and equal Skin: warm/dry no rash   Lab Results:  Recent Labs  12/17/13 1330 12/18/13 0034  WBC 7.3 5.6  HGB 10.8* 9.8*  PLT 208 207    Recent Labs  12/19/13 1020 12/20/13 0326  NA 138 136*  K 5.0 5.2  CL 99 98  CO2 22 22  GLUCOSE 112* 113*  BUN 35* 38*  CREATININE 1.28* 1.47*    Recent Labs  12/17/13 2245 12/18/13 0940  TROPONINI 0.69* 0.56*   Tele: Sinus rhythm  Assessment/Plan:  1. Acute on chronic systolic heart failure  2. NSTEMI with severe multivessel CAD  3. CKD, stage 3  See 2/12 note. Palliative care has seen patient and arranged home hospice. Greatly appreciate their care. Discussed plan with patient and daughter who are agreeable, also with her primary cardiologist, Dr Irish Lack. Home with focus on comfort. Will stop statin drug. Continue other medications. Change lasix to 80 mg oral daily. Stop KDur as potassium level is mildly elevated. Continue ASA, plavix, and antianginals.   Sherren Mocha, M.D. 12/20/2013, 11:49 AM

## 2013-12-20 NOTE — Progress Notes (Signed)
12/20/2013 1430 NCM faxed signed orders, DME orders, and dc summary to Hospice. [facility] to deliver portable tank. Jonnie Finner RN CCM Case Mgmt phone [phone]

## 2013-12-20 NOTE — Consult Note (Signed)
Patient [id number] [name]      DOB: 1926/02/01      [id number]     Consult Note from the Palliative Medicine Team at [facility] Requested by: Dr Aundra Dubin     PCP: [name], MD Reason for Consultation:Clarification of [place] and options     Phone Number:[phone]  Assessment of patients Current state: [name] is a 80 y.o. Female, followed by Dr. Irish Lack, with a history of CAD, ischemic cardiomyopathy, combined systolic and diastolic CHF, HTN, COPD, HL, CKD, prior DVT (not on Coumadin). Echo in 02/2013 demonstrated an EF of 40-50%. She has been hospitalized several times over the last several months. She suffered a non-STEMI in 06/2013. LHC (06/2013): Proximal LAD 90, mid LAD occluded, mid to distal LAD filled by left to left collaterals, lower pole of the diagonal occluded, proximal OM1 90, proximal to mid RCA 90, EF 40% apical and distal anterior hypokinesis. Patient declined bypass surgery. She ultimately underwent PCI of the OM1 (2.5 x 16 mm Promus DES). Medical therapy was recommended for her LAD and RCA disease.  She was most recently admitted from 1/4-1/7 with a non-STEMI in the setting of acute on chronic systolic and diastolic CHF. Readmitted on 12-17-13--pateint and family have made decision to shift to comfort and to dc home with hospice services.       This NP Wadie Lessen reviewed medical records, received report from team, assessed the patient and then meet at the patient's bedside along with her daughter Wallene Huh  to discuss diagnosis prognosis, GOC, EOL wishes disposition and options.   A detailed discussion was had today regarding advanced directives.  Concepts specific to code status, artifical feeding and hydration, continued IV antibiotics and rehospitalization was had.  The difference between a aggressive medical intervention path  and a palliative comfort care path for this patient at this time was had.  Values and goals of care important  to patient and family were attempted to be elicited.  Concept of Hospice and Palliative Care were discussed   MOST form completed  Natural trajectory and expectations at EOL were discussed.  Questions and concerns addressed.  Hard Choices booklet left for review. Family encouraged to call with questions or concerns.  PMT will continue to support holistically.   Goals of Care: 1.  Code Status:DNr/DNI-comfort is main focus of care   2. Scope of Treatment: 1. Vital Signs: daily  2. Respiratory/Oxygen:for comfort only 3. Nutritional Support/Tube Feeds:no artificial feeding now or in the future 4. Antibiotics: determine use if/when infection occurs 5. Blood Products: none 6. [id number] 7. Review of Medications to be discontinued:minimize for comfort 8. Labs:none 9. Telemetry:none 10. Consults:   [facility]   4. Disposition:  Home with hospice services   3. Symptom Management:   1. Anxiety/Agitation: Ativan 1 mg po every 6 hrs prn 2. Pain/Dyspnea: Roxanol 5 mg po every 2 hrs prn  4. Psychosocial:  Emotional support to patient and her daughter.  Both understand the overall poor prognosis and hope now for comfort and dignity.  5. Spiritual:  Strong community church support   Patient Documents Completed or Given: Document Given Completed  Advanced Directives Pkt    MOST  yes  DNR  yes  Gone from My Sight    Hard Choices yes     Brief HPI: [name] is a 80 y.o. Female, followed by Dr. Irish Lack, with a history of CAD, ischemic cardiomyopathy, combined systolic and diastolic  CHF, HTN, COPD, HL, CKD, prior DVT (not on Coumadin). Echo in 02/2013 demonstrated an EF of 40-50%. She has been hospitalized several times over the last several months. She suffered a non-STEMI in 06/2013. LHC (06/2013): Proximal LAD 90, mid LAD occluded, mid to distal LAD filled by left to left collaterals, lower pole of the diagonal occluded, proximal OM1 90, proximal to mid RCA 90, EF 40%  apical and distal anterior hypokinesis. Patient declined bypass surgery. She ultimately underwent PCI of the OM1 (2.5 x 16 mm Promus DES). Medical therapy was recommended for her LAD and RCA disease.  She was most recently admitted from 1/4-1/7 with a non-STEMI in the setting of acute on chronic systolic and diastolic CHF. This was likely felt to be a type II non-STEMI. Her prior angiograms were reviewed and it was felt that PCI of the RCA could be considered first if she had ongoing symptoms. Referral for CABG would be a last resort. She was stable on medical therapy at discharge. Echo (11/2011): Mild LVH, EF 35-40%, mid to distal anteroseptal, anterior and apical akinesis, moderate LAE.  She presents back to the [facility] ER today with a complaint of SOB for the last week. She denies any chest pain. Both her husband and daughter, who are both by her bedside, state that SOB seems to be her anginal equivalent. She reports daily compliance with her medications and adherence to a low sodium diet. She was instructed by Dr. Irish Lack, over the phone, to increase her Lasix dose to 80 mg, but she has had no improvement in symptoms. She also had to increase use of her home O2. She normally uses 2L only at night for COPD, but has had to use it during the day as well.   ROS:  Weakness, fatigue, insomnia  PMH:  Past Medical History  Diagnosis Date  . Acid reflux   . Hypertension   . Hypercholesterolemia   . Asthma   . Myocardial infarction 2014; 07/03/2013  . COPD (chronic obstructive pulmonary disease)   . Pneumonia     "as a child and again in 2013" (07/04/2013)  . Chronic bronchitis     "once or twice/yr" (07/04/2013)  . Pernicious anemia   . History of blood transfusion     "cause my blood was low" (07/04/2013)  . DZHGDJME(268.3)     "probably weekly" (07/04/2013)  . Acoustic neuroma     "Dr. Melissa Montane is testing for this in her right ear right now" (07/04/2013)  . Deafness in right ear   . Arthritis      "left knee; back" (07/04/2013)  . Lumbar compression fracture 2010; 2012    "fell; fell" (07/04/2013)  . Anxiety   . Chronic cystitis   . Chronic combined systolic and diastolic CHF (congestive heart failure)     Echo (11/2011): Mild LVH, EF 35-40%, mid to distal anteroseptal, anterior and apical akinesis, moderate LAE.  Marland Kitchen Coronary artery disease     non-STEMI in 06/2013. LHC (06/2013): Proximal LAD 90, mid LAD occluded, mid to distal LAD filled by left to left collaterals, lower pole of the diagonal occluded, proximal OM1 90, proximal to mid RCA 90, EF 40% apical and distal anterior hypokinesis. Patient declined bypass surgery. She ultimately underwent PCI of the OM1 (2.5 x 16 mm Promus DES);  PCI of RCA if uncontrolled symptoms     PSH: Past Surgical History  Procedure Laterality Date  . Abdominal surgery  1960's    "tumor removed" (07/04/2013)  .  Inguinal hernia repair Right   . Vesicovaginal fistula closure w/ tah    . Breast biopsy Left 1980's  . Knee arthroscopy Left 1980's  . Cataract extraction, bilateral Bilateral ~ 2011  . Tonsillectomy  1930's  . Cholecystectomy    . Appendectomy    . Bladder suspension  05/16/2005    Archie Endo 05/16/2005 (07/04/2013)  . Femoral-popliteal bypass graft Left 1990's  . Cardiac catheterization  2012  . Coronary angioplasty with stent placement  07/10/2013    "1" (07/10/2013)   I have reviewed the [place] and SH and  If appropriate update it with new information. Allergies  Allergen Reactions  . Sulfa Antibiotics Other (See Comments)    Breaking out   Scheduled Meds: . aspirin EC  81 mg Oral Daily  . clopidogrel  75 mg Oral Q breakfast  . furosemide  40 mg Intravenous Daily  . isosorbide mononitrate  30 mg Oral BID  . metoprolol tartrate  25 mg Oral BID  . pantoprazole  40 mg Oral Daily  . potassium chloride  20 mEq Oral BID  . simvastatin  10 mg Oral QHS  . tiotropium  18 mcg Inhalation Daily   Continuous Infusions:  PRN Meds:.acetaminophen,  acetaminophen, albuterol, alum & mag hydroxide-simeth, LORazepam, morphine injection, morphine CONCENTRATE, nitroGLYCERIN, ondansetron (ZOFRAN) IV, sodium chloride    BP 121/88  Pulse 79  Temp(Src) 97.4 F (36.3 C) (Oral)  Resp 21  Ht 5\' 2"  (1.575 m)  Wt 64.5 kg (142 lb 3.2 oz)  BMI 26.00 kg/m2  SpO2 100%   PPS:30% at best   Intake/Output Summary (Last 24 hours) at 12/20/13 0930 Last data filed at 12/20/13 0900  Gross per 24 hour  Intake    716 ml  Output    775 ml  Net    -59 ml    Physical Exam:  General: chronically ill appearing NAD HEENT:  Mm, no exudate Chest:   diminished  In bases CTA CVS: RRR, no murmur noted Abdomen:soft NT +BS Ext: without edema Neuro: alert and oriented X3  Labs: CBC    Component Value Date/Time   WBC 5.6 12/18/2013 0034   RBC 3.65* 12/18/2013 0034   HGB 9.8* 12/18/2013 0034   HCT 30.3* 12/18/2013 0034   PLT 207 12/18/2013 0034   MCV 83.0 12/18/2013 0034   MCH 26.8 12/18/2013 0034   MCHC 32.3 12/18/2013 0034   RDW 14.8 12/18/2013 0034   LYMPHSABS 0.5* 11/10/2013 0358   MONOABS 0.5 11/10/2013 0358   EOSABS 0.0 11/10/2013 0358   BASOSABS 0.0 11/10/2013 0358    BMET    Component Value Date/Time   NA 136* 12/20/2013 0326   K 5.2 12/20/2013 0326   CL 98 12/20/2013 0326   CO2 22 12/20/2013 0326   GLUCOSE 113* 12/20/2013 0326   BUN 38* 12/20/2013 0326   CREATININE 1.47* 12/20/2013 0326   CALCIUM 9.8 12/20/2013 0326   GFRNONAA 31* 12/20/2013 0326   GFRAA 36* 12/20/2013 0326    CMP     Component Value Date/Time   NA 136* 12/20/2013 0326   K 5.2 12/20/2013 0326   CL 98 12/20/2013 0326   CO2 22 12/20/2013 0326   GLUCOSE 113* 12/20/2013 0326   BUN 38* 12/20/2013 0326   CREATININE 1.47* 12/20/2013 0326   CALCIUM 9.8 12/20/2013 0326   PROT 7.0 12/17/2013 1330   ALBUMIN 3.7 12/17/2013 1330   AST 24 12/17/2013 1330   ALT 11 12/17/2013 1330   ALKPHOS 88 12/17/2013 1330  BILITOT 0.5 12/17/2013 1330   GFRNONAA 31* 12/20/2013 0326   GFRAA 36* 12/20/2013 0326      Time In Time Out Total Time Spent with Patient Total Overall Time  0900 1015 70 min 75 min    Greater than 50%  of this time was spent counseling and coordinating care related to the above assessment and plan.  Wadie Lessen NP  Palliative Medicine Team Team Phone # [phone] Pager [phone]   Discussed with Dr Burt Knack

## 2013-12-20 NOTE — Discharge Instructions (Signed)
Heart Healthy diet as tolerated.  Call if any questions or problems.  Hospice should arrange the oxygen and other meds to help with any pain.  I did do short prescription for ativan for anxiety.

## 2013-12-20 NOTE — Progress Notes (Signed)
   CARE MANAGEMENT NOTE 12/20/2013  Patient:  [name], [name]   Account Number:  [id number]  Date Initiated:  12/20/2013  Documentation initiated by:  [facility]  Subjective/Objective Assessment:   progression of CAD     Action/Plan:   Hospice   Anticipated DC Date:  12/20/2013   Anticipated DC Plan:  [place]  CM consult      [facility] Choice  HOME HEALTH   Choice offered to / List presented to:  C-4 Adult Children           Status of service:  Completed, signed off Medicare Important Message given?   (If response is "NO", the following Medicare IM given date fields will be blank) Date Medicare IM given:   Date Additional Medicare IM given:    Discharge Disposition:  [place]  Per UR Regulation:    If discussed at Long Length of Stay Meetings, dates discussed:    Comments:  12/20/2013 1030 NCM spoke to pt and gave permission to speak to dtr, Wallene Huh # [phone]. Dtr requested [facility]. The have RW, and bedside commode at home. Pt does not want hospital bed. [facility] fax # [phone], spoke to rep. Requested orders, H&P, and facesheet. Pt will need oxygen for home. She currently has oxygen at home with [facility]. Contacted [facility] and she does not have portability. She was set up with nocturnal oxygen only. Contacted Hospice to see if they can arrange oxygen delivery with [employer] (they contract with them for DME). Jonnie Finner RN CCM Case Mgmt phone [phone]

## 2013-12-20 NOTE — Discharge Summary (Signed)
Physician Discharge Summary       Patient ID: [name] MRN: [id number] DOB/AGE: [birth year] 80 y.o.  Admit date: 12/17/2013 Discharge date: 12/20/2013  Discharge Diagnoses:  Active Problems:   Coronary artery disease, significant disease, pt not wanting CABG, PCI would be very complex.  Plan for medical management and home hospice   Acute systolic heart failure   NSTEMI (non-ST elevated myocardial infarction)   Chronic kidney disease   DNR (do not resuscitate)   Discharged Condition: fair  Procedures: 12/18/13 cardiac cath by Dr. Laurena Bering Course: 80 y.o. Female, followed by Dr. Irish Lack, with a history of CAD, ischemic cardiomyopathy, combined systolic and diastolic CHF, HTN, COPD, HL, CKD, prior DVT (not on Coumadin). Echo in 02/2013 demonstrated an EF of 40-50%. She has been hospitalized several times over the last several months. She suffered a non-STEMI in 06/2013. LHC (06/2013): Proximal LAD 90, mid LAD occluded, mid to distal LAD filled by left to left collaterals, lower pole of the diagonal occluded, proximal OM1 90, proximal to mid RCA 90, EF 40% apical and distal anterior hypokinesis. Patient declined bypass surgery. She ultimately underwent PCI of the OM1 (2.5 x 16 mm Promus DES). Medical therapy was recommended for her LAD and RCA disease  She was most recently admitted from 1/4-1/7 with a non-STEMI in the setting of acute on chronic systolic and diastolic CHF. This was likely felt to be a type II non-STEMI. Her prior angiograms were reviewed and it was felt that PCI of the RCA could be considered first if she had ongoing symptoms. Referral for CABG would be a last resort. She was stable on medical therapy at discharge. Echo (11/2011): Mild LVH, EF 35-40%, mid to distal anteroseptal, anterior and apical akinesis, moderate LAE.   She presented back to the [facility] ER 10/17/14  with a complaint of SOB for the last week. She denies any chest pain. Both her husband and daughter,  who are both by her bedside, state that SOB seems to be her anginal equivalent. She reports daily compliance with her medications and adherence to a low sodium diet. She was instructed by Dr. Irish Lack, over the phone, to increase her Lasix dose to 80 mg, but she has had no improvement in symptoms. She also had to increase use of her home O2. She normally uses 2L only at night for COPD, but has had to use it during the day as well  She was diuresed and after discussion it was felt cardiac cath would help determine care approach.  Cath by Dr. Tamala Julian found: IMPRESSIONS: 1. Severe three-vessel coronary artery disease with total occlusion of proximal to mid LAD, subtotal occlusion of mid to distal circumflex beyond previously placed stent, and subtotal occlusion of the proximal and distal RCA. The LAD, distal circumflex, and RCA are collateralized although collaterals are not mature.  2. Left ventriculography was not performed  Discussions were made with pt and her family a care plan.  She nor family wished her to undergo CABG.  Dr. Tamala Julian felt percutaneous intervention would need to be a three-vessel procedure with proximal LAD stent, distal circumflex stent, and multiple stents in the right coronary artery. Given the patient's renal function, age, and aggressive nature of her disease, the most prudent approach would seem to be medical therapy with consideration of hospice and palliative care.  The pt's daughter is a Marine scientist and she with family agreed to discuss with palliative care.  Medication was optimized and she continued to be  diuresed.  Echo found her EF to be 35-40%, with mild concentric hypertrophy.   By 12/20/13 she was seen by the palliative care team and plans were made for discharge home to hospice care, with oxygen.  Her statin was stopped.  Her antianginal meds continued as well as her lasix, K+ stopped due to elevation.  She is a DNR.  Home DNR form was signed as well.  She was seen and evaluated by  Dr. Burt Knack prior to discharge and found ready for discharge.   Consults: Palliative care  Significant Diagnostic Studies:  BMET    Component Value Date/Time   NA 136* 12/20/2013 0326   K 5.2 12/20/2013 0326   CL 98 12/20/2013 0326   CO2 22 12/20/2013 0326   GLUCOSE 113* 12/20/2013 0326   BUN 38* 12/20/2013 0326   CREATININE 1.47* 12/20/2013 0326   CALCIUM 9.8 12/20/2013 0326   GFRNONAA 31* 12/20/2013 0326   GFRAA 36* 12/20/2013 0326    CBC    Component Value Date/Time   WBC 5.6 12/18/2013 0034   RBC 3.65* 12/18/2013 0034   HGB 9.8* 12/18/2013 0034   HCT 30.3* 12/18/2013 0034   PLT 207 12/18/2013 0034   MCV 83.0 12/18/2013 0034   MCH 26.8 12/18/2013 0034   MCHC 32.3 12/18/2013 0034   RDW 14.8 12/18/2013 0034   LYMPHSABS 0.5* 11/10/2013 0358   MONOABS 0.5 11/10/2013 0358   EOSABS 0.0 11/10/2013 0358   BASOSABS 0.0 11/10/2013 0358   Troponin 1.05 down to 0.56 at discharge.  T CHol 112, TG 212, HDL 29, LDL 41   BNP (last 3 results)  Recent Labs  11/22/13 0940 12/09/13 0901 12/17/13 1330  PROBNP 405.0* 2164.0* 9428.0*    U/A positive nitrites.  MHD:[id number]: Stable cardiomegaly with mild diffuse pulmonary vascular congestion and small bilateral pleural effusions, suggestive of acute CHF exacerbation.     Discharge Exam: Blood pressure 121/88, pulse 79, temperature 97.4 F (36.3 C), temperature source Oral, resp. rate 21, height 5\' 2"  (1.575 m), weight 142 lb 3.2 oz (64.5 kg), SpO2 100.00%.    Disposition: 01-Home or Self Care       Future Appointments Provider Department Dept Phone   01/09/2014 8:15 AM Casandra Doffing, MD [facility] [phone]       Medication List    STOP taking these medications       OVER THE COUNTER MEDICATION     potassium chloride 20 MEQ packet  Commonly known as:  KLOR-CON     pravastatin 20 MG tablet  Commonly known as:  PRAVACHOL     Simethicone Liqd     Vitamin D 1000 UNITS capsule      TAKE these medications         acetaminophen 325 MG tablet  Commonly known as:  TYLENOL  Take 325 mg by mouth 2 (two) times daily as needed for headache (pain).     albuterol 108 (90 BASE) MCG/ACT inhaler  Commonly known as:  PROVENTIL HFA;VENTOLIN HFA  Inhale 2 puffs into the lungs every 6 (six) hours as needed for wheezing.     aspirin EC 81 MG tablet  Take 81 mg by mouth daily.     clopidogrel 75 MG tablet  Commonly known as:  PLAVIX  Take 75 mg by mouth daily with breakfast.     furosemide 40 MG tablet  Commonly known as:  LASIX  Take 2 tablets (80 mg total) by mouth daily.     isosorbide mononitrate  30 MG 24 hr tablet  Commonly known as:  IMDUR  Take 30 mg by mouth 2 (two) times daily.     LORazepam 0.5 MG tablet  Commonly known as:  ATIVAN  Take 1 tablet (0.5 mg total) by mouth every 4 (four) hours as needed for anxiety.     metoprolol tartrate 25 MG tablet  Commonly known as:  LOPRESSOR  Take 25 mg by mouth 2 (two) times daily.     nitroGLYCERIN 0.4 MG SL tablet  Commonly known as:  NITROSTAT  Place 0.4 mg under the tongue every 5 (five) minutes as needed.     pantoprazole 40 MG tablet  Commonly known as:  PROTONIX  Take 40 mg by mouth daily.     sodium chloride 0.65 % Soln nasal spray  Commonly known as:  OCEAN  Place 1 spray into both nostrils as needed for congestion.     tiotropium 18 MCG inhalation capsule  Commonly known as:  SPIRIVA  Place 18 mcg into inhaler and inhale daily.     VITAMIN B-12 IJ  Inject 1 mL as directed every 3 (three) months. Last dose 12/10/13       Follow-up Information   Follow up with [name]., MD. (call for appt if needed)    Specialty:  Cardiology   Contact information:   [id number] [place] [address] [phone]        Discharge Instructions: Heart Healthy diet as tolerated.  Call if any questions or problems.  Hospice should arrange the oxygen and other meds to help with any pain.  I did do short  prescription for ativan for anxiety.     Signed: Isaiah Serge Nurse Practitioner-Certified [facility]: [facility] 12/20/2013, 12:51 PM  Time spent on discharge : >30 minutes.

## 2013-12-20 NOTE — Progress Notes (Signed)
Pt d/c'd home with home hospice. D/c instructions reviewed with daughter and all verbalize understanding. Pt d/c'd home with oxygen. All belongings with pt. Iv d/c'd and catheter intact. Pt in NSR. No new complaints at this time. Pt denies CP, SOB, or any other complaints.

## 2013-12-23 ENCOUNTER — Other Ambulatory Visit: Payer: Medicare HMO

## 2014-01-04 NOTE — Consult Note (Signed)
I have reviewed and discussed the care of this patient in detail with the nurse practitioner including pertinent patient records, physical exam findings and data. I agree with details of this encounter.  

## 2014-01-09 ENCOUNTER — Ambulatory Visit: Payer: Medicare HMO | Admitting: Interventional Cardiology

## 2014-01-09 ENCOUNTER — Telehealth: Payer: Self-pay | Admitting: Interventional Cardiology

## 2014-01-09 NOTE — Telephone Encounter (Signed)
Wallene Huh [phone]) Dropped Off FMLA to Care For pt, this Was Given to Amy/Dr.Varanasi For Completion

## 2014-01-16 ENCOUNTER — Telehealth: Payer: Self-pay | Admitting: Interventional Cardiology

## 2014-01-16 NOTE — Telephone Encounter (Signed)
FMLA signed By Dr.J. Irish Lack faxed to [facility] @ [phone], Wallene Huh ( Daughter For FMLA)  Aware and She Will pick up.

## 2014-02-05 DEATH — deceased

## 2014-10-09 IMAGING — CR DG CHEST 1V PORT
1 series · 1 of 1 positions shown · non-contrast
Comparison: 11/13/2012

CLINICAL DATA: Respiratory distress.  Shortness of breath.

PORTABLE CHEST - 1 VIEW

[AP]
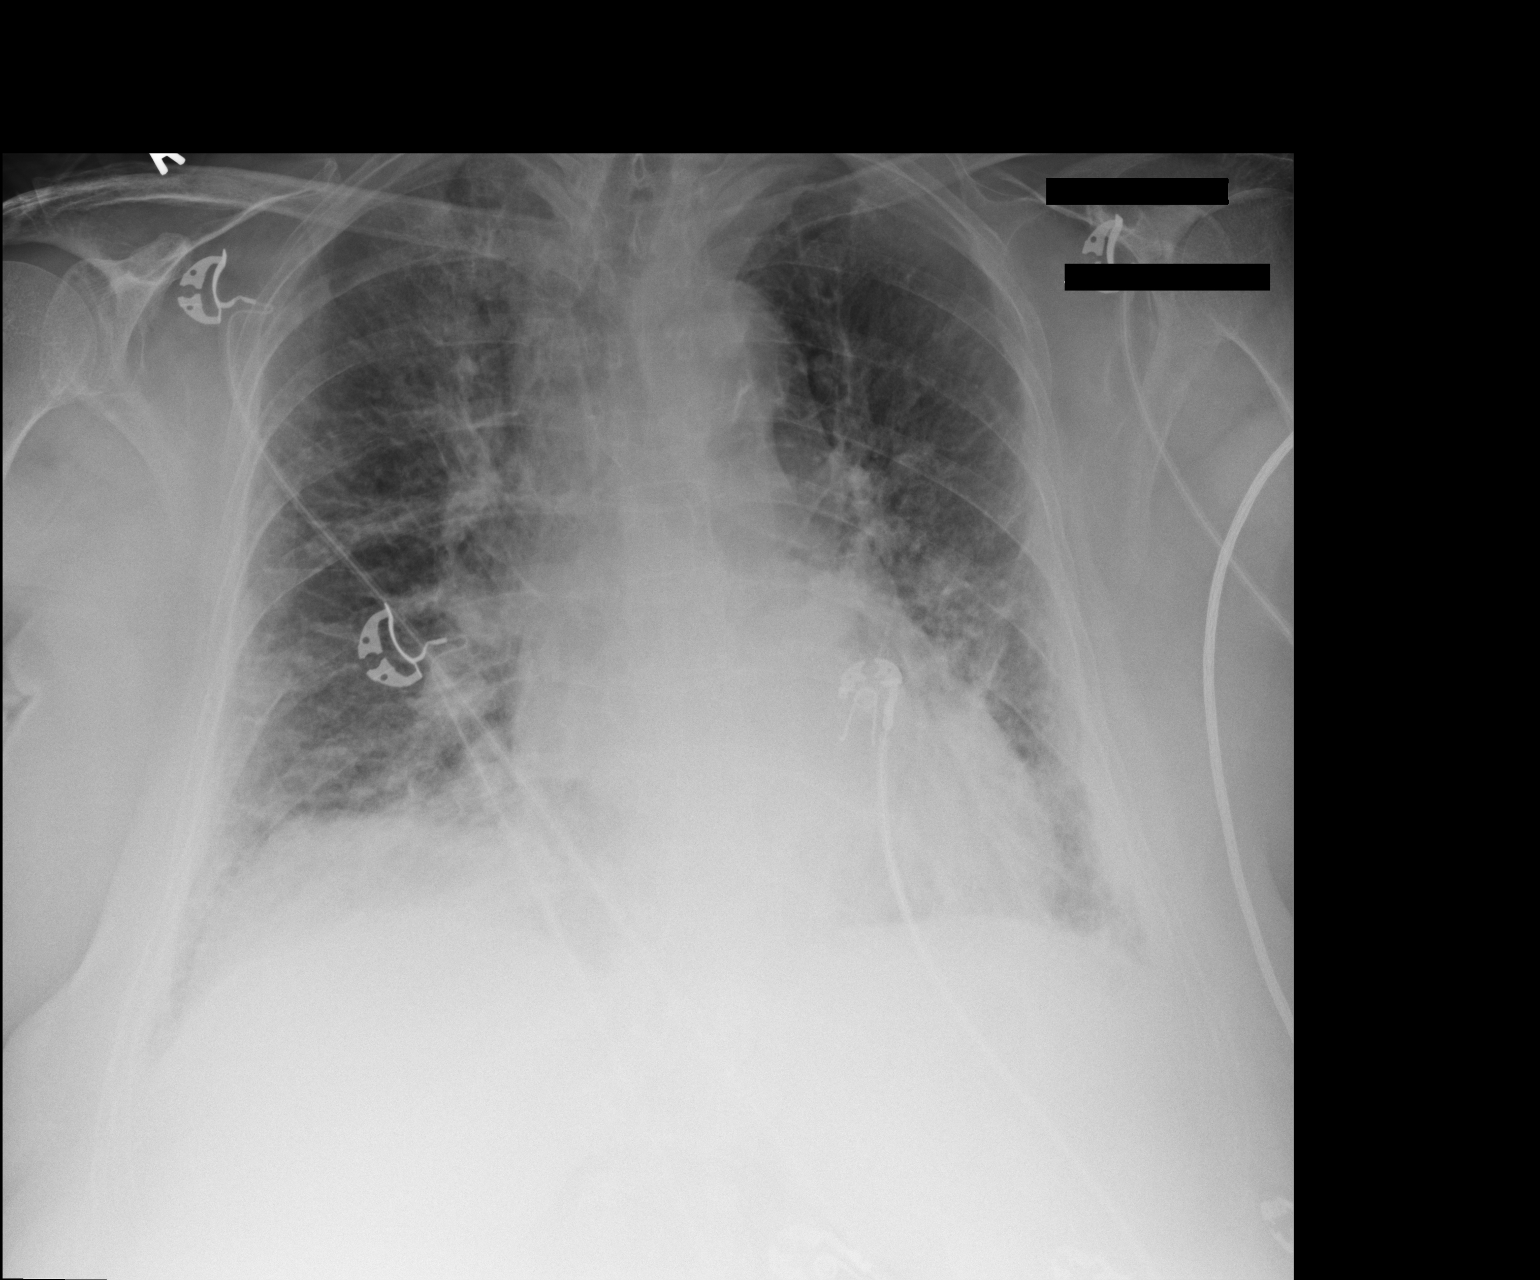

[1 of 1 positions shown; findings below may reference images not displayed]

FINDINGS: Enlargement with increased pulmonary vascularity.  New
interstitial changes suggesting interstitial edema.  No blunting of
costophrenic angles.  No pneumothorax.  Calcified and tortuous
aorta.
IMPRESSION: Interval development of cardiac enlargement with pulmonary vascular
congestion and interstitial edema.

## 2014-10-16 ENCOUNTER — Encounter (HOSPITAL_COMMUNITY): Payer: Self-pay | Admitting: Interventional Cardiology
